# Patient Record
Sex: Female | Born: 1950 | Race: Black or African American | Hispanic: No | Smoking: Current every day smoker
Health system: Southern US, Community
[De-identification: ages and names within clinical notes are randomized; demographics above are authoritative.]

## PROBLEM LIST (undated history)

## (undated) DIAGNOSIS — Z720 Tobacco use: Secondary | ICD-10-CM

## (undated) DIAGNOSIS — F32A Depression, unspecified: Secondary | ICD-10-CM

## (undated) DIAGNOSIS — K219 Gastro-esophageal reflux disease without esophagitis: Secondary | ICD-10-CM

## (undated) DIAGNOSIS — F329 Major depressive disorder, single episode, unspecified: Secondary | ICD-10-CM

## (undated) DIAGNOSIS — E079 Disorder of thyroid, unspecified: Secondary | ICD-10-CM

## (undated) DIAGNOSIS — M1990 Unspecified osteoarthritis, unspecified site: Secondary | ICD-10-CM

## (undated) DIAGNOSIS — I10 Essential (primary) hypertension: Secondary | ICD-10-CM

## (undated) HISTORY — PX: EYE SURGERY: SHX253

## (undated) HISTORY — DX: Depression, unspecified: F32.A

## (undated) HISTORY — DX: Gastro-esophageal reflux disease without esophagitis: K21.9

## (undated) HISTORY — DX: Tobacco use: Z72.0

## (undated) HISTORY — DX: Major depressive disorder, single episode, unspecified: F32.9

## (undated) HISTORY — DX: Unspecified osteoarthritis, unspecified site: M19.90

---

## 1999-01-29 ENCOUNTER — Inpatient Hospital Stay (HOSPITAL_COMMUNITY): Admission: EM | Admit: 1999-01-29 | Discharge: 1999-02-02 | Payer: Self-pay | Admitting: Emergency Medicine

## 1999-02-22 ENCOUNTER — Emergency Department (HOSPITAL_COMMUNITY): Admission: EM | Admit: 1999-02-22 | Discharge: 1999-02-22 | Payer: Self-pay | Admitting: Emergency Medicine

## 1999-04-08 ENCOUNTER — Encounter: Admission: RE | Admit: 1999-04-08 | Discharge: 1999-04-28 | Payer: Self-pay | Admitting: *Deleted

## 1999-11-24 ENCOUNTER — Emergency Department (HOSPITAL_COMMUNITY): Admission: EM | Admit: 1999-11-24 | Discharge: 1999-11-24 | Payer: Self-pay | Admitting: Emergency Medicine

## 2003-07-22 ENCOUNTER — Inpatient Hospital Stay (HOSPITAL_COMMUNITY): Admission: AD | Admit: 2003-07-22 | Discharge: 2003-07-26 | Payer: Self-pay | Admitting: Psychiatry

## 2004-09-30 ENCOUNTER — Ambulatory Visit: Payer: Self-pay | Admitting: Internal Medicine

## 2004-10-01 ENCOUNTER — Ambulatory Visit: Payer: Self-pay | Admitting: Internal Medicine

## 2004-12-01 ENCOUNTER — Ambulatory Visit: Payer: Self-pay | Admitting: Family Medicine

## 2004-12-02 ENCOUNTER — Ambulatory Visit: Payer: Self-pay | Admitting: *Deleted

## 2005-01-07 ENCOUNTER — Ambulatory Visit: Payer: Self-pay | Admitting: Family Medicine

## 2005-01-19 ENCOUNTER — Ambulatory Visit (HOSPITAL_COMMUNITY): Admission: RE | Admit: 2005-01-19 | Discharge: 2005-01-19 | Payer: Self-pay | Admitting: Family Medicine

## 2005-02-11 ENCOUNTER — Ambulatory Visit: Payer: Self-pay | Admitting: Family Medicine

## 2005-02-15 ENCOUNTER — Ambulatory Visit: Payer: Self-pay | Admitting: Family Medicine

## 2005-06-20 ENCOUNTER — Ambulatory Visit: Payer: Self-pay | Admitting: Family Medicine

## 2006-01-02 ENCOUNTER — Ambulatory Visit: Payer: Self-pay | Admitting: Family Medicine

## 2006-01-02 ENCOUNTER — Ambulatory Visit (HOSPITAL_COMMUNITY): Admission: RE | Admit: 2006-01-02 | Discharge: 2006-01-02 | Payer: Self-pay | Admitting: Family Medicine

## 2006-01-03 ENCOUNTER — Ambulatory Visit: Payer: Self-pay | Admitting: Family Medicine

## 2006-01-11 ENCOUNTER — Ambulatory Visit: Payer: Self-pay | Admitting: Family Medicine

## 2006-01-13 ENCOUNTER — Ambulatory Visit: Payer: Self-pay | Admitting: Family Medicine

## 2006-01-23 ENCOUNTER — Ambulatory Visit: Payer: Self-pay | Admitting: Family Medicine

## 2006-04-11 ENCOUNTER — Ambulatory Visit: Payer: Self-pay | Admitting: Family Medicine

## 2006-04-25 ENCOUNTER — Ambulatory Visit: Payer: Self-pay | Admitting: Family Medicine

## 2006-07-25 ENCOUNTER — Ambulatory Visit: Payer: Self-pay | Admitting: Family Medicine

## 2006-07-25 ENCOUNTER — Encounter (INDEPENDENT_AMBULATORY_CARE_PROVIDER_SITE_OTHER): Payer: Self-pay | Admitting: Family Medicine

## 2006-08-15 ENCOUNTER — Ambulatory Visit (HOSPITAL_COMMUNITY): Admission: RE | Admit: 2006-08-15 | Discharge: 2006-08-15 | Payer: Self-pay | Admitting: Family Medicine

## 2007-04-21 ENCOUNTER — Encounter (INDEPENDENT_AMBULATORY_CARE_PROVIDER_SITE_OTHER): Payer: Self-pay | Admitting: Family Medicine

## 2007-04-21 DIAGNOSIS — E039 Hypothyroidism, unspecified: Secondary | ICD-10-CM

## 2007-04-21 DIAGNOSIS — F329 Major depressive disorder, single episode, unspecified: Secondary | ICD-10-CM

## 2007-04-21 DIAGNOSIS — J309 Allergic rhinitis, unspecified: Secondary | ICD-10-CM | POA: Insufficient documentation

## 2007-04-21 DIAGNOSIS — N951 Menopausal and female climacteric states: Secondary | ICD-10-CM

## 2007-05-28 ENCOUNTER — Ambulatory Visit: Payer: Self-pay | Admitting: Family Medicine

## 2007-07-23 ENCOUNTER — Ambulatory Visit: Payer: Self-pay | Admitting: Family Medicine

## 2007-08-14 ENCOUNTER — Ambulatory Visit: Payer: Self-pay | Admitting: Family Medicine

## 2007-08-29 ENCOUNTER — Encounter (INDEPENDENT_AMBULATORY_CARE_PROVIDER_SITE_OTHER): Payer: Self-pay | Admitting: *Deleted

## 2007-11-20 ENCOUNTER — Ambulatory Visit: Payer: Self-pay | Admitting: Family Medicine

## 2007-11-20 DIAGNOSIS — I739 Peripheral vascular disease, unspecified: Secondary | ICD-10-CM | POA: Insufficient documentation

## 2007-11-27 ENCOUNTER — Encounter (INDEPENDENT_AMBULATORY_CARE_PROVIDER_SITE_OTHER): Payer: Self-pay | Admitting: Family Medicine

## 2007-11-27 ENCOUNTER — Ambulatory Visit: Payer: Self-pay | Admitting: Surgery

## 2007-11-27 ENCOUNTER — Ambulatory Visit (HOSPITAL_COMMUNITY): Admission: RE | Admit: 2007-11-27 | Discharge: 2007-11-27 | Payer: Self-pay | Admitting: Family Medicine

## 2007-12-25 ENCOUNTER — Ambulatory Visit: Payer: Self-pay | Admitting: Family Medicine

## 2007-12-25 DIAGNOSIS — M79609 Pain in unspecified limb: Secondary | ICD-10-CM

## 2007-12-26 ENCOUNTER — Ambulatory Visit (HOSPITAL_COMMUNITY): Admission: RE | Admit: 2007-12-26 | Discharge: 2007-12-26 | Payer: Self-pay | Admitting: Family Medicine

## 2007-12-26 ENCOUNTER — Encounter (INDEPENDENT_AMBULATORY_CARE_PROVIDER_SITE_OTHER): Payer: Self-pay | Admitting: Family Medicine

## 2007-12-26 ENCOUNTER — Ambulatory Visit: Payer: Self-pay | Admitting: Vascular Surgery

## 2007-12-26 ENCOUNTER — Encounter (INDEPENDENT_AMBULATORY_CARE_PROVIDER_SITE_OTHER): Payer: Self-pay | Admitting: Ophthalmology

## 2008-01-02 ENCOUNTER — Ambulatory Visit: Payer: Self-pay | Admitting: Internal Medicine

## 2008-01-02 ENCOUNTER — Encounter (INDEPENDENT_AMBULATORY_CARE_PROVIDER_SITE_OTHER): Payer: Self-pay | Admitting: Family Medicine

## 2008-02-19 ENCOUNTER — Ambulatory Visit: Payer: Self-pay | Admitting: Family Medicine

## 2008-02-25 ENCOUNTER — Ambulatory Visit (HOSPITAL_COMMUNITY): Admission: RE | Admit: 2008-02-25 | Discharge: 2008-02-25 | Payer: Self-pay | Admitting: Family Medicine

## 2008-03-05 ENCOUNTER — Ambulatory Visit: Payer: Self-pay | Admitting: Nurse Practitioner

## 2008-04-01 ENCOUNTER — Emergency Department (HOSPITAL_COMMUNITY): Admission: EM | Admit: 2008-04-01 | Discharge: 2008-04-02 | Payer: Self-pay | Admitting: Emergency Medicine

## 2008-04-23 ENCOUNTER — Ambulatory Visit: Payer: Self-pay | Admitting: Nurse Practitioner

## 2008-04-23 DIAGNOSIS — I10 Essential (primary) hypertension: Secondary | ICD-10-CM

## 2008-04-23 DIAGNOSIS — R519 Headache, unspecified: Secondary | ICD-10-CM | POA: Insufficient documentation

## 2008-04-23 DIAGNOSIS — R51 Headache: Secondary | ICD-10-CM

## 2008-04-25 ENCOUNTER — Ambulatory Visit: Payer: Self-pay | Admitting: Family Medicine

## 2008-04-30 ENCOUNTER — Telehealth (INDEPENDENT_AMBULATORY_CARE_PROVIDER_SITE_OTHER): Payer: Self-pay | Admitting: Family Medicine

## 2008-06-19 ENCOUNTER — Ambulatory Visit: Payer: Self-pay | Admitting: Nurse Practitioner

## 2008-06-20 LAB — CONVERTED CEMR LAB
ALT: 38 units/L — ABNORMAL HIGH (ref 0–35)
Albumin: 4.3 g/dL (ref 3.5–5.2)
BUN: 15 mg/dL (ref 6–23)
Basophils Absolute: 0 10*3/uL (ref 0.0–0.1)
Calcium: 8.7 mg/dL (ref 8.4–10.5)
Creatinine, Ser: 0.84 mg/dL (ref 0.40–1.20)
Lymphocytes Relative: 33 % (ref 12–46)
Lymphs Abs: 2.5 10*3/uL (ref 0.7–4.0)
MCHC: 32.2 g/dL (ref 30.0–36.0)
Monocytes Absolute: 0.8 10*3/uL (ref 0.1–1.0)
Monocytes Relative: 11 % (ref 3–12)
Neutro Abs: 4.1 10*3/uL (ref 1.7–7.7)
Neutrophils Relative %: 54 % (ref 43–77)
Platelets: 161 10*3/uL (ref 150–400)
Potassium: 4.2 meq/L (ref 3.5–5.3)
RBC: 4.06 M/uL (ref 3.87–5.11)
TSH: 0.728 microintl units/mL (ref 0.350–4.50)
Total Bilirubin: 0.4 mg/dL (ref 0.3–1.2)
Total Protein: 7.3 g/dL (ref 6.0–8.3)

## 2008-09-01 ENCOUNTER — Ambulatory Visit: Payer: Self-pay | Admitting: Nurse Practitioner

## 2008-09-01 DIAGNOSIS — M545 Low back pain, unspecified: Secondary | ICD-10-CM | POA: Insufficient documentation

## 2008-09-05 ENCOUNTER — Encounter (INDEPENDENT_AMBULATORY_CARE_PROVIDER_SITE_OTHER): Payer: Self-pay | Admitting: Family Medicine

## 2008-10-06 ENCOUNTER — Ambulatory Visit (HOSPITAL_COMMUNITY): Admission: RE | Admit: 2008-10-06 | Discharge: 2008-10-06 | Payer: Self-pay | Admitting: Family Medicine

## 2008-10-08 ENCOUNTER — Ambulatory Visit: Payer: Self-pay | Admitting: Nurse Practitioner

## 2008-10-08 DIAGNOSIS — D239 Other benign neoplasm of skin, unspecified: Secondary | ICD-10-CM | POA: Insufficient documentation

## 2008-10-28 ENCOUNTER — Telehealth (INDEPENDENT_AMBULATORY_CARE_PROVIDER_SITE_OTHER): Payer: Self-pay | Admitting: Nurse Practitioner

## 2008-11-11 ENCOUNTER — Ambulatory Visit: Payer: Self-pay | Admitting: Family Medicine

## 2008-12-08 ENCOUNTER — Encounter (INDEPENDENT_AMBULATORY_CARE_PROVIDER_SITE_OTHER): Payer: Self-pay | Admitting: Nurse Practitioner

## 2008-12-08 ENCOUNTER — Telehealth (INDEPENDENT_AMBULATORY_CARE_PROVIDER_SITE_OTHER): Payer: Self-pay | Admitting: *Deleted

## 2008-12-08 ENCOUNTER — Ambulatory Visit: Payer: Self-pay | Admitting: Family Medicine

## 2008-12-08 DIAGNOSIS — J069 Acute upper respiratory infection, unspecified: Secondary | ICD-10-CM | POA: Insufficient documentation

## 2008-12-08 DIAGNOSIS — M940 Chondrocostal junction syndrome [Tietze]: Secondary | ICD-10-CM | POA: Insufficient documentation

## 2008-12-22 ENCOUNTER — Telehealth (INDEPENDENT_AMBULATORY_CARE_PROVIDER_SITE_OTHER): Payer: Self-pay | Admitting: Family Medicine

## 2008-12-23 ENCOUNTER — Ambulatory Visit: Payer: Self-pay | Admitting: Family Medicine

## 2008-12-23 ENCOUNTER — Telehealth (INDEPENDENT_AMBULATORY_CARE_PROVIDER_SITE_OTHER): Payer: Self-pay | Admitting: *Deleted

## 2008-12-23 DIAGNOSIS — J209 Acute bronchitis, unspecified: Secondary | ICD-10-CM

## 2008-12-23 DIAGNOSIS — B351 Tinea unguium: Secondary | ICD-10-CM

## 2008-12-25 ENCOUNTER — Ambulatory Visit: Payer: Self-pay | Admitting: Internal Medicine

## 2008-12-25 ENCOUNTER — Ambulatory Visit: Payer: Self-pay | Admitting: Cardiology

## 2008-12-25 ENCOUNTER — Inpatient Hospital Stay (HOSPITAL_COMMUNITY): Admission: EM | Admit: 2008-12-25 | Discharge: 2008-12-26 | Payer: Self-pay | Admitting: Gastroenterology

## 2008-12-26 ENCOUNTER — Encounter (INDEPENDENT_AMBULATORY_CARE_PROVIDER_SITE_OTHER): Payer: Self-pay | Admitting: Internal Medicine

## 2009-02-13 ENCOUNTER — Telehealth (INDEPENDENT_AMBULATORY_CARE_PROVIDER_SITE_OTHER): Payer: Self-pay | Admitting: Family Medicine

## 2009-03-02 ENCOUNTER — Ambulatory Visit: Payer: Self-pay | Admitting: Family Medicine

## 2009-06-24 ENCOUNTER — Encounter (INDEPENDENT_AMBULATORY_CARE_PROVIDER_SITE_OTHER): Payer: Self-pay | Admitting: *Deleted

## 2009-07-06 ENCOUNTER — Ambulatory Visit: Payer: Self-pay | Admitting: Physician Assistant

## 2009-07-06 DIAGNOSIS — M25519 Pain in unspecified shoulder: Secondary | ICD-10-CM | POA: Insufficient documentation

## 2009-07-07 ENCOUNTER — Ambulatory Visit (HOSPITAL_COMMUNITY): Admission: RE | Admit: 2009-07-07 | Discharge: 2009-07-07 | Payer: Self-pay | Admitting: Physician Assistant

## 2009-10-21 ENCOUNTER — Telehealth: Payer: Self-pay | Admitting: Physician Assistant

## 2009-11-04 ENCOUNTER — Ambulatory Visit: Payer: Self-pay | Admitting: Physician Assistant

## 2009-11-04 DIAGNOSIS — L738 Other specified follicular disorders: Secondary | ICD-10-CM | POA: Insufficient documentation

## 2009-11-04 LAB — CONVERTED CEMR LAB
CO2: 25 meq/L (ref 19–32)
Chloride: 108 meq/L (ref 96–112)
Potassium: 4.3 meq/L (ref 3.5–5.3)
Sodium: 144 meq/L (ref 135–145)

## 2009-11-10 ENCOUNTER — Encounter: Payer: Self-pay | Admitting: Physician Assistant

## 2009-11-10 ENCOUNTER — Telehealth: Payer: Self-pay | Admitting: Physician Assistant

## 2009-11-13 ENCOUNTER — Ambulatory Visit (HOSPITAL_COMMUNITY): Admission: RE | Admit: 2009-11-13 | Discharge: 2009-11-13 | Payer: Self-pay | Admitting: Internal Medicine

## 2009-11-23 ENCOUNTER — Encounter: Admission: RE | Admit: 2009-11-23 | Discharge: 2009-11-23 | Payer: Self-pay | Admitting: Internal Medicine

## 2009-11-23 ENCOUNTER — Encounter: Payer: Self-pay | Admitting: Physician Assistant

## 2009-12-14 ENCOUNTER — Encounter: Payer: Self-pay | Admitting: Physician Assistant

## 2009-12-14 ENCOUNTER — Encounter: Admission: RE | Admit: 2009-12-14 | Discharge: 2010-01-20 | Payer: Self-pay | Admitting: Physician Assistant

## 2010-01-06 ENCOUNTER — Encounter: Payer: Self-pay | Admitting: Physician Assistant

## 2010-01-20 ENCOUNTER — Encounter: Payer: Self-pay | Admitting: Physician Assistant

## 2010-01-22 ENCOUNTER — Encounter: Payer: Self-pay | Admitting: Physician Assistant

## 2010-02-26 ENCOUNTER — Telehealth: Payer: Self-pay | Admitting: Physician Assistant

## 2010-02-26 ENCOUNTER — Ambulatory Visit: Payer: Self-pay | Admitting: Physician Assistant

## 2010-02-26 DIAGNOSIS — R109 Unspecified abdominal pain: Secondary | ICD-10-CM | POA: Insufficient documentation

## 2010-02-26 LAB — CONVERTED CEMR LAB
Glucose, Urine, Semiquant: NEGATIVE
Ketones, urine, test strip: NEGATIVE
Specific Gravity, Urine: >=1.03
Urobilinogen, UA: 0.2
WBC Urine, dipstick: NEGATIVE
pH: 6

## 2010-02-27 LAB — CONVERTED CEMR LAB
ALT: 9 units/L (ref 0–35)
AST: 14 units/L (ref 0–37)
BUN: 18 mg/dL (ref 6–23)
Basophils Relative: 0 % (ref 0–1)
Calcium: 10.1 mg/dL (ref 8.4–10.5)
Creatinine, Ser: 1.05 mg/dL (ref 0.40–1.20)
Eosinophils Relative: 2 % (ref 0–5)
Glucose, Bld: 79 mg/dL (ref 70–99)
HCT: 44.5 % (ref 36.0–46.0)
Hemoglobin: 14.5 g/dL (ref 12.0–15.0)
Lymphocytes Relative: 46 % (ref 12–46)
Lymphs Abs: 3.8 10*3/uL (ref 0.7–4.0)
MCHC: 32.6 g/dL (ref 30.0–36.0)
Potassium: 4.9 meq/L (ref 3.5–5.3)
RBC: 4.21 M/uL (ref 3.87–5.11)
RDW: 13.6 % (ref 11.5–15.5)
Total Protein: 8 g/dL (ref 6.0–8.3)
WBC: 8.3 10*3/uL (ref 4.0–10.5)

## 2010-03-01 ENCOUNTER — Ambulatory Visit (HOSPITAL_COMMUNITY): Admission: RE | Admit: 2010-03-01 | Discharge: 2010-03-01 | Payer: Self-pay | Admitting: Internal Medicine

## 2010-03-01 ENCOUNTER — Telehealth: Payer: Self-pay | Admitting: Physician Assistant

## 2010-03-02 ENCOUNTER — Encounter: Payer: Self-pay | Admitting: Physician Assistant

## 2010-03-02 ENCOUNTER — Encounter (INDEPENDENT_AMBULATORY_CARE_PROVIDER_SITE_OTHER): Payer: Self-pay | Admitting: *Deleted

## 2010-03-02 LAB — CONVERTED CEMR LAB
Folate: 6.3 ng/mL
Vitamin B-12: 333 pg/mL (ref 211–911)

## 2010-03-04 ENCOUNTER — Encounter: Payer: Self-pay | Admitting: Physician Assistant

## 2010-04-15 ENCOUNTER — Telehealth: Payer: Self-pay | Admitting: Physician Assistant

## 2010-06-22 ENCOUNTER — Telehealth: Payer: Self-pay | Admitting: Physician Assistant

## 2010-06-28 ENCOUNTER — Encounter (INDEPENDENT_AMBULATORY_CARE_PROVIDER_SITE_OTHER): Payer: Self-pay | Admitting: *Deleted

## 2010-07-12 ENCOUNTER — Ambulatory Visit: Payer: Self-pay | Admitting: Physician Assistant

## 2010-07-12 ENCOUNTER — Telehealth: Payer: Self-pay | Admitting: Physician Assistant

## 2010-07-12 LAB — CONVERTED CEMR LAB: Calcium: 10.1 mg/dL (ref 8.4–10.5)

## 2010-07-13 ENCOUNTER — Encounter: Payer: Self-pay | Admitting: Physician Assistant

## 2010-07-13 ENCOUNTER — Telehealth: Payer: Self-pay | Admitting: Physician Assistant

## 2010-07-28 ENCOUNTER — Encounter (INDEPENDENT_AMBULATORY_CARE_PROVIDER_SITE_OTHER): Payer: Self-pay | Admitting: *Deleted

## 2010-07-28 ENCOUNTER — Ambulatory Visit: Payer: Self-pay | Admitting: Sports Medicine

## 2010-07-28 DIAGNOSIS — M719 Bursopathy, unspecified: Secondary | ICD-10-CM

## 2010-07-28 DIAGNOSIS — M67919 Unspecified disorder of synovium and tendon, unspecified shoulder: Secondary | ICD-10-CM | POA: Insufficient documentation

## 2010-09-01 ENCOUNTER — Encounter: Payer: Self-pay | Admitting: Physician Assistant

## 2011-01-02 ENCOUNTER — Encounter: Payer: Self-pay | Admitting: Occupational Therapy

## 2011-01-11 NOTE — Progress Notes (Signed)
Summary: [facility] Medicine [facility] referral   Phone Note Outgoing Call   Summary of Call: Needs referral to [facility] Medicine Clinic for chronic left shoulder pain. Initial call taken by: Brynda Rim,  July 12, 2010 5:52 PM  Follow-up for Phone Call        PT HAVE AN APPT [facility] SPORTS MEDICINE 07-19-10 @ 2:30PM DR LEE  PH # [phone] ADDRESS [address] . LVM TO PT 2 CALL ME BACK. Follow-up by: Cheryll Dessert,  July 16, 2010 10:06 AM

## 2011-01-11 NOTE — Letter (Signed)
Summary: *Consult Note  [facility]  [address]   [place], [place] [address]   Phone: [phone]  Fax: [phone]    Re:    [name] DOB:    02/28/1951   Dear: Tereso Newcomer   Thank you for requesting that we see the above patient for consultation.  A copy of the detailed office note will be sent under separate cover, for your review.  Evaluation today is consistent with: Rotator Cuff syndrome   Our recommendation is for: subacromial cortisone injection performed today, referral for PT.   New Orders include:    New Medications started today include:    After today's visit, the patients current medications include: 1)  LEVOTHYROXINE SODIUM 125 MCG TABS (LEVOTHYROXINE SODIUM) One tablet by mouth daily for thyroid 2)  BENZONATATE 100 MG  CAPS (BENZONATATE) 1 capsule by mouth two times a day as needed for cough 3)  ALLEGRA 180 MG TABS (FEXOFENADINE HCL) 1 tablet by mouth daily as needed for allergies 4)  VERAMYST 27.5 MCG/SPRAY SUSP (FLUTICASONE FUROATE) 1 spray in each nostril two times a day 5)  LISINOPRIL 10 MG  TABS (LISINOPRIL) Take 1 tab by mouth each day 6)  VISTARIL 25 MG CAPS (HYDROXYZINE PAMOATE) Take 1 capsule by mouth every 8 hours for runnynose and allergy 7)  IBUPROFEN 800 MG TABS (IBUPROFEN) Take 1 tablet by mouth three times a day with food as needed for pain 8)  FLEXERIL 10 MG TABS (CYCLOBENZAPRINE HCL) Take 1 tablet by mouth three times a day as needed for shoulder pain 9)  NASACORT AQ 55 MCG/ACT AERS (TRIAMCINOLONE ACETONIDE(NASAL)) 1 spray each nostril once daily 10)  CIPRO 500 MG TABS (CIPROFLOXACIN HCL) Take 1 tablet by mouth two times a day 11)  TRAMADOL HCL 50 MG TABS (TRAMADOL HCL) Take 1 by mouth two times a day as needed for severe pain   Thank you for this consultation.  If you have any further questions regarding the care of this patient, please do not hesitate to contact me @ [phone]  Thank you for this  opportunity to look after your patient.  Sincerely,   Corbin Ade MD Sports Medicine Fellow

## 2011-01-11 NOTE — Letter (Signed)
Summary: *HSN Results Follow up  [facility]  [address] [place], [place] [address]   Phone: [phone]  Fax: [phone]      07/13/2010   Jonelle Sports [address] [place], [place]  [address]   Dear  Ms. Keturah Barre,                            ____S.Drinkard,FNP   ____D. Gore,FNP       ____B. McPherson,MD   ____V. Rankins,MD    ____E. Mulberry,MD    ____N. Daphine Deutscher, FNP  ____D. Reche Dixon, MD    ____K. Philipp Deputy, MD    __x_S. Alben Spittle, PA-C     This letter is to inform you that your recent test(s):  _______Pap Smear    ___x____Lab Test     _______X-ray    ___x____ is within acceptable limits  _______ requires a medication change  _______ requires a follow-up lab visit  _______ requires a follow-up visit with your provider   Comments:  I want to apologize for your wait yesterday.  I appreciate your patience. Please call me if you do not get a call about your referral for your shoulder in the next 2 weeks.       _________________________________________________________ If you have any questions, please contact our office                     Sincerely,  Tereso Newcomer PA-C [facility]

## 2011-01-11 NOTE — Progress Notes (Signed)
Summary: NEEDS REFILL ON COUGH MEDS   Phone Note Call from Patient Call back at [phone]   Reason for Call: Refill Medication Summary of Call: [name] PT. [name] SAYS THAT SHE STILL HAS THE COUGH, AND THE MEDICIINE THAT YOU PRESCRIBED FOR THE COUGH DID HELP, BUT SHE NEEDS ANOTHER REFILL.  SHE USE [facility]. Initial call taken by: Leodis Rains,  Apr 15, 2010 12:27 PM  Follow-up for Phone Call        forward to provider Follow-up by: [place] Shannon,  Apr 15, 2010 4:16 PM  Additional Follow-up for Phone Call Additional follow up Details #1::        She needs an appt to evaluate her cough. I see that she has been getting cough meds for several months. No more refills until she is evaluated for her cough. Additional Follow-up by: Tereso Newcomer PA-C,  Apr 15, 2010 5:17 PM    Additional Follow-up for Phone Call Additional follow up Details #2::    pt is aware and has appt Follow-up by: [place] Shannon,  Apr 16, 2010 9:10 AM  Prescriptions: BENZONATATE 100 MG  CAPS (BENZONATATE) 1 capsule by mouth two times a day as needed for cough  #30 x 0   Entered and Authorized by:   Tereso Newcomer PA-C   Signed by:   Tereso Newcomer PA-C on 04/15/2010   Method used:   Faxed to ...       [facility] (retail)       [address] [place], [place]  [address]       Ph: [phone]       Fax: [phone]   RxID:   [id number]

## 2011-01-11 NOTE — Miscellaneous (Signed)
   Clinical Lists Changes  Observations: Added new observation of PAST MED HX: Allergic rhinitis Depression Hypothyroidism Postmenopausal symptoms(07/25/06) NEVUS (ICD-216.9)....[facility]. 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  PT initiated in 12/2009   b.  d/c 2.9.2011 - never followed up   (01/22/2010 16:30)       Past History:  Past Medical History: Allergic rhinitis Depression Hypothyroidism Postmenopausal symptoms(07/25/06) NEVUS (ICD-216.9)....[facility]. 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  PT initiated in 12/2009   b.  d/c 2.9.2011 - never followed up

## 2011-01-11 NOTE — Letter (Signed)
Summary: REFERRAL/SPORTS MEDICINE  REFERRAL/SPORTS MEDICINE   Imported By: Arta Bruce 08/04/2010 15:07:35  _____________________________________________________________________  External Attachment:    Type:   Image     Comment:   External Document
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
## 2011-01-11 NOTE — Miscellaneous (Signed)
Summary: Rehab Report//PROGRESS REPORT  Rehab Report//PROGRESS REPORT   Imported By: Arta Bruce 02/18/2010 14:22:12  _____________________________________________________________________  External Attachment:    Type:   Image     Comment:   External Document

## 2011-01-11 NOTE — Progress Notes (Signed)
   Phone Note Call from Patient Call back at Home Phone [phone]   Summary of Call: Since last 3 and 4 days, the pt has a terrible pain in her left side and she wants to know if she can see Scottt by Monday because she cannot come today, although i offered something with Daphine Deutscher in the afternoon but she is unable to come in the afternoon because she has another appointment somewhere else.  She is taking aleve for the pain but is not helping her.  Alben Spittle PA-c Initial call taken by: Manon Hilding,  February 26, 2010 8:20 AM  Follow-up for Phone Call        Left message on answering machine for pt to call back....Marland KitchenMarland KitchenArmenia Shannon  February 26, 2010 10:34 AM  pt coming in Follow-up by: Arm[place] Shannon,  February 26, 2010 10:57 AM

## 2011-01-11 NOTE — Assessment & Plan Note (Signed)
Summary: Patient left. . . never seen   Allergies: No Known Drug Allergies   Complete Medication List: 1)  Synthroid 125 Mcg Tabs (Levothyroxine sodium) .Marland Kitchen.. 1 by mouth once daily 2)  Benzonatate 100 Mg Caps (Benzonatate) .Marland Kitchen.. 1 capsule by mouth two times a day as needed for cough 3)  Allegra 180 Mg Tabs (Fexofenadine hcl) .Marland Kitchen.. 1 tablet by mouth daily for allergies 4)  Veramyst 27.5 Mcg/spray Susp (Fluticasone furoate) .Marland Kitchen.. 1 spray in each nostril two times a day 5)  Lisinopril 10 Mg Tabs (Lisinopril) .... Take 1 tab by mouth each am(strted by outside md when in hosp) 6)  Vistaril 25 Mg Caps (Hydroxyzine pamoate) .... Take 1 capsule by mouth every 8 hours for runnynose and allergy 7)  Diclofenac Sodium 75 Mg Tbec (Diclofenac sodium) .... Take 1 tablet by mouth two times a day as needed for pain 8)  Flexeril 10 Mg Tabs (Cyclobenzaprine hcl) .... Take 1 tablet by mouth three times a day as needed for shoulder pain 9)  Nasacort Aq 55 Mcg/act Aers (Triamcinolone acetonide(nasal)) .Marland Kitchen.. 1 spray each nostril once daily 10)  Cipro 500 Mg Tabs (Ciprofloxacin hcl) .... Take 1 tablet by mouth two times a day 11)  Flagyl 500 Mg Tabs (Metronidazole) .... Take 1 tablet by mouth four times a day

## 2011-01-11 NOTE — Letter (Signed)
Summary: [facility] PT Referral Form  [facility] PT Referral Form   Imported By: Marily Memos 07/28/2010 15:10:08  _____________________________________________________________________  External Attachment:    Type:   Image     Comment:   External Document

## 2011-01-11 NOTE — Letter (Signed)
Summary: *HSN Results Follow up  [facility]  [address] [place], [place] [address]   Phone: [phone]  Fax: [phone]      06/28/2010   Jonelle Sports [address] [place], [place]  [address]   Dear  Ms. Keturah Barre,                            ____S.Drinkard,FNP   ____D. Gore,FNP       ____B. McPherson,MD   ____V. Rankins,MD    ____E. Mulberry,MD    ____N. Daphine Deutscher, FNP  ____D. Reche Dixon, MD    ____K. Philipp Deputy, MD    ____Other     This letter is to inform you that your recent test(s):  _______Pap Smear    _______Lab Test     _______X-ray    _______ is within acceptable limits  ___X____ requires a medication change  ___X____ requires a follow-up lab visit  _______ requires a follow-up visit with your provider   Comments:We have been trying to reach you.  Please give the office a call at your earliest convenience       _________________________________________________________ If you have any questions, please contact our office                     Sincerely,  Armenia Shannon [facility]

## 2011-01-11 NOTE — Letter (Signed)
Summary: *HSN Results Follow up  [facility]  [address] [place], [place] [address]   Phone: [phone]  Fax: [phone]      03/02/2010   EZELL MELIKIAN [address] [place], [place]  [address]   Dear  Ms. Keturah Barre,                            ____S.Drinkard,FNP   ____D. Gore,FNP       ____B. McPherson,MD   ____V. Rankins,MD    ____E. Mulberry,MD    ____N. Daphine Deutscher, FNP  ____D. Reche Dixon, MD    ____K. Philipp Deputy, MD    ____Other     This letter is to inform you that your recent test(s):  _______Pap Smear    ___X____Lab Test     _______X-ray    __X_____ is within acceptable limits  _______ requires a medication change  _______ requires a follow-up lab visit  _______ requires a follow-up visit with your provider   Comments:       _________________________________________________________ If you have any questions, please contact our office                     Sincerely,  [place] Shannon [facility]

## 2011-01-11 NOTE — Progress Notes (Signed)
Summary: MISPLACED RX FROM YESTERDAY   Phone Note Call from Patient Call back at [facility] Phone [phone]   Summary of Call: weaer pt.  MS Bumgardner WANTS [place] TO CALL IN HER MEDS THAT SCOTT PRESCRIBED YESTERDAY. SHE MISPLACED THE RX.Marland Kitchen CALL THEM INTO [facility]. Initial call taken by: Leodis Rains,  July 13, 2010 12:38 PM  Follow-up for Phone Call        pt is aware Follow-up by: Arm[place] Shannon,  July 13, 2010 12:51 PM

## 2011-01-11 NOTE — Miscellaneous (Signed)
  Clinical Lists Changes  Observations: Added new observation of PAST MED HX: Allergic rhinitis Depression Hypothyroidism Postmenopausal symptoms(07/25/06) NEVUS (ICD-216.9)....[facility]. 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  PT initiated in 12/2009   b.  d/c 2.9.2011 - never followed up   c.  [facility]:  L Rotator Cuff Syndrome; s/p steroid injection; PT reinitiated  (09/01/2010 9:10)       Past History:  Past Medical History: Allergic rhinitis Depression Hypothyroidism Postmenopausal symptoms(07/25/06) NEVUS (ICD-216.9)....[facility]/2008. 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  PT initiated in 12/2009   b.  d/c 2.9.2011 - never followed up   c.  [facility] 07[facility]:  L Rotator Cuff Syndrome; s/p steroid injection; PT reinitiated

## 2011-01-11 NOTE — Letter (Signed)
Summary: REFERRAL/PHYSICAL THERAPY  REFERRAL/PHYSICAL THERAPY   Imported By: Arta Bruce 01/19/2010 15:05:22  _____________________________________________________________________  External Attachment:    Type:   Image     Comment:   External Document

## 2011-01-11 NOTE — Assessment & Plan Note (Signed)
Summary: HTN and hypothyroidism   Vital Signs:  Patient profile:   60 year old female Weight:      185 pounds BMI:     30.43 Temp:     98.2 degrees F oral Pulse rate:   74 / minute Pulse rhythm:   regular Resp:     18 per minute BP sitting:   118 / 77  (right arm) Cuff size:   large  Vitals Entered By: Armenia Shannon (July 12, 2010 9:42 AM) CC: follow-up visit blood pressure, Patient currently having left shoulder pain, pain travel from shoulder down the complete arm., Hypertension Management Is Patient Diabetic? No Pain Assessment Patient in pain? yes     Location: shoulder Intensity: 8 Type: aching Onset of pain  Constant  Does patient need assistance? Functional Status Self care Ambulation Normal   Primary Care Provider:  Tereso Newcomer PA-C  CC:  follow-up visit blood pressure, Patient currently having left shoulder pain, pain travel from shoulder down the complete arm., and Hypertension Management.  History of Present Illness: Here for follow up on BP and thyroid.   Meds for thyroid changed in July.  Needs f/u on labs today.  Left shoulder:  Went to PT several times but stopped going.  Still having pain.  Diclofenac was of no help.  Taking Aleve several times a day.    Hypertension History:      She denies headache, chest pain, dyspnea with exertion, and syncope.        Positive major cardiovascular risk factors include female age 74 years old or older, hypertension, and current tobacco user.  Negative major cardiovascular risk factors include negative family history for ischemic heart disease.        Positive history for target organ damage include peripheral vascular disease.     Current Medications (verified): 1)  Levothyroxine Sodium 125 Mcg Tabs (Levothyroxine Sodium) .... One Tablet By Mouth Daily For Thyroid 2)  Benzonatate 100 Mg  Caps (Benzonatate) .Marland Kitchen.. 1 Capsule By Mouth Two Times A Day As Needed For Cough 3)  Allegra 180 Mg Tabs (Fexofenadine Hcl)  .Marland Kitchen.. 1 Tablet By Mouth Daily As Needed For Allergies 4)  Veramyst 27.5 Mcg/spray Susp (Fluticasone Furoate) .Marland Kitchen.. 1 Spray in Each Nostril Two Times A Day 5)  Lisinopril 10 Mg  Tabs (Lisinopril) .... Take 1 Tab By Mouth Each Day 6)  Vistaril 25 Mg Caps (Hydroxyzine Pamoate) .... Take 1 Capsule By Mouth Every 8 Hours For Runnynose and Allergy 7)  Diclofenac Sodium 75 Mg Tbec (Diclofenac Sodium) .... Take 1 Tablet By Mouth Two Times A Day As Needed For Pain 8)  Flexeril 10 Mg Tabs (Cyclobenzaprine Hcl) .... Take 1 Tablet By Mouth Three Times A Day As Needed For Shoulder Pain 9)  Nasacort Aq 55 Mcg/act Aers (Triamcinolone Acetonide(Nasal)) .Marland Kitchen.. 1 Spray Each Nostril Once Daily 10)  Cipro 500 Mg Tabs (Ciprofloxacin Hcl) .... Take 1 Tablet By Mouth Two Times A Day  Allergies (verified): No Known Drug Allergies  Social History: Reviewed history from 07/06/2009 and no changes required. Current Smoker  Physical Exam  General:  alert, well-developed, and well-nourished.   Head:  normocephalic and atraumatic.   Neck:  no thyromegaly, no JVD, no carotid bruits, and no cervical lymphadenopathy.   Lungs:  normal breath sounds, no crackles, and no wheezes.   Heart:  normal rate and regular rhythm.   Abdomen:  soft, non-tender, and no hepatomegaly.   Msk:  left shoulder: full AROM no crepitus +  pain over biceps tendon + pain over AC joint empty can test neg Neurologic:  alert & oriented X3 and cranial nerves II-XII intact.   Psych:  normally interactive.     Impression & Recommendations:  Problem # 1:  HYPERTENSION, BENIGN ESSENTIAL (ICD-401.1) controlled  Her updated medication list for this problem includes:    Lisinopril 10 Mg Tabs (Lisinopril) .Marland Kitchen... Take 1 tab by mouth each day  Orders: T-Basic Metabolic Panel [phone])  Problem # 2:  HYPOTHYROIDISM (ICD-244.9)  Her updated medication list for this problem includes:    Levothyroxine Sodium 125 Mcg Tabs (Levothyroxine sodium)  ..... One tablet by mouth daily for thyroid  Orders: T-TSH ([id number])  Problem # 3:  SHOULDER PAIN, LEFT (ICD-719.41)  refer to sports med clinic change to ibuprofen tramadol as needed  Her updated medication list for this problem includes:    Ibuprofen 800 Mg Tabs (Ibuprofen) .Marland Kitchen... Take 1 tablet by mouth three times a day with food as needed for pain    Flexeril 10 Mg Tabs (Cyclobenzaprine hcl) .Marland Kitchen... Take 1 tablet by mouth three times a day as needed for shoulder pain    Tramadol Hcl 50 Mg Tabs (Tramadol hcl) .Marland Kitchen... Take 1 by mouth two times a day as needed for severe pain  Orders: Sports Medicine (Sports Med)  Complete Medication List: 1)  Levothyroxine Sodium 125 Mcg Tabs (Levothyroxine sodium) .... One tablet by mouth daily for thyroid 2)  Benzonatate 100 Mg Caps (Benzonatate) .Marland Kitchen.. 1 capsule by mouth two times a day as needed for cough 3)  Allegra 180 Mg Tabs (Fexofenadine hcl) .Marland Kitchen.. 1 tablet by mouth daily as needed for allergies 4)  Veramyst 27.5 Mcg/spray Susp (Fluticasone furoate) .Marland Kitchen.. 1 spray in each nostril two times a day 5)  Lisinopril 10 Mg Tabs (Lisinopril) .... Take 1 tab by mouth each day 6)  Vistaril 25 Mg Caps (Hydroxyzine pamoate) .... Take 1 capsule by mouth every 8 hours for runnynose and allergy 7)  Ibuprofen 800 Mg Tabs (Ibuprofen) .... Take 1 tablet by mouth three times a day with food as needed for pain 8)  Flexeril 10 Mg Tabs (Cyclobenzaprine hcl) .... Take 1 tablet by mouth three times a day as needed for shoulder pain 9)  Nasacort Aq 55 Mcg/act Aers (Triamcinolone acetonide(nasal)) .Marland Kitchen.. 1 spray each nostril once daily 10)  Cipro 500 Mg Tabs (Ciprofloxacin hcl) .... Take 1 tablet by mouth two times a day 11)  Tramadol Hcl 50 Mg Tabs (Tramadol hcl) .... Take 1 by mouth two times a day as needed for severe pain  Hypertension Assessment/Plan:      The patient's hypertensive risk group is category C: Target organ damage and/or diabetes.  Today's blood pressure  is 118/77.  Her blood pressure goal is < 140/90.  Patient Instructions: 1)  Please schedule a follow-up appointment in 4 months with Faelyn Sigler for CPP.  Come fasting.  Schedule first appointment of the day. 2)  Someon should call you about the sports medicine clinic for your shoulder.  Call me if you do not get a call in 2 weeks. 3)  Take the Ibuprofen every 8 hours as needed with food.  DO not take more and do not take with Aleve, Advil, Motrin.  These are the same medicines. 4)  You can take tylenol (Acetaminophen) 500 mg 1-2 tabs by mouth every 6 hours as needed. 5)  Only take the Tramadol as needed for severe pain. 6)    Prescriptions: TRAMADOL HCL 50 MG  TABS (TRAMADOL HCL) Take 1 by mouth two times a day as needed for severe pain  #30 x 0   Entered and Authorized by:   Tereso Newcomer PA-C   Signed by:   Tereso Newcomer PA-C on 07/12/2010   Method used:   Print then Give to Patient   RxID:   [id number] IBUPROFEN 800 MG TABS (IBUPROFEN) Take 1 tablet by mouth three times a day with food as needed for pain  #60 x 2   Entered and Authorized by:   Tereso Newcomer PA-C   Signed by:   Tereso Newcomer PA-C on 07/12/2010   Method used:   Print then Give to Patient   RxID:   [id number]

## 2011-01-11 NOTE — Miscellaneous (Signed)
Summary: Rehab Report//DISCHARGE SUMMARY  Rehab Report//DISCHARGE SUMMARY   Imported By: Arta Bruce 03/22/2010 15:37:22  _____________________________________________________________________  External Attachment:    Type:   Image     Comment:   External Document

## 2011-01-11 NOTE — Assessment & Plan Note (Signed)
Summary: PAIN IN SIDE///KT   Vital Signs:  Patient profile:   60 year old female Height:      65.5 inches Weight:      184 pounds BMI:     30.26 Temp:     97.4 degrees F oral Pulse rate:   96 / minute Pulse rhythm:   regular Resp:     18 per minute BP sitting:   124 / 79  (left arm) Cuff size:   regular  Vitals Entered By: Armenia Shannon (February 26, 2010 11:42 AM) CC: pt needs reill on synthyroid.... pt says the pain started [place]... pt says the pain is in left side and goes around to back....  Is Patient Diabetic? No Pain Assessment Patient in pain? no       Does patient need assistance? Functional Status Self care Ambulation Normal   Primary Care Provider:  Tereso Newcomer PA-C  CC:  pt needs reill on synthyroid.... pt says the pain started sat[place]... pt says the pain is in left side and goes around to back.... .  History of Present Illness: Here for left flank pain for about a week. Started while riding in car to the mountains last week. Pain getting worse. Worse with sitting.  Laying down makes it better.   No back injury. Does have some pain in left lateral leg. No radiation to groin. No fever. + chills + mild anorexia No diarrhea.  No hematochezia.  No vomiting.  No melena.   No dysuria.  No hematuria. No h/o colonoscopy.   Problems Prior to Update: 1)  Abdominal Pain  (ICD-789.00) 2)  Folliculitis  (ICD-704.8) 3)  Preventive Health Care  (ICD-V70.0) 4)  Shoulder Pain, Left  (ICD-719.41) 5)  Dermatophytosis of Nail  (ICD-110.1) 6)  Acute Bronchitis  (ICD-466.0) 7)  Costochondritis  (ICD-733.6) 8)  Upper Respiratory Infection  (ICD-465.9) 9)  Nevus  (ICD-216.9) 10)  Lumbago  (ICD-724.2) 11)  Hypertension, Benign Essential  (ICD-401.1) 12)  Headache  (ICD-784.0) 13)  Screening Mammogram For High-risk Patient  (ICD-V76.11) 14)  Leg Pain, Left  (ICD-729.5) 15)  Claudication, Intermittent   (ICD-443.9) 16)  Postmenopausal Status  (ICD-627.2) 17)  Hypothyroidism  (ICD-244.9) 18)  Depression  (ICD-311) 19)  Allergic Rhinitis  (ICD-477.9)  Allergies: No Known Drug Allergies  Past History:  Past Medical History: Last updated: 01/22/2010 Allergic rhinitis Depression Hypothyroidism Postmenopausal symptoms(07/25/06) NEVUS (ICD-216.9)....[facility]. 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  PT initiated in 12/2009   b.  d/c 2.9.2011 - never followed up  Review of Systems      See HPI CV:  Denies chest pain or discomfort, difficulty breathing at night, difficulty breathing while lying down, fainting, and swelling of feet. Resp:  Complains of cough. Derm:  Complains of lesion(s).  Physical Exam  General:  alert, well-developed, and well-nourished.   Head:  normocephalic and atraumatic.   Neck:  supple, no JVD, and no cervical lymphadenopathy.   Lungs:  crackles in bilat bases somewhat diminished with cough no wheezes Heart:  normal rate, regular rhythm, and no gallop.   Abdomen:  soft, no distention, no masses, no guarding, no rigidity, no rebound tenderness, bowel sounds hypoactive, LUQ tenderness, and L flank tenderness.   + referred pain to LUQ with palp R abdomen Msk:  no CVA tenderness bilat  Extremities:  no edema Neurologic:  alert & oriented X3 and cranial nerves II-XII intact.   Skin:  turgor normal.   Psych:  normally interactive and good eye contact.     Impression & Recommendations:  Problem # 1:  ABDOMINAL PAIN (ICD-789.00)  no symptoms c/w nephrolithiasis and u/a neg for blood I am concerned about diverticulitis d/w Dr. Delrae Alfred will get CT of abdomen and labs cannot get CT until Monday. . . start cipro and flagyl today close f/u next week  Orders: T-Comprehensive Metabolic Panel  ([id number]) T-CBC w/Diff ([id number]) T-Sed Rate (Automated) ([id number]) CT with Contrast (CT w/ contrast)  Complete Medication List: 1)  Synthroid 125 Mcg Tabs (Levothyroxine sodium) .Marland Kitchen.. 1 by mouth once daily 2)  Benzonatate 100 Mg Caps (Benzonatate) .Marland Kitchen.. 1 capsule by mouth two times a day as needed for cough 3)  Allegra 180 Mg Tabs (Fexofenadine hcl) .Marland Kitchen.. 1 tablet by mouth daily for allergies 4)  Veramyst 27.5 Mcg/spray Susp (Fluticasone furoate) .Marland Kitchen.. 1 spray in each nostril two times a day 5)  Lisinopril 10 Mg Tabs (Lisinopril) .... Take 1 tab by mouth each am(strted by outside md when in hosp) 6)  Vistaril 25 Mg Caps (Hydroxyzine pamoate) .... Take 1 capsule by mouth every 8 hours for runnynose and allergy 7)  Diclofenac Sodium 75 Mg Tbec (Diclofenac sodium) .... Take 1 tablet by mouth two times a day as needed for pain 8)  Flexeril 10 Mg Tabs (Cyclobenzaprine hcl) .... Take 1 tablet by mouth three times a day as needed for shoulder pain 9)  Nasacort Aq 55 Mcg/act Aers (Triamcinolone acetonide(nasal)) .Marland Kitchen.. 1 spray each nostril once daily 10)  Cipro 500 Mg Tabs (Ciprofloxacin hcl) .... Take 1 tablet by mouth two times a day 11)  Flagyl 500 Mg Tabs (Metronidazole) .... Take 1 tablet by mouth four times a day  Patient Instructions: 1)  Drink clear liquids for the next 24-48 hours.  If you can see the bottom of the glass, it is ok to drink. 2)  Take tylenol or ibuprofen for pain or fever. 3)  Take 650 - 1000 mg of tylenol every 4-6 hours as needed for relief of pain or comfort of fever. Avoid taking more than 4000 mg in a 24 hour period( can cause liver damage in higher doses).  4)  Take 400-600 mg of Ibuprofen (Advil, Motrin) with food every 4-6 hours as needed  for relief of pain or comfort of fever.  5)  If you develop a fever of 101 degrees or higher, feel worse, develop vomiting or diarrhea, have blood in your stools, go to the emergency room. 6)  Follow up with Kyriakos Babler next Monday  or Tuesday. 7)  Take the antibiotics as we directed you. Prescriptions: FLAGYL 500 MG TABS (METRONIDAZOLE) Take 1 tablet by mouth four times a day  #28 x 0   Entered and Authorized by:   Tereso Newcomer PA-C   Signed by:   Tereso Newcomer PA-C on 02/26/2010   Method used:   Print then Give to Patient   RxID:   [id number] CIPRO 500 MG TABS (CIPROFLOXACIN HCL) Take 1 tablet by mouth two times a day  #14 x 0   Entered and Authorized by:   Tereso Newcomer PA-C   Signed by:   Tereso Newcomer PA-C on 02/26/2010   Method used:   Print then Give to Patient   RxID:   [id number]   Laboratory Results   Urine Tests  Date/Time Received: February 26, 2010 12:12  PM   Routine Urinalysis   Glucose: negative   (Normal Range: Negative) Bilirubin: negative   (Normal Range: Negative) Ketone: negative   (Normal Range: Negative) Spec. Gravity: >=1.030   (Normal Range: 1.003-1.035) Blood: negative   (Normal Range: Negative) pH: 6.0   (Normal Range: 5.0-8.0) Protein: trace   (Normal Range: Negative) Urobilinogen: 0.2   (Normal Range: 0-1) Nitrite: negative   (Normal Range: Negative) Leukocyte Esterace: negative   (Normal Range: Negative)

## 2011-01-11 NOTE — Progress Notes (Signed)
Summary: REFILL ON LISIINOPRIL  Medications Added LISINOPRIL 10 MG  TABS (LISINOPRIL) Take 1 tab by mouth each day       Phone Note Call from Patient Call back at Home Phone [phone]   Reason for Call: Refill Medication Summary of Call: [name] Norgard SAYS THAT SHE CALLED IN HER LISINOPRIL LAST WEDNESDAY TO [facility], AND SHE IS NEEDING AREFILL. SHE TOOK HER LAST ONE SATURDAY. Initial call taken by: Leodis Rains,  June 22, 2010 10:27 AM  Follow-up for Phone Call        forward to provider Follow-up by: Arm[place] Shannon,  June 22, 2010 10:55 AM  Additional Follow-up for Phone Call Additional follow up Details #1::        needs follow up for blood pressure Additional Follow-up by: Tereso Newcomer PA-C,  June 22, 2010 9:16 PM    Additional Follow-up for Phone Call Additional follow up Details #2::    Left message on answering machine for pt to call back...Marland KitchenMarland KitchenArmenia Shannon  June 23, 2010 10:43 AM  Left message on answering machine to return call. Dutch Quint RN  June 24, 2010 12:28 PM  Left message on answering machine to return call. Dutch Quint RN  June 25, 2010 11:57 AM   Left message on answering machine for pt to call back....will mail letter.Marland KitchenMarland KitchenArmenia Shannon  June 28, 2010 11:36 AM   New/Updated Medications: LISINOPRIL 10 MG  TABS (LISINOPRIL) Take 1 tab by mouth each day Prescriptions: LISINOPRIL 10 MG  TABS (LISINOPRIL) Take 1 tab by mouth each day  #30 x 2   Entered and Authorized by:   Tereso Newcomer PA-C   Signed by:   Tereso Newcomer PA-C on 06/22/2010   Method used:   Faxed to ...       [facility] (retail)       [address] [place], [place]  [address]       Ph: [phone]       Fax: [phone]   RxID:   [id number]

## 2011-01-11 NOTE — Letter (Signed)
Summary: REFERRAL//PHYSICAL THERAPY//UPDATED  REFERRAL//PHYSICAL THERAPY//UPDATED   Imported By: Arta Bruce 02/17/2010 12:13:30  _____________________________________________________________________  External Attachment:    Type:   Image     Comment:   External Document

## 2011-01-11 NOTE — Miscellaneous (Signed)
Summary: INITIAL SUMMARY  INITIAL SUMMARY   Imported By: Arta Bruce 02/16/2010 15:48:09  _____________________________________________________________________  External Attachment:    Type:   Image     Comment:   External Document

## 2011-01-11 NOTE — Progress Notes (Signed)
Summary: Order to be fax (asap)   Phone Note From Other Clinic   Summary of Call: Sharma Covert, from [facility] just called because they need the CTscan order and the last lab it should include bun and creat fax  [phone] phone number [phone].  Alben Spittle PA-c Initial call taken by: Manon Hilding,  March 01, 2010 8:33 AM  Follow-up for Phone Call        Order faxed to [facility].  Also needs to be for abdominal and pelvis. Follow-up by: Vesta Mixer CMA,  March 01, 2010 9:38 AM

## 2011-03-28 LAB — BASIC METABOLIC PANEL
BUN: 10 mg/dL (ref 6–23)
Chloride: 109 mEq/L (ref 96–112)
GFR calc non Af Amer: >60 mL/min (ref >60)
Glucose, Bld: 95 mg/dL (ref 70–99)
Potassium: 3.6 mEq/L (ref 3.5–5.1)
Sodium: 140 mEq/L (ref 135–145)

## 2011-03-28 LAB — POCT CARDIAC MARKERS
CKMB, poc: <1 ng/mL — ABNORMAL LOW (ref 1.0–8.0)
Myoglobin, poc: 25.1 ng/mL (ref 12–200)
Troponin i, poc: <0.05 ng/mL (ref 0.00–0.09)

## 2011-03-28 LAB — CBC
HCT: 39.7 % (ref 36.0–46.0)
MCV: 101.2 fL — ABNORMAL HIGH (ref 78.0–100.0)
Platelets: 124 10*3/uL — ABNORMAL LOW (ref 150–400)
Platelets: 144 10*3/uL — ABNORMAL LOW (ref 150–400)
RBC: 3.92 MIL/uL (ref 3.87–5.11)
RDW: 13.2 % (ref 11.5–15.5)
RDW: 13.5 % (ref 11.5–15.5)
WBC: 6.3 10*3/uL (ref 4.0–10.5)
WBC: 7.4 10*3/uL (ref 4.0–10.5)

## 2011-03-28 LAB — DIFFERENTIAL
Basophils Absolute: 0 10*3/uL (ref 0.0–0.1)
Basophils Relative: 1 % (ref 0–1)
Monocytes Absolute: 0.6 10*3/uL (ref 0.1–1.0)
Neutro Abs: 3.5 10*3/uL (ref 1.7–7.7)
Neutrophils Relative %: 47 % (ref 43–77)

## 2011-03-28 LAB — URINE DRUGS OF ABUSE SCREEN W ALC, ROUTINE (REF LAB)
Amphetamine Screen, Ur: NEGATIVE
Cocaine Metabolites: NEGATIVE
Creatinine,U: 43 mg/dL
Methadone: NEGATIVE
Opiate Screen, Urine: POSITIVE — AB
Propoxyphene: NEGATIVE

## 2011-03-28 LAB — OPIATE, QUANTITATIVE, URINE
Hydrocodone: NEGATIVE ng/mL
Morphine, Confirm: 460 ng/mL
Oxymorphone: NEGATIVE ng/mL

## 2011-03-28 LAB — CULTURE, BLOOD (ROUTINE X 2): Culture: NO GROWTH

## 2011-03-28 LAB — CARDIAC PANEL(CRET KIN+CKTOT+MB+TROPI)
CK, MB: 0.7 ng/mL (ref 0.3–4.0)
CK, MB: 0.7 ng/mL (ref 0.3–4.0)
CK, MB: 0.8 ng/mL (ref 0.3–4.0)
Relative Index: INVALID (ref 0.0–2.5)
Total CK: 47 U/L (ref 7–177)
Total CK: 54 U/L (ref 7–177)
Troponin I: <0.01 ng/mL (ref 0.00–0.06)

## 2011-03-28 LAB — LIPID PANEL
HDL: 33 mg/dL — ABNORMAL LOW (ref >39)
LDL Cholesterol: 83 mg/dL (ref 0–99)
Triglycerides: 146 mg/dL (ref <150)

## 2011-03-28 LAB — POCT I-STAT, CHEM 8
BUN: 15 mg/dL (ref 6–23)
Calcium, Ion: 1.27 mmol/L (ref 1.12–1.32)
Chloride: 111 mEq/L (ref 96–112)
HCT: 42 % (ref 36.0–46.0)
Potassium: 4 mEq/L (ref 3.5–5.1)
Sodium: 144 mEq/L (ref 135–145)

## 2011-03-28 LAB — PROTIME-INR
INR: 1 (ref 0.00–1.49)
Prothrombin Time: 13.1 seconds (ref 11.6–15.2)

## 2011-03-28 LAB — APTT: aPTT: 29 seconds (ref 24–37)

## 2011-03-28 LAB — THC (MARIJUANA), URINE, CONFIRMATION: Marijuana, Ur-Confirmation: 70 ng/mL

## 2011-04-01 ENCOUNTER — Observation Stay (HOSPITAL_COMMUNITY): Payer: Self-pay

## 2011-04-01 ENCOUNTER — Observation Stay (HOSPITAL_COMMUNITY)
Admission: EM | Admit: 2011-04-01 | Discharge: 2011-04-02 | Disposition: A | Discharge status: Home / Self Care | Payer: Self-pay | Attending: Internal Medicine | Admitting: Internal Medicine

## 2011-04-01 ENCOUNTER — Emergency Department (HOSPITAL_COMMUNITY): Payer: Self-pay

## 2011-04-01 ENCOUNTER — Encounter (HOSPITAL_COMMUNITY): Payer: Self-pay | Admitting: Radiology

## 2011-04-01 DIAGNOSIS — M25519 Pain in unspecified shoulder: Secondary | ICD-10-CM | POA: Insufficient documentation

## 2011-04-01 DIAGNOSIS — I10 Essential (primary) hypertension: Secondary | ICD-10-CM | POA: Insufficient documentation

## 2011-04-01 DIAGNOSIS — M719 Bursopathy, unspecified: Secondary | ICD-10-CM | POA: Insufficient documentation

## 2011-04-01 DIAGNOSIS — E039 Hypothyroidism, unspecified: Secondary | ICD-10-CM | POA: Insufficient documentation

## 2011-04-01 DIAGNOSIS — R079 Chest pain, unspecified: Principal | ICD-10-CM | POA: Insufficient documentation

## 2011-04-01 DIAGNOSIS — R072 Precordial pain: Secondary | ICD-10-CM

## 2011-04-01 DIAGNOSIS — F17200 Nicotine dependence, unspecified, uncomplicated: Secondary | ICD-10-CM | POA: Insufficient documentation

## 2011-04-01 DIAGNOSIS — M67919 Unspecified disorder of synovium and tendon, unspecified shoulder: Secondary | ICD-10-CM | POA: Insufficient documentation

## 2011-04-01 HISTORY — DX: Essential (primary) hypertension: I10

## 2011-04-01 LAB — CBC
HCT: 39.5 % (ref 36.0–46.0)
Hemoglobin: 13.3 g/dL (ref 12.0–15.0)
MCH: 34.7 pg — ABNORMAL HIGH (ref 26.0–34.0)
MCV: 103.1 fL — ABNORMAL HIGH (ref 78.0–100.0)
RBC: 3.83 MIL/uL — ABNORMAL LOW (ref 3.87–5.11)

## 2011-04-01 LAB — DIFFERENTIAL
Basophils Relative: 0 % (ref 0–1)
Lymphocytes Relative: 41 % (ref 12–46)
Lymphs Abs: 3.9 10*3/uL (ref 0.7–4.0)
Monocytes Relative: 9 % (ref 3–12)
Neutro Abs: 4.6 10*3/uL (ref 1.7–7.7)
Neutrophils Relative %: 48 % (ref 43–77)

## 2011-04-01 LAB — CARDIAC PANEL(CRET KIN+CKTOT+MB+TROPI)
CK, MB: 0.9 ng/mL (ref 0.3–4.0)
Relative Index: INVALID (ref 0.0–2.5)
Total CK: 96 U/L (ref 7–177)

## 2011-04-01 LAB — POCT I-STAT, CHEM 8
BUN: 17 mg/dL (ref 6–23)
Chloride: 111 mEq/L (ref 96–112)
Creatinine, Ser: 1 mg/dL (ref 0.4–1.2)
Potassium: 3.7 mEq/L (ref 3.5–5.1)
Sodium: 146 mEq/L — ABNORMAL HIGH (ref 135–145)

## 2011-04-01 LAB — BASIC METABOLIC PANEL
BUN: 9 mg/dL (ref 6–23)
Calcium: 9.5 mg/dL (ref 8.4–10.5)
GFR calc non Af Amer: >60 mL/min (ref >60)
Glucose, Bld: 97 mg/dL (ref 70–99)

## 2011-04-01 LAB — URINALYSIS, ROUTINE W REFLEX MICROSCOPIC
Ketones, ur: NEGATIVE mg/dL
Nitrite: NEGATIVE
pH: 5.5 (ref 5.0–8.0)

## 2011-04-01 LAB — POCT CARDIAC MARKERS: Troponin i, poc: <0.05 ng/mL (ref 0.00–0.09)

## 2011-04-01 LAB — CK TOTAL AND CKMB (NOT AT ARMC): CK, MB: 0.8 ng/mL (ref 0.3–4.0)

## 2011-04-01 MED ORDER — IOHEXOL 300 MG/ML  SOLN: 70.0000 mL | Freq: Once | INTRAMUSCULAR | Status: AC | PRN

## 2011-04-20 NOTE — Discharge Summary (Signed)
[name], PIXLER            ACCOUNT NO.:  [id number]  MEDICAL RECORD NO.:  [id number]           PATIENT TYPE:  O  LOCATION:  2002                         FACILITY:  [facility]  PHYSICIAN:  Calvert Cantor, M.D.     DATE OF BIRTH:  April 02, 1951  DATE OF ADMISSION:  04/01/2011 DATE OF DISCHARGE:                              DISCHARGE SUMMARY   PRIMARY CARE PHYSICIAN:  At [facility].  PRESENTING COMPLAINT:  Chest and left shoulder pain.  DISCHARGE DIAGNOSES: 1. Chest pain likely radiating from left shoulder. 2. Left shoulder pain secondary to tendonitis and mild bursitis. 3. 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  PAST MEDICAL HISTORY: 1. Hypertension. 2. Hypothyroidism.  MEDICATIONS: 1. New medication Vicodin 5/325 1-2 tablets q.4 h. as needed for     moderate to severe pain. 2. She is also recommend to take ibuprofen 600 mg every 8 hours.  Continue the following home medications; 1. Allegra 180 mg daily. 2. Levothyroxine 125 mg daily. 3. Lisinopril 10 mg daily.  Also she does not choose to take ibuprofen.  She can take Aleve as directed one bottle.  PERTINENT STUDIES: 1. Chest x-ray portable, mild cardiac enlargement and evidence of     pulmonary disease. 2. CT angiogram of the chest.  No evidence of PE, 5 mm indeterminate     nodule in the right upper lobe, 65-month follow-up recommended. 3. X-ray of the left shoulder did not reveal any acute or significant     finding. 4. MR C-spine, mild left sternoclavicular articulation is asymmetric     and appears to be normal, query history of remote trauma, which may     have resulted in a posterior dislocation of left [place] joint. 5. Multifactorial mild spinal stenosis with minimal mass effect on the     spinal cord and no spinal cord signal abnormality at C4-C5, C5-C6     and C6-C7. 6. Multifactorial moderate to severe neural foraminal stenosis at     bilateral C5, bilateral C7,  bilateral T3, right T4 and right T5     current levels. 7. MRI of the shoulder revealed supraspinatus and infraspinatus     tendinosis with associated mild subacromial and subdeltoid     bursitis, mild-to-moderate acromioclavicular degenerative changes.  HOSPITAL COURSE:  This is a 60 year old female who came to the ER with complaint of 4 days of left-sided chest pain and 10 days of left-sided shoulder pain.  Chest pain was described as pressure-like and worse when she takes a deep breath.  Therefore, a CT scan of the chest was done and PE was ruled out.  Three sets of cardiac enzymes were done and acute MI was ruled out.  A 2-D echo was also performed, which revealed an EF of 60%-65%, wall thickness is increased in the pattern of mild LVH and there is mild mitral regurgitation.  MRI of the C-spine and shoulder was also obtained with results as mentioned above.  The patient admits to continue trouble with her left shoulder.  She is unable to lift her arm up above her head and points to an area  of tenderness behind her shoulder near the medial upper border of her scapula.  I have spoken with on-call orthopedic surgeon, Dr. Jodi Geralds who is currently recommending pain medications, NSAIDS and follow up in his office in 2 days.  In addition, he recommends that her arm be placed in a sling.  The patient has been explained this plan and she is in agreement with it.  PHYSICAL EXAMINATION:  EXTREMITIES:  Left shoulder exam revealed point tenderness as mentioned above in the upper medial corner of the scapula. She is unable to lift her arm above her head.  There is no other focal tenderness. HEART:  Regular rate and rhythm.  No murmurs. LUNGS:  Clear bilaterally.  Normal respiratory effort.  No use of accessory muscles.  She does have a congested cough, which she states is chronic.  CONDITION ON DISCHARGE:  Stable.  FOLLOWUP: 1. With [employer] in 2 days. 2. With  [facility] in 1-2 weeks.  Time on discharge 45 minutes.     Calvert Cantor, M.D.     SR/MEDQ  D:  04/02/2011  T:  04/02/2011  Job:  [id number]  cc:   [facility]  Electronically Signed by Calvert Cantor M.D. on 04/20/2011 11:02:57 PM

## 2011-04-26 NOTE — Consult Note (Signed)
[name], [name] NO.:  [id number]   MEDICAL RECORD NO.:  [id number]          PATIENT TYPE:  INP   LOCATION:  3704                         FACILITY:  [facility]   PHYSICIAN:  [name] Riffle, MD, FACCDATE OF BIRTH:  [birth year]   DATE OF CONSULTATION:  12/25/2008  DATE OF DISCHARGE:                                 CONSULTATION   REASON FOR CONSULTATION:  Bradycardia and chest pain.   PRIMARY CARDIOLOGIST:  [name] Riffle, MD, [facility]   PRIMARY CARE PHYSICIAN:  [name], M.D. with [facility].   HISTORY OF PRESENT ILLNESS:  A 60 year old African American female with  complaints of chest pain and back pain x3 weeks, constant, worsens with  walking, moving, and taking deep breaths.  Pain is worsening, described  as a throbbing pain, starts in the chest and goes around to the back,  having trouble breathing for some time.  She saw primary care physician  2 weeks ago and given muscle relaxers and Neurontin secondary to  musculoskeletal pain, went back 1 week later and was given Tylenol No. 3  and antibiotics.  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  The patient was  found to be mildly bradycardic in the 50s and we were asked to evaluate  secondary to this by [facility].   REVIEW OF SYSTEMS:  Fevers, chills, headaches, cough, shortness of  breath, dyspnea on exertion, and chest pain.   PAST MEDICAL HISTORY:  1. Depression.  2[facility] for [facility] ideation.  3. Hypertension.  4. Hypothyroidism.   PAST SURGICAL HISTORY:  Eye surgery.   SOCIAL HISTORY:  She lives in [place] with her boyfriend.  She is a  Holiday representative.  She is unmarried.  She has a 35-pack-  year history.  She drinks alcohol on the weekends and occasionally  smokes marijuana.   FAMILY HISTORY:  Mother deceased with cancer.  She  has a brother with  lung cancer who is deceased and another brother with lung cancer.   CURRENT MEDICATIONS:  The patient does not know doses, but she is on  atenolol, Synthroid, Allegra, and Neurontin.   ALLERGIES:  No known drug allergies.   CURRENT LABORATORY STUDIES:  Sodium 144, potassium 4.0, chloride 101,  CO2 26, BUN 15, creatinine 0.9, and glucose 84.  Hemoglobin 13.5,  hematocrit 39.7, white blood cells 7.4, and platelets 144.  Troponin  less than 0.05.  PTT 29, PT 13.1, and INR 1.0.   Chest x-ray revealing borderline cardiomegaly without acute disease.  EKG revealing sinus bradycardia with first-degree AV block, ventricular  rate of 59 beats per minute.   PHYSICAL EXAMINATION:  VITAL SIGNS:  Blood pressure 142/74, pulse 53,  respirations 15, temperature 97.1, and O2 sat 100% 2 L.  HEENT:  Head is normocephalic and atraumatic.  Eyes PERRLA.  Mucous  membranes mouth pink and moist.  Tongue is midline.  NECK:  Supple without JVD or carotid bruits  appreciated.  CARDIOVASCULAR:  Regular rate and rhythm, bradycardic without murmurs,  rubs, or gallops.  LUNGS:  Clear to auscultation.  ABDOMEN:  Obese, nontender, 2+ bowel sounds.  EXTREMITIES:  Without clubbing, cyanosis, or edema.  NEUROLOGIC:  Cranial nerves II-XII are grossly intact.   IMPRESSION:  1. Atypical chest pain.  2. Bradycardia.  3. Hypothyroidism.  4. History of hypertension.  5. History of depression.   PLAN:  This is a 60 year old African American female with known history  of hypertension and hypothyroidism, who presented to the emergency room  with complaints of chest pain ongoing for 3 weeks radiating around to  her back with cold-like symptoms and cough, who has been treated as an  outpatient by primary care.  The pain continued and worsened and  presented to the emergency room.  We were asked to see the patient  secondary to bradycardia.   The patient has been seen and examined by myself and Dr.  Dietrich Cooley in  the emergency room.  The chest pain appears to be musculoskeletal versus  pleuritic.  We will discontinue atenolol secondary to bradycardia.  Check TSH which we have already done.  We will add lisinopril for blood  pressure control instead.  We will check echocardiogram for LV function  secondary to history of hypertension and follow making further  recommendations throughout hospital course.      [name] Mare. Lyman Bishop, NP      [name] Riffle, MD, [facility]  Electronically Signed    KML/MEDQ  D:  12/25/2008  T:  12/26/2008  Job:  [id number]   cc:   [name], M.D.

## 2011-04-26 NOTE — H&P (Signed)
NAMEGLENDELL, [name] NO.:  [id number]   MEDICAL RECORD NO.:  [id number]          PATIENT TYPE:  EMS   LOCATION:  MAJO                         FACILITY:  [facility]   PHYSICIAN:  Eduard Clos, MDDATE OF BIRTH:  28-Sep-1951   DATE OF ADMISSION:  12/25/2008  DATE OF DISCHARGE:                              HISTORY & PHYSICAL   PRIMARY CARE PHYSICIAN:  Dr. Reche Dixon, at [facility].   CHIEF COMPLAINT:  Chest pain.   HISTORY OF PRESENT ILLNESS:  A 60 year old female with known history of  hypertension, hypothyroidism, polysubstance abuse presented to the ER  complaining of chest pain.  The patient having chest pain over the last  3 weeks with productive cough.  Denies any fever or chills.  Denies any  palpitation, dizziness or loss of consciousness.  In the ER, the patient  was found to have bradycardia.  Patient was taking atenolol, dose not  known at this time.  Cardiology has already seen the patient and has  advised to discontinue atenolol and 2-d echo has been ordered.  Lisinopril has been started for her blood pressure control.  The patient  presently is chest pain-free.  Patient states the chest pain is usually  retrosternal, moves to her back and left hand, increases with exertion,  is not able to characterize the pain.  Denies any loss of consciousness,  weakness or complaints of nausea, vomiting, abdominal pain, fever,  chills and dysuria __________or diarrhea.   PAST MEDICAL HISTORY:  1. Hypertension.  2. Hypothyroidism.  3. Polysubstance abuse.  4. History of depression.   PAST SURGICAL HISTORY:  None.   MEDICATIONS PRIOR TO ADMISSION:  Esmolol, dose unknown; Synthroid, dose  unknown; Neurontin, dose unknown, and Allegra, dose unknown.  Will  contact [facility] pharmacy in the morning, as it is closed now, to get  the doses.   ALLERGIES:  No known drug allergies.   FAMILY HISTORY:  Noncontributory.   SOCIAL HISTORY:  Patient smokes cigarettes,  has been advised to quit  smoking.  Drinks alcohol occasionally and uses marijuana.  Patient has  been advised to quit any illegal drugs.   REVIEW OF SYSTEMS:  As per history of present illness, nothing of  significance.   PHYSICAL EXAMINATION:  Patient examined at bedside, not in acute  distress.  VITAL SIGNS:  Blood pressure is 167/85, pulse now is 60 per minute,  temperature 97.8, respirations 18 per minute, O2 sat 97%.  HEENT:  Anicteric.  No pallor.  CHEST:  Bilateral air entry present.  No rhonchi, no crepitation.  HEART:  S1, S2 heard.  ABDOMEN:  Soft, nontender.  Bowel sounds are seen.  NEUROLOGIC:  Alert, awake, oriented to time, place and person.  Moves  upper and lower extremities 5/5.  EXTREMITIES:  Pedal pulses are felt.  No edema.   LABS:  Electrocardiogram shows sinus bradycardia at rate of 44 per  minute.  CBC:  WBC 7.4, hemoglobin 14.3, hematocrit 42, MCV 101.2,  platelets 144.  PT/INR 13.1 and 1.  Basic metabolic panel:  Sodium 144,  potassium 4, chloride 111, glucose 84, BUN 15, creatinine 0.9, troponin  I less than 0.05.   ASSESSMENT:  1. Atypical chest pain.  2. Bradycardia probably from atenolol.  3. Acute bronchitis.  4. Hypertension.  5. History of polysubstance abuse.  6. Allergic rhinitis.   PLAN:  Will admit patient to telemetry.  I appreciate cardiology  consult.  Will order atenolol, start the patient on lisinopril, will get  a drug screen and will try to contact [facility] pharmacy in the  morning regarding her usual doses of Synthroid and Neurontin and restart  it here.  Will also get an x-ray of right shoulder and cervical spine,  as she is complaining of some pain, and place on tramadol for pain  relief.      Eduard Clos, MD  Electronically Signed     ANK/MEDQ  D:  12/25/2008  T:  12/25/2008  Job:  [id number]

## 2011-04-29 NOTE — Discharge Summary (Signed)
[name], [name] NO.:  [id number]   MEDICAL RECORD NO.:  [id number]          PATIENT TYPE:  INP   LOCATION:  3704                         FACILITY:  [facility]   PHYSICIAN:  Altha Harm, MDDATE OF BIRTH:  1951/12/09   DATE OF ADMISSION:  12/25/2008  DATE OF DISCHARGE:  12/26/2008                               DISCHARGE SUMMARY   DISCHARGE DISPOSITION:  Home.   DISCHARGE DIAGNOSES:  1. Chest pain noncardiac.  2. Hypertension.  3. Hypothyroidism.  4. History of polysubstance abuse.  5. History of depression.  6. Acute bronchitis.  7. Bradycardia secondary to atenolol.   DISCHARGE MEDICATIONS:  1. Lisinopril 10 mg p.o. daily.  2. Azithromycin 250 mg p.o. daily x4 days.  3. Allegra 180 mg p.o. daily.  4. Neurontin 600 mg p.o. q.h.s.  5. Synthroid 125 mcg p.o. daily.  6. Celebrex 200 mg p.o. daily.  7. Fioricet one tab p.o. q.6 hours p.r.n.  8. Flexeril 10 mg p.o. q.8 hours p.r.n.   DISCONTINUED MEDICATIONS:  Atenolol.   CONSULTANT:  Pricilla Riffle, MD, [facility] of cardiology.   PROCEDURES:  None.   DIAGNOSTIC STUDIES:  1. Chest x-ray two-view done on admission which shows borderline      cardiomegaly without acute disease.  2. Shoulder x-ray done which shows no acute bony abnormality.  3. X-ray of the cervical spine two-view which shows mild spondylosis.      No acute findings.  4. A 2-D echocardiogram which shows an ejection fraction of 60% with      normal LV function.  There is mild increase in left ventricular      wall thickness and mild dilation of the left atrium.   CODE STATUS:  Full code.   ALLERGIES:  No known drug allergies.   PRIMARY CARE PHYSICIAN:  Dr. Reche Dixon, [facility].   CHIEF COMPLAINT:  Chest pain.   HISTORY OF PRESENT ILLNESS:  Please refer to the H and P dictated by Dr.  Midge Minium for details of the HPI.   HOSPITAL COURSE:  1. Chest pain.  The patient was admitted with complaints of atypical      chest pain.   It was felt to be musculoskeletal.  Cardiology was      consulted to see the patient on admission.  Dr. Tenny Craw felt that this      was completely noncardiac and that no further evaluation was      warranted particularly in light of the normal echocardiogram.  2. Bradycardia.  The patient had been on atenolol on a low dose and it      was felt that this was the etiology of the bradycardia.  The      atenolol was discontinued and the heart rate recovered.  The      patient's atenolol was discontinued at time of discharge.  3. Hypertension.  The patient's blood pressures were normal throughout      her hospital stay and she was continued on her usual dose of      Synthroid.   The patient was otherwise stable during her hospitalization and she is  being discharged on medications for her chronic illnesses.  The patient  to follow up with [facility] within 1 week particularly for evaluation  of her heart rate with the discontinuation of the atenolol.   DIETARY RESTRICTIONS:  She should be on a heart-healthy diet.   PHYSICAL RESTRICTIONS:  None.   Total time of discharge was 28 minutes.      Altha Harm, MD  Electronically Signed     Altha Harm, MD  Electronically Signed    MAM/MEDQ  D:  01/29/2009  T:  01/29/2009  Job:  [id number]

## 2011-04-29 NOTE — H&P (Signed)
NAME:  [name], [name] NO.:  [id number]   MEDICAL RECORD NO.:  [id number]                   PATIENT TYPE:  IPS   LOCATION:  0303                                 [facility]   PHYSICIAN:  Jeanice Lim, M.D.              DATE OF BIRTH:  July 20, 1951   DATE OF ADMISSION:  07/22/2003  DATE OF DISCHARGE:                         PSYCHIATRIC ADMISSION ASSESSMENT   IDENTIFYING INFORMATION:  A 60 year old separated African-American female,  voluntarily admitted on July 22, 2003.   HISTORY OF PRESENT ILLNESS:  The patient presents with a history of  depression and suicidal thoughts for the past 2-3 days.  She reports her  depression has been affecting her sleep and appetite.  She reports that she  has no specific plans on harming herself, having only passive suicidal  thoughts.  The patient reports being just overwhelmed with grief, having 3  family deaths in the past year.  She denies any psychotic symptoms.  She  denies any other specific stressors.   PAST PSYCHIATRIC HISTORY:  First hospitalization to [facility], no other psychiatric admissions, no history of a suicide attempt, no  outpatient treatment.   SOCIAL HISTORY:  She is a 60 year old separated African-American female,  married for 7 years, separated for 3 years.  I was her first marriage.  She  has a 81 year old daughter.  She lives alone.  She has no legal problems.  She has completed the 12th grade.  The patient works at [employer].   FAMILY HISTORY:  Unclear.   ALCOHOL DRUG HISTORY:  The patient smokes, no alcohol or drug use.   PAST MEDICAL HISTORY:  Primary care Lenis Nettleton is none.  Medical problems are  hypothyroidism.   MEDICATIONS:  Synthroid 0.125 mg, amitriptyline q.h.s.   DRUG ALLERGIES:  No known allergies.   PHYSICAL EXAMINATION:  Done at [facility].  The patient is an overweight  middle-aged female in no acute distress.  Her most recent vital signs:  97.4  temperature, 53 heart rate, 16 respirations, her blood pressure was 139/94.  She is 5 feet 8 inches tall, she is 183 pounds.   LABORATORY DATA:  CBC:  WBC count is elevated mildly at 11.3.  Urine drug  screen is positive for THC.  Alcohol level was less than 5.  CMET is within  normal limits.   MENTAL STATUS EXAM:  Alert, middle-aged female, cooperative, little eye  contact, psychomotor retardation.  Speech is very soft spoken.  The patient  is flat, with some mild irritability noted.  Thought processes are coherent.  Does not appear to be responding to any internal stimuli.  Cognitive  function intact.  Memory is fair, judgment and insight are fair.  She is a  poor historian.   ADMISSION DIAGNOSES:   AXIS I:  Major depression, recurrent.   AXIS II:  Deferred.   AXIS III:  Hypothyroidism.   AXIS IV:  Problems with primary  support group related to significant grief  issues and other psychosocial problems.   AXIS V:  Current 30, past year 89.   PLAN:  Voluntary admission for depression and suicidal ideation.  Contract  for safety, check every 15 minutes.  Will stabilize mood and thinking so the  patient can be safe and functional.  We will initiate Wellbutrin to decrease  depressive symptoms.  Will consider a session with her sister.  The patient  is to attend groups to increase coping skills.  The patient to be medication  compliant, which was discussed with the patient.  The patient is to follow  up with mental health and to consider individual therapy.   TENTATIVE LENGTH OF CARE:  3-5 days.       Landry Corporal, N.P.                       Jeanice Lim, M.D.    JO/MEDQ  D:  07/25/2003  T:  07/25/2003  Job:  [id number]

## 2011-04-29 NOTE — Discharge Summary (Signed)
NAME:  [name], [name] NO.:  [id number]   MEDICAL RECORD NO.:  [id number]                   PATIENT TYPE:  IPS   LOCATION:  0303                                 [facility]ILITY[facility]   PHYSICIAN:  Jeanice Lim, M.D.              DATE OF BIRTH:  [birth year]   DATE OF ADMISSION:  07/22/2003  DATE OF DISCHARGE:  07/26/2003                                 DISCHARGE SUMMARY   IDENTIFYING DATA:  This is a 60 year old separated, voluntarily admitted  female reporting suicidal ideation.  Three family deaths in the last year,  status post multiple losses.   MEDICATIONS:  1. Synthroid.  2. An antihypertensive medicine.   DRUG ALLERGIES:  No known drug allergies.   PHYSICAL EXAMINATION:  GENERAL:  Essentially within normal limits.  NEUROLOGIC:  Nonfocal.   LABORATORY DATA:  White count slightly elevated at 11.3.  Urine drug screen  was positive for THC.  Alcohol level was less than 5.  CMET: Within normal  limits.   MENTAL STATUS EXAM:  Alert, middle-aged female, cooperative, little eye  contact, psychomotor retardation.  Soft spoken.  Mood: Depressed.  Affect:  Flat.  Thought process: Goal directed.  Thought content: Positive for  passive suicidal ideation; otherwise intact.  Judgment and insight: Fair.   ADMISSION DIAGNOSES:   AXIS I:  Major depressive disorder, recurrent, moderate.   AXIS II:  Deferred.   AXIS III:  Hypothyroidism.   AXIS IV:  Moderate problems with primary support group and other  psychosocial issues.   AXIS V:  30/60   HOSPITAL COURSE:  The patient was admitted, ordered routine p.r.n.  medications, underwent further monitoring, and was encouraged to participate  in individual, group, and milieu therapy.  The patient initially reported  suicidal ideation, decreased appetite, and other neurovegetative symptoms of  depression.  The patient was started on Zoloft due to lack of response to  Paxil and Seroquel to stabilize mood.   The patient reported resolution of  suicidal thoughts and affect improved.  She had no dangerous ideation,  reporting a positive response to medications and clinical intervention at  the time of discharge.   DISCHARGE MEDICATIONS:  1. Synthroid 125 mcg daily.  2. Wellbutrin XL 150 mg daily.  3. Restoril 15 mg q.h.s.  4. Zoloft 50 mg one half daily.  5. Seroquel 100 mg q.h.s.   FOLLOW UP:  The patient was to follow up at [facility] on  August 18 at 1:00.   DISCHARGE DIAGNOSES:   AXIS I:  Major depressive disorder, recurrent, moderate.   AXIS II:  Deferred.   AXIS III:  Hypothyroidism.   AXIS IV:  Moderate problems with primary support group and other  psychosocial issues.   AXIS V:  Global assessment of functioning on discharge was 55.  Jeanice Lim, M.D.    JEM/MEDQ  D:  09/01/2003  T:  09/02/2003  Job:  [id number]

## 2011-05-04 NOTE — H&P (Signed)
NAMEZYON, GROUT            ACCOUNT NO.:  [id number]  MEDICAL RECORD NO.:  [id number]           PATIENT TYPE:  E  LOCATION:  [facility]                         FACILITY:  [facility]  PHYSICIAN:  Roselle Norton I Yousra Ivens, MD      DATE OF BIRTH:  31-Aug-1951  DATE OF ADMISSION:  04/01/2011 DATE OF DISCHARGE:                             HISTORY & PHYSICAL   CHIEF COMPLAINT:  Chest pain for 4 days and shoulder pain for 10 days.  HISTORY OF PRESENT ILLNESS:  This is a 60 year old African American female with a history of hypertension, hypothyroidism, who presented to the emergency room with a history of left shoulder pain which was started almost 10 days ago.  Patient denies any trauma to the shoulder, complains of pain on the left shoulder almost 8/10 aggravated by any movement, also with rest.  The patient did not seek medical attention, but today she noticed that she also has left side chest pain mainly when she is taking a deep breath.  Pain is pressure like, associated with described shoulder pain and she felt like numbness on her left shoulder. The patient denies shortness of breath.  Denies any nausea or vomiting or sweating associated with the pain.  Pain mainly on the left side of the chest.  Workup in the emergency room did show normal sinus rhythm EKG, one set of marker was negative.  PAST MEDICAL HISTORY: 1. Hypertension. 2. Hypothyroidism.  ALLERGIES:  No known drug allergies.  PAST SURGICAL HISTORY:  None.  FAMILY HISTORY:  Mother and father both died with history of cancer. The patient cannot recall what kind of cancer.  SOCIAL HISTORY:  The patient smokes cigarettes, smokes almost one pack to half pack since she was 64.  She drinks alcohol occasionally.  She also uses marijuana.  The patient cannot recall what the time of last drinking or using marijuana.  REVIEW OF SYSTEMS:  As per history of present illness.  PHYSICAL EXAMINATION:  VITAL SIGNS:  Temperature 98.5, blood  pressure 182/95, pulse rate 79 but dropped to 50, respiratory rate 20, saturation 100% on 2 L. HEENT:  Anicteric.  No pallor.  Pupils are equal, reactive to light and accommodation.  No papilledema.  Mucosa moist.  No masses. NECK:  No thyromegaly. HEART:  S1 and S2 with no added sounds. LUNGS:  There are some rales on the right lung.  No wheezing. ABDOMEN:  Soft, nontender.  Bowel sounds positive. EXTREMITIES:  No lower limb edema.  Peripheral pulses intact. CNS:  The patient is awake, alert, oriented x3 with no focal neurological sign.  CBC:  White blood cells 7.6, hemoglobin 13.3, hematocrit 39.5, and platelets 180.  Sodium 146, potassium 3.7, chloride 111, glucose 95, BUN 17, creatinine 1.  Chest x-ray, mild cardiac enlargement, no evidence of active pulmonary disease.  ASSESSMENT AND PLAN: 1. Chest pain, rule ACS.  The patient was really     hypertensive with blood pressure around 182/92.  We need to rule     out  dissection.  We will go ahead with CT angio of the chest.  The     patient seems stable.  We will hold beta-blocker secondary to     bradycardia where heart dropped to less than 52.  We will provide     the patient with lisinopril 20 mg p.o. daily and verapamil 40 mg     p.o. t.i.d.  Also hydralazine 10 mg IV q.4 h. p.r.n. for systolic     above 160.  We will get 2-D echo.  The patient will be admitted to     telemetry. 2. Uncontrolled hypertension, as above. 3. Left shoulder pain.  There is no effusion on examination, but there     is limited movement of the shoulder joint.  We will get ESR,  total     CK, and x-ray of the shoulder to view.  The patient is moving all     her extremities.  There is no evidence of numbness or weakness.     Further recommendation as hospital course progresses.     Adalynn Corne Bosie Helper, MD     HIE/MEDQ  D:  04/01/2011  T:  04/01/2011  Job:  [id number]  Electronically Signed by Ebony Cargo MD on 05/04/2011 03:50:29 PM

## 2011-09-06 LAB — DIFFERENTIAL
Basophils Absolute: 0
Eosinophils Relative: 1
Lymphocytes Relative: 41
Monocytes Absolute: 1
Monocytes Relative: 10

## 2011-09-06 LAB — POCT CARDIAC MARKERS
CKMB, poc: <1 — ABNORMAL LOW
Myoglobin, poc: 26.3
Operator id: 257131
Operator id: 257131
Troponin i, poc: <0.05
Troponin i, poc: <0.05

## 2011-09-06 LAB — CBC
HCT: 36.9
Hemoglobin: 12.6
MCHC: 34.2
RBC: 3.63 — ABNORMAL LOW
RDW: 14

## 2011-09-06 LAB — POCT I-STAT, CHEM 8
BUN: 17
Calcium, Ion: 1.24
Glucose, Bld: 107 — ABNORMAL HIGH
TCO2: 24

## 2012-03-21 ENCOUNTER — Other Ambulatory Visit (HOSPITAL_COMMUNITY): Payer: Self-pay | Admitting: Internal Medicine

## 2012-03-21 DIAGNOSIS — Z1231 Encounter for screening mammogram for malignant neoplasm of breast: Secondary | ICD-10-CM

## 2012-03-27 ENCOUNTER — Ambulatory Visit (HOSPITAL_COMMUNITY): Payer: Self-pay

## 2012-04-20 ENCOUNTER — Ambulatory Visit (HOSPITAL_COMMUNITY)
Admission: RE | Admit: 2012-04-20 | Discharge: 2012-04-20 | Disposition: A | Discharge status: Home / Self Care | Payer: Self-pay | Source: Ambulatory Visit | Attending: Internal Medicine | Admitting: Internal Medicine

## 2012-04-20 DIAGNOSIS — Z1231 Encounter for screening mammogram for malignant neoplasm of breast: Secondary | ICD-10-CM | POA: Insufficient documentation

## 2012-05-02 ENCOUNTER — Other Ambulatory Visit (HOSPITAL_COMMUNITY): Payer: Self-pay | Admitting: Internal Medicine

## 2012-05-02 ENCOUNTER — Ambulatory Visit (HOSPITAL_COMMUNITY)
Admission: RE | Admit: 2012-05-02 | Discharge: 2012-05-02 | Disposition: A | Discharge status: Home / Self Care | Payer: Self-pay | Source: Ambulatory Visit | Attending: Internal Medicine | Admitting: Internal Medicine

## 2012-05-02 DIAGNOSIS — R52 Pain, unspecified: Secondary | ICD-10-CM

## 2012-05-02 DIAGNOSIS — M542 Cervicalgia: Secondary | ICD-10-CM | POA: Insufficient documentation

## 2012-07-20 ENCOUNTER — Inpatient Hospital Stay (HOSPITAL_COMMUNITY)
Admission: AD | Admit: 2012-07-20 | Discharge: 2012-07-20 | Disposition: A | Discharge status: Home / Self Care | Payer: Self-pay | Source: Ambulatory Visit | Attending: Obstetrics & Gynecology | Admitting: Obstetrics & Gynecology

## 2012-07-20 ENCOUNTER — Encounter (HOSPITAL_COMMUNITY): Payer: Self-pay | Admitting: *Deleted

## 2012-07-20 DIAGNOSIS — M549 Dorsalgia, unspecified: Secondary | ICD-10-CM | POA: Insufficient documentation

## 2012-07-20 DIAGNOSIS — M25519 Pain in unspecified shoulder: Secondary | ICD-10-CM | POA: Insufficient documentation

## 2012-07-20 DIAGNOSIS — M5432 Sciatica, left side: Secondary | ICD-10-CM

## 2012-07-20 DIAGNOSIS — M5430 Sciatica, unspecified side: Secondary | ICD-10-CM | POA: Insufficient documentation

## 2012-07-20 DIAGNOSIS — I10 Essential (primary) hypertension: Secondary | ICD-10-CM | POA: Insufficient documentation

## 2012-07-20 LAB — URINALYSIS, ROUTINE W REFLEX MICROSCOPIC
Bilirubin Urine: NEGATIVE
Ketones, ur: NEGATIVE mg/dL
Leukocytes, UA: NEGATIVE
Nitrite: NEGATIVE
Protein, ur: NEGATIVE mg/dL
Urobilinogen, UA: 0.2 mg/dL (ref 0.0–1.0)

## 2012-07-20 MED ORDER — CYCLOBENZAPRINE HCL 10 MG PO TABS: 10.0000 mg | ORAL_TABLET | Freq: Three times a day (TID) | ORAL | Status: DC | PRN

## 2012-07-20 MED ORDER — TRAMADOL HCL 50 MG PO TABS: 50.0000 mg | ORAL_TABLET | Freq: Once | ORAL | Status: DC

## 2012-07-20 MED ORDER — TRAMADOL HCL 50 MG PO TABS
50.0000 mg | ORAL_TABLET | Freq: Once | ORAL | Status: AC
  Administered 2012-07-20: 50 mg via ORAL
  Filled 2012-07-20: qty 1

## 2012-07-20 NOTE — MAU Provider Note (Signed)
History     CSN: [id number]  Arrival date and time: 07/20/12 7253   First Provider Initiated Contact with Patient 07/20/12 2039      Chief Complaint  Patient presents with  . Back Pain  . Leg Pain   HPI This is a 61 y.o. female who presents with c/o left sciatica.  She went to [facility] ED and did not want to wait there. [facility] been seeing [facility] for chronic shoulder and back pain. Has been using both Motrin and Naproxen for pain with no relief. Takes 800mg  q 4 hrs of Motrin. Has not tried any other treatments other than heat.    OB History    Grav Para Term Preterm Abortions TAB SAB Ect Mult Living   2 1 1  1  1   1       Past Medical History  Diagnosis Date  . Hypertension     Past Surgical History  Procedure Date  . Eye surgery   . Cesarean section     No family history on file.  History  Substance Use Topics  . Smoking status: Current Everyday Smoker -- 0.2 packs/day  . Smokeless tobacco: Not on file  . Alcohol Use: Yes     occational    Allergies: No Known Allergies  Prescriptions prior to admission  Medication Sig Dispense Refill  . ibuprofen (ADVIL,MOTRIN) 800 MG tablet Take 800 mg by mouth every 8 (eight) hours as needed. pain      . Levothyroxine Sodium (LEVOTHROID PO) Take 1 tablet by mouth daily. Pt not sure of strength, pharmacy no longer active      . LISINOPRIL PO Take 1 tablet by mouth daily. Pt unsure of strength, pharmacy no longer active to verify      . naproxen sodium (ANAPROX) 220 MG tablet Take 440 mg by mouth daily as needed. pain        ROS As above in HPI  Physical Exam   Blood pressure 121/75, pulse 79, temperature 98 F (36.7 C), temperature source Oral, height 5\' 7"  (1.702 m), weight 193 lb 8 oz (87.771 kg).  Physical Exam  Constitutional: She is oriented to person, place, and time. She appears well-developed and well-nourished. No distress.  Cardiovascular: Normal rate.   Respiratory: Effort normal.  GI: Soft. She  exhibits no distension. There is no tenderness. There is no rebound and no guarding.  Musculoskeletal: Normal range of motion. She exhibits tenderness (over sciatic joints).       Normal gait, no obvious deformity   Neurological: She is alert and oriented to person, place, and time.  Skin: Skin is warm and dry.  Psychiatric: She has a normal mood and affect.   Results for orders placed during the hospital encounter of 07/20/12 (from the past 24 hour(s))  URINALYSIS, ROUTINE W REFLEX MICROSCOPIC     Status: Abnormal   Collection Time   07/20/12  7:52 PM      Component Value Range   Color, Urine YELLOW  YELLOW   APPearance CLEAR  CLEAR   Specific Gravity, Urine >1.030 (*) 1.005 - 1.030   pH 5.5  5.0 - 8.0   Glucose, UA NEGATIVE  NEGATIVE mg/dL   Hgb urine dipstick NEGATIVE  NEGATIVE   Bilirubin Urine NEGATIVE  NEGATIVE   Ketones, ur NEGATIVE  NEGATIVE mg/dL   Protein, ur NEGATIVE  NEGATIVE mg/dL   Urobilinogen, UA 0.2  0.0 - 1.0 mg/dL   Nitrite NEGATIVE  NEGATIVE  Leukocytes, UA NEGATIVE  NEGATIVE     MAU Course  Procedures  Assessment and Plan  A:  Left Sciatica      Chronic Back pain      NSAID abuse  P:  Discharge\       Rx Tramadol #30 with 1 refill       Rx Flexeril #30 with 1 refill for use at night       Referred to Sports Medicine      Advised she may want to apply for Medicaid/Medicare (has never tried to apply)   [facility] 07/20/2012, 8:40 PM

## 2012-07-20 NOTE — MAU Note (Signed)
Pt states, " I've had pain in my low back that shoots down my left leg. I haven't been abel to sleep for the past two nights.'

## 2012-07-23 ENCOUNTER — Other Ambulatory Visit: Payer: Self-pay | Admitting: Obstetrics and Gynecology

## 2012-07-23 MED ORDER — CYCLOBENZAPRINE HCL 10 MG PO TABS: 10.0000 mg | ORAL_TABLET | Freq: Three times a day (TID) | ORAL | Status: AC | PRN

## 2012-07-25 ENCOUNTER — Other Ambulatory Visit: Payer: Self-pay | Admitting: Obstetrics and Gynecology

## 2012-07-25 MED ORDER — TRAMADOL HCL 50 MG PO TABS: 50.0000 mg | ORAL_TABLET | Freq: Three times a day (TID) | ORAL | Status: AC | PRN

## 2013-09-06 ENCOUNTER — Emergency Department (HOSPITAL_COMMUNITY): Payer: Self-pay

## 2013-09-06 ENCOUNTER — Encounter (HOSPITAL_COMMUNITY): Payer: Self-pay | Admitting: Emergency Medicine

## 2013-09-06 ENCOUNTER — Emergency Department (HOSPITAL_COMMUNITY)
Admission: EM | Admit: 2013-09-06 | Discharge: 2013-09-06 | Disposition: A | Discharge status: Home / Self Care | Payer: Self-pay | Attending: Emergency Medicine | Admitting: Emergency Medicine

## 2013-09-06 ENCOUNTER — Emergency Department (HOSPITAL_COMMUNITY): Payer: No Typology Code available for payment source

## 2013-09-06 DIAGNOSIS — F17200 Nicotine dependence, unspecified, uncomplicated: Secondary | ICD-10-CM | POA: Insufficient documentation

## 2013-09-06 DIAGNOSIS — S335XXA Sprain of ligaments of lumbar spine, initial encounter: Secondary | ICD-10-CM | POA: Insufficient documentation

## 2013-09-06 DIAGNOSIS — S8000XA Contusion of unspecified knee, initial encounter: Secondary | ICD-10-CM | POA: Insufficient documentation

## 2013-09-06 DIAGNOSIS — S161XXA Strain of muscle, fascia and tendon at neck level, initial encounter: Secondary | ICD-10-CM

## 2013-09-06 DIAGNOSIS — R0789 Other chest pain: Secondary | ICD-10-CM

## 2013-09-06 DIAGNOSIS — Z79899 Other long term (current) drug therapy: Secondary | ICD-10-CM | POA: Insufficient documentation

## 2013-09-06 DIAGNOSIS — Y92410 Unspecified street and highway as the place of occurrence of the external cause: Secondary | ICD-10-CM | POA: Insufficient documentation

## 2013-09-06 DIAGNOSIS — M25512 Pain in left shoulder: Secondary | ICD-10-CM

## 2013-09-06 DIAGNOSIS — S139XXA Sprain of joints and ligaments of unspecified parts of neck, initial encounter: Secondary | ICD-10-CM | POA: Insufficient documentation

## 2013-09-06 DIAGNOSIS — Y9389 Activity, other specified: Secondary | ICD-10-CM | POA: Insufficient documentation

## 2013-09-06 DIAGNOSIS — IMO0002 Reserved for concepts with insufficient information to code with codable children: Secondary | ICD-10-CM | POA: Insufficient documentation

## 2013-09-06 DIAGNOSIS — S298XXA Other specified injuries of thorax, initial encounter: Secondary | ICD-10-CM | POA: Insufficient documentation

## 2013-09-06 DIAGNOSIS — S4980XA Other specified injuries of shoulder and upper arm, unspecified arm, initial encounter: Secondary | ICD-10-CM | POA: Insufficient documentation

## 2013-09-06 DIAGNOSIS — S8990XA Unspecified injury of unspecified lower leg, initial encounter: Secondary | ICD-10-CM | POA: Insufficient documentation

## 2013-09-06 DIAGNOSIS — S39012A Strain of muscle, fascia and tendon of lower back, initial encounter: Secondary | ICD-10-CM

## 2013-09-06 DIAGNOSIS — I10 Essential (primary) hypertension: Secondary | ICD-10-CM | POA: Insufficient documentation

## 2013-09-06 DIAGNOSIS — S46909A Unspecified injury of unspecified muscle, fascia and tendon at shoulder and upper arm level, unspecified arm, initial encounter: Secondary | ICD-10-CM | POA: Insufficient documentation

## 2013-09-06 DIAGNOSIS — S8002XA Contusion of left knee, initial encounter: Secondary | ICD-10-CM

## 2013-09-06 MED ORDER — CYCLOBENZAPRINE HCL 10 MG PO TABS: 10.0000 mg | ORAL_TABLET | Freq: Two times a day (BID) | ORAL | Status: DC | PRN

## 2013-09-06 MED ORDER — HYDROCODONE-ACETAMINOPHEN 5-325 MG PO TABS
1.0000 | ORAL_TABLET | Freq: Once | ORAL | Status: AC
  Administered 2013-09-06: 1 via ORAL
  Filled 2013-09-06: qty 1

## 2013-09-06 MED ORDER — HYDROCODONE-ACETAMINOPHEN 5-325 MG PO TABS: 1.0000 | ORAL_TABLET | ORAL | Status: DC | PRN
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
## 2013-09-06 NOTE — ED Notes (Signed)
Patient requesting something to eat & drink. Ok per [place], [place]. Patient provided grape juice and graham crackers

## 2013-09-06 NOTE — ED Notes (Signed)
[name] Calico, PA at bedside; removed C-Collar and discussing plan of care with patient & family
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
## 2013-09-08 NOTE — ED Provider Notes (Signed)
History/physical exam/procedure(s) were performed by non-physician practitioner and as supervising physician I was immediately available for consultation/collaboration. I have reviewed all notes and am in agreement with care and plan.   Hilario Quarry, MD 09/08/13 [phone]

## 2014-10-13 ENCOUNTER — Encounter (HOSPITAL_COMMUNITY): Payer: Self-pay | Admitting: Emergency Medicine

## 2015-01-25 ENCOUNTER — Emergency Department (HOSPITAL_COMMUNITY)
Admission: EM | Admit: 2015-01-25 | Discharge: 2015-01-26 | Disposition: A | Discharge status: Home / Self Care | Payer: Medicare Other | Attending: Emergency Medicine | Admitting: Emergency Medicine

## 2015-01-25 ENCOUNTER — Encounter (HOSPITAL_COMMUNITY): Payer: Self-pay | Admitting: *Deleted

## 2015-01-25 ENCOUNTER — Emergency Department (HOSPITAL_COMMUNITY): Payer: Medicare Other

## 2015-01-25 DIAGNOSIS — Z79899 Other long term (current) drug therapy: Secondary | ICD-10-CM | POA: Diagnosis not present

## 2015-01-25 DIAGNOSIS — R911 Solitary pulmonary nodule: Secondary | ICD-10-CM | POA: Insufficient documentation

## 2015-01-25 DIAGNOSIS — I10 Essential (primary) hypertension: Secondary | ICD-10-CM | POA: Diagnosis not present

## 2015-01-25 DIAGNOSIS — R0602 Shortness of breath: Secondary | ICD-10-CM | POA: Insufficient documentation

## 2015-01-25 DIAGNOSIS — R079 Chest pain, unspecified: Secondary | ICD-10-CM | POA: Diagnosis present

## 2015-01-25 DIAGNOSIS — R0789 Other chest pain: Secondary | ICD-10-CM | POA: Diagnosis not present

## 2015-01-25 DIAGNOSIS — Z720 Tobacco use: Secondary | ICD-10-CM | POA: Insufficient documentation

## 2015-01-25 DIAGNOSIS — Z9889 Other specified postprocedural states: Secondary | ICD-10-CM | POA: Insufficient documentation

## 2015-01-25 LAB — BASIC METABOLIC PANEL
Anion gap: 7 (ref 5–15)
BUN: 18 mg/dL (ref 6–23)
CALCIUM: 9.8 mg/dL (ref 8.4–10.5)
CHLORIDE: 105 mmol/L (ref 96–112)
CO2: 25 mmol/L (ref 19–32)
CREATININE: 1.06 mg/dL (ref 0.50–1.10)
GFR calc Af Amer: 64 mL/min — ABNORMAL LOW (ref >90)
GFR calc non Af Amer: 55 mL/min — ABNORMAL LOW (ref >90)
GLUCOSE: 114 mg/dL — AB (ref 70–99)
Potassium: 3.4 mmol/L — ABNORMAL LOW (ref 3.5–5.1)
Sodium: 137 mmol/L (ref 135–145)

## 2015-01-25 LAB — CBC
HCT: 38.3 % (ref 36.0–46.0)
HEMOGLOBIN: 12.8 g/dL (ref 12.0–15.0)
MCH: 34 pg (ref 26.0–34.0)
MCHC: 33.4 g/dL (ref 30.0–36.0)
MCV: 101.9 fL — ABNORMAL HIGH (ref 78.0–100.0)
PLATELETS: 178 10*3/uL (ref 150–400)
RBC: 3.76 MIL/uL — ABNORMAL LOW (ref 3.87–5.11)
RDW: 12.5 % (ref 11.5–15.5)
WBC: 11.3 10*3/uL — ABNORMAL HIGH (ref 4.0–10.5)

## 2015-01-25 LAB — LIPASE, BLOOD: Lipase: 40 U/L (ref 11–59)

## 2015-01-25 LAB — D-DIMER, QUANTITATIVE (NOT AT ARMC): D-Dimer, Quant: 0.97 ug/mL-FEU — ABNORMAL HIGH (ref 0.00–0.48)

## 2015-01-25 LAB — TROPONIN I: Troponin I: <0.03 ng/mL (ref <0.031)

## 2015-01-25 LAB — BRAIN NATRIURETIC PEPTIDE: B Natriuretic Peptide: 9.1 pg/mL (ref 0.0–100.0)

## 2015-01-25 MED ORDER — HYDROCODONE-ACETAMINOPHEN 5-325 MG PO TABS
1.0000 | ORAL_TABLET | Freq: Once | ORAL | Status: AC
  Administered 2015-01-25: 1 via ORAL
  Filled 2015-01-25: qty 1

## 2015-01-25 MED ORDER — IBUPROFEN 800 MG PO TABS
800.0000 mg | ORAL_TABLET | Freq: Once | ORAL | Status: AC
  Administered 2015-01-25: 800 mg via ORAL
  Filled 2015-01-25: qty 1

## 2015-01-25 NOTE — ED Notes (Signed)
The opt is c/o  Epigastric pain with rt sided upper  Rt chest pain with posterior radiation.  sob
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
## 2015-01-25 NOTE — ED Provider Notes (Signed)
CSN: [id number]     Arrival date & time 01/25/15  2012 History   First MD Initiated Contact with Patient 01/25/15 2037     Chief Complaint  Patient presents with  . Chest Pain     (Consider location/radiation/quality/duration/timing/severity/associated sxs/prior Treatment) HPI Patient presents to the emergency department with right-sided chest pain radiates to her right back, along the ribs since 1:30 this afternoon.  The patient states that it has been constant.  Patient states she did not take any medications prior to arrival for her pain.  The nurse mentions epigastric pain, but the patient is having right sided rib pain.  Patient states it hurts to take a deep breath, moves flat and cough.  Patient denies nausea, vomiting, diarrhea, abdominal pain, weakness, dizziness, headache, blurred vision, fever, cough, runny nose, sore throat, hematemesis, bloody stool or syncope.  The patient states that seems make her condition better Past Medical History  Diagnosis Date  . Hypertension    Past Surgical History  Procedure Laterality Date  . Eye surgery    . Cesarean section     No family history on file. History  Substance Use Topics  . Smoking status: Current Every Day Smoker -- 0.25 packs/day  . Smokeless tobacco: Not on file  . Alcohol Use: Yes     Comment: occational   OB History    Gravida Para Term Preterm AB TAB SAB Ectopic Multiple Living   2 1 1  1  1   1      Review of Systems  All other systems negative except as documented in the HPI. All pertinent positives and negatives as reviewed in the HPI.   Allergies  Review of patient's allergies indicates no known allergies.  Home Medications   Prior to Admission medications   Medication Sig Start Date End Date Taking? Authorizing Provider  GABAPENTIN PO Take 1 capsule by mouth daily as needed (pain).   Yes Historical Provider, MD  ibuprofen (ADVIL,MOTRIN) 800 MG tablet Take 800 mg by mouth 2 (two) times daily as needed  (pain).   Yes Historical Provider, MD  levothyroxine (SYNTHROID, LEVOTHROID) 125 MCG tablet Take 125 mcg by mouth daily before breakfast.   Yes Historical Provider, MD  lisinopril (PRINIVIL,ZESTRIL) 5 MG tablet Take 5 mg by mouth daily.   Yes Historical Provider, MD  naproxen sodium (ANAPROX) 220 MG tablet Take 440 mg by mouth 3 (three) times daily as needed (pain).   Yes Historical Provider, MD  PRESCRIPTION MEDICATION Take 1 tablet by mouth daily. Sinus medication   Yes Historical Provider, MD  cyclobenzaprine (FLEXERIL) 10 MG tablet Take 1 tablet (10 mg total) by mouth 2 (two) times daily as needed for muscle spasms. Patient not taking: Reported on 01/25/2015 09/06/13   Idalia Needle. Sanford, PA-C  HYDROcodone-acetaminophen (NORCO/VICODIN) 5-325 MG per tablet Take 1 tablet by mouth every 4 (four) hours as needed for pain. Patient not taking: Reported on 01/25/2015 09/06/13   Idalia Needle. Sanford, PA-C   BP 106/64 mmHg  Pulse 69  Resp 15  Ht 5\' 7"  (1.702 m)  Wt 193 lb (87.544 kg)  BMI 30.22 kg/m2  SpO2 96% Physical Exam  Constitutional: She appears well-developed and well-nourished. No distress.  HENT:  Head: Normocephalic and atraumatic.  Mouth/Throat: Oropharynx is clear and moist.  Eyes: Pupils are equal, round, and reactive to light.  Neck: Normal range of motion. Neck supple.  Cardiovascular: Normal rate and normal heart sounds.  Exam reveals no gallop and no friction rub.  No murmur heard. Pulmonary/Chest: Effort normal and breath sounds normal. No respiratory distress. She has no wheezes. She exhibits tenderness.  Abdominal: Soft. Bowel sounds are normal. She exhibits no distension. There is no tenderness. There is no rebound and no guarding.  Skin: Skin is warm and dry. No rash noted. No erythema.  Nursing note and vitals reviewed.   ED Course  Procedures (including critical care time) Labs Review Labs Reviewed  CBC - Abnormal; Notable for the following:    WBC 11.3 (*)    RBC  3.76 (*)    MCV 101.9 (*)    All other components within normal limits  BASIC METABOLIC PANEL - Abnormal; Notable for the following:    Potassium 3.4 (*)    Glucose, Bld 114 (*)    GFR calc non Af Amer 55 (*)    GFR calc Af Amer 64 (*)    All other components within normal limits  BRAIN NATRIURETIC PEPTIDE  LIPASE, BLOOD  TROPONIN I    Imaging Review Dg Chest 2 View  01/25/2015   CLINICAL DATA:  One day history of chest pain  EXAM: CHEST  2 VIEW  COMPARISON:  September 06, 2013  FINDINGS: There is minimal scarring in the left base. Lungs elsewhere clear. Heart size and pulmonary vascularity are normal. No adenopathy. There is mild degenerative change in the thoracic spine.  IMPRESSION: No edema or consolidation.   Electronically Signed   By: Rudi Heap III M.D.   On: 01/25/2015 20:42     EKG Interpretation   Date/Time:  Sunday January 25 2015 20:15:39 EST Ventricular Rate:  93 PR Interval:  190 QRS Duration: 86 QT Interval:  346 QTC Calculation: 430 R Axis:   51 Text Interpretation:  Normal sinus rhythm Possible Left atrial enlargement  Borderline ECG No significant change since last tracing Confirmed by YAO   MD, DAVID ([id number]) on 01/25/2015 8:36:11 PM      MDM   Final diagnoses:  None     The patient has no PE. The patient will be given follow up with a pulm specialist on the nodule. She is alerted to the risk that this could be cancer. The patient will be treated for chest wall pain.        Brent General, PA-C 01/26/15 0112  Wandra Arthurs, MD 01/28/15 [phone]

## 2015-01-25 NOTE — ED Notes (Signed)
The pt ate a hamburger and fries around 1600 before the pain started.  She still has her gb

## 2015-01-25 NOTE — ED Notes (Signed)
CT called and made aware of patient's IV.

## 2015-01-25 NOTE — ED Notes (Signed)
To x-ray

## 2015-01-25 NOTE — ED Notes (Signed)
Patient returned from X-ray 

## 2015-01-26 ENCOUNTER — Emergency Department (HOSPITAL_COMMUNITY): Payer: Medicare Other

## 2015-01-26 ENCOUNTER — Encounter (HOSPITAL_COMMUNITY): Payer: Self-pay | Admitting: Radiology

## 2015-01-26 LAB — URINALYSIS, ROUTINE W REFLEX MICROSCOPIC
Bilirubin Urine: NEGATIVE
Glucose, UA: NEGATIVE mg/dL
Hgb urine dipstick: NEGATIVE
Ketones, ur: NEGATIVE mg/dL
Leukocytes, UA: NEGATIVE
Nitrite: NEGATIVE
Protein, ur: NEGATIVE mg/dL
Specific Gravity, Urine: 1.02 (ref 1.005–1.030)
Urobilinogen, UA: 0.2 mg/dL (ref 0.0–1.0)
pH: 5 (ref 5.0–8.0)

## 2015-01-26 MED ORDER — HYDROMORPHONE HCL 1 MG/ML IJ SOLN
1.0000 mg | Freq: Once | INTRAMUSCULAR | Status: AC
  Administered 2015-01-26: 1 mg via INTRAVENOUS
  Filled 2015-01-26: qty 1

## 2015-01-26 MED ORDER — HYDROCODONE-ACETAMINOPHEN 5-325 MG PO TABS: 1.0000 | ORAL_TABLET | Freq: Four times a day (QID) | ORAL | Status: DC | PRN

## 2015-01-26 MED ORDER — IBUPROFEN 800 MG PO TABS: 800.0000 mg | ORAL_TABLET | Freq: Three times a day (TID) | ORAL | Status: AC | PRN

## 2015-01-26 MED ORDER — IOHEXOL 350 MG/ML SOLN
80.0000 mL | Freq: Once | INTRAVENOUS | Status: AC | PRN
  Administered 2015-01-26: 55 mL via INTRAVENOUS

## 2015-01-26 NOTE — ED Notes (Signed)
Patient transported to CT 

## 2015-01-26 NOTE — Discharge Instructions (Signed)
Follow-up with the, Dr. provided on the pulmonary nodule that was seen on your CT scan.  The CT scan cannot tell what type of nodule.  This could represent cancer and that is why follow-up is important.  There is no pulmonary embolus or blood clot noted on your CT scan.  Your other testing was normal

## 2015-07-14 ENCOUNTER — Encounter (HOSPITAL_COMMUNITY): Payer: Self-pay

## 2015-07-14 ENCOUNTER — Emergency Department (HOSPITAL_COMMUNITY): Payer: Medicare Other

## 2015-07-14 ENCOUNTER — Emergency Department (HOSPITAL_COMMUNITY)
Admission: EM | Admit: 2015-07-14 | Discharge: 2015-07-14 | Disposition: A | Discharge status: Home / Self Care | Payer: Medicare Other | Attending: Physician Assistant | Admitting: Physician Assistant

## 2015-07-14 DIAGNOSIS — S3992XA Unspecified injury of lower back, initial encounter: Secondary | ICD-10-CM | POA: Insufficient documentation

## 2015-07-14 DIAGNOSIS — S60312A Abrasion of left thumb, initial encounter: Secondary | ICD-10-CM | POA: Diagnosis not present

## 2015-07-14 DIAGNOSIS — S199XXA Unspecified injury of neck, initial encounter: Secondary | ICD-10-CM | POA: Diagnosis present

## 2015-07-14 DIAGNOSIS — Y939 Activity, unspecified: Secondary | ICD-10-CM | POA: Diagnosis not present

## 2015-07-14 DIAGNOSIS — Z79899 Other long term (current) drug therapy: Secondary | ICD-10-CM | POA: Insufficient documentation

## 2015-07-14 DIAGNOSIS — I10 Essential (primary) hypertension: Secondary | ICD-10-CM | POA: Insufficient documentation

## 2015-07-14 DIAGNOSIS — Z23 Encounter for immunization: Secondary | ICD-10-CM | POA: Insufficient documentation

## 2015-07-14 DIAGNOSIS — Z720 Tobacco use: Secondary | ICD-10-CM | POA: Insufficient documentation

## 2015-07-14 DIAGNOSIS — Y92410 Unspecified street and highway as the place of occurrence of the external cause: Secondary | ICD-10-CM | POA: Diagnosis not present

## 2015-07-14 DIAGNOSIS — E079 Disorder of thyroid, unspecified: Secondary | ICD-10-CM | POA: Diagnosis not present

## 2015-07-14 DIAGNOSIS — Y999 Unspecified external cause status: Secondary | ICD-10-CM | POA: Insufficient documentation

## 2015-07-14 HISTORY — DX: Disorder of thyroid, unspecified: E07.9

## 2015-07-14 MED ORDER — TETANUS-DIPHTH-ACELL PERTUSSIS 5-2.5-18.5 LF-MCG/0.5 IM SUSP
0.5000 mL | Freq: Once | INTRAMUSCULAR | Status: AC
  Administered 2015-07-14: 0.5 mL via INTRAMUSCULAR
  Filled 2015-07-14: qty 0.5
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
## 2015-07-14 NOTE — ED Notes (Signed)
Pt declined medications on several occasions when asked by EDP and by this Probation officer.

## 2015-07-14 NOTE — ED Notes (Signed)
MD at bedside. 
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
## 2015-07-20 ENCOUNTER — Encounter: Payer: Self-pay | Admitting: Gastroenterology

## 2015-08-04 ENCOUNTER — Other Ambulatory Visit: Payer: Self-pay | Admitting: Internal Medicine

## 2015-08-04 DIAGNOSIS — Z1231 Encounter for screening mammogram for malignant neoplasm of breast: Secondary | ICD-10-CM

## 2015-08-06 ENCOUNTER — Institutional Professional Consult (permissible substitution): Payer: Medicare Other | Admitting: Pulmonary Disease

## 2015-08-28 ENCOUNTER — Ambulatory Visit (INDEPENDENT_AMBULATORY_CARE_PROVIDER_SITE_OTHER): Payer: Medicare Other | Admitting: Internal Medicine

## 2015-08-28 ENCOUNTER — Other Ambulatory Visit: Payer: Medicare Other

## 2015-08-28 ENCOUNTER — Ambulatory Visit (INDEPENDENT_AMBULATORY_CARE_PROVIDER_SITE_OTHER)
Admission: RE | Admit: 2015-08-28 | Discharge: 2015-08-28 | Disposition: A | Discharge status: Home / Self Care | Payer: Medicare Other | Source: Ambulatory Visit | Attending: Internal Medicine | Admitting: Internal Medicine

## 2015-08-28 ENCOUNTER — Encounter: Payer: Self-pay | Admitting: Internal Medicine

## 2015-08-28 ENCOUNTER — Institutional Professional Consult (permissible substitution): Payer: Medicare Other | Admitting: Pulmonary Disease

## 2015-08-28 VITALS — BP 144/90 | HR 77 | Ht 67.0 in | Wt 198.0 lb

## 2015-08-28 DIAGNOSIS — Z720 Tobacco use: Secondary | ICD-10-CM

## 2015-08-28 DIAGNOSIS — R0600 Dyspnea, unspecified: Secondary | ICD-10-CM | POA: Diagnosis not present

## 2015-08-28 DIAGNOSIS — F17210 Nicotine dependence, cigarettes, uncomplicated: Secondary | ICD-10-CM | POA: Diagnosis not present

## 2015-08-28 DIAGNOSIS — E669 Obesity, unspecified: Secondary | ICD-10-CM

## 2015-08-28 DIAGNOSIS — I10 Essential (primary) hypertension: Secondary | ICD-10-CM | POA: Diagnosis not present

## 2015-08-28 MED ORDER — IRBESARTAN 75 MG PO TABS: 75.0000 mg | ORAL_TABLET | Freq: Every day | ORAL | Status: DC

## 2015-08-28 MED ORDER — IRBESARTAN 75 MG PO TABS: ORAL_TABLET | ORAL | Status: DC

## 2015-08-28 MED ORDER — FAMOTIDINE 20 MG PO TABS: ORAL_TABLET | ORAL | Status: AC

## 2015-08-28 NOTE — Progress Notes (Signed)
Subjective:    Patient ID: [name], female    DOB: 28-Oct-1951,     MRN: [id number]  HPI  18 yobf active smoker with onset of sinus problems around the age of 66 both nasal congestion and drainage then sob spring 2016 referred to pulmonary clinic by DR Kevan Ny 08/28/2015   08/28/2015 1st [facility] office visit/ Halona Amstutz   Chief Complaint  Patient presents with  . Follow-up    Pt referred by Dr. Marlou Sa for SOB. Pt c/o SOB and wheezing with exertion starting around 05/13/2015. Denies any cough, chest tightness/congestion.    indolent onset since spring 2016 progressive doe much worse since June 2016 where can't walk at [facility], ok at rest but some sense of choking/strangling at [facility] with nasal and throat congestion on ACEi since ? when  No obvious day to day or daytime variability or assoc chronic cough or cp or chest tightness,  or hb symptoms. No unusual exp hx or h/o childhood pna/ asthma or knowledge of premature birth.  .Also denies any obvious fluctuation of symptoms with weather or environmental changes or other aggravating or alleviating factors except as outlined above   Current Medications, Allergies, Complete Past Medical History, Past Surgical History, Family History, and Social History were reviewed in [employer] record.  ROS  The following are not active complaints unless bolded sore throat, dysphagia, dental problems, itching, sneezing,  nasal congestion or excess/ purulent secretions, ear ache,   fever, chills, sweats, unintended wt loss, classically pleuritic or exertional cp, hemoptysis,  orthopnea pnd or leg swelling, presyncope, palpitations, abdominal pain, anorexia, nausea, vomiting, diarrhea  or change in bowel or bladder habits, change in stools or urine, dysuria,hematuria,  rash, arthralgias, visual complaints, headache, numbness, weakness or ataxia or problems with walking or coordination,  change in mood/affect or memory.         Review of Systems  Constitutional: Negative for fever and unexpected weight change.  HENT: Positive for dental problem and sinus pressure. Negative for congestion, ear pain, postnasal drip, rhinorrhea, sneezing, sore throat and trouble swallowing.   Eyes: Negative for redness and itching.  Respiratory: Positive for shortness of breath and wheezing. Negative for cough and chest tightness.   Cardiovascular: Negative for palpitations and leg swelling.  Gastrointestinal: Negative for nausea and vomiting.  Genitourinary: Negative for dysuria.  Musculoskeletal: Negative for joint swelling.  Skin: Negative for rash.  Neurological: Negative for headaches.  Hematological: Does not bruise/bleed easily.  Psychiatric/Behavioral: Negative for dysphoric mood. The patient is not nervous/anxious.        Objective:   Physical Exam  amb bf with gruff voice nad  Wt Readings from Last 3 Encounters:  08/28/15 198 lb (89.812 kg)  07/14/15 190 lb (86.183 kg)  01/25/15 193 lb (87.544 kg)    Vital signs reviewed  HEENT: nl dentition, turbinates, and orophanx. Nl external ear canals without cough reflex   NECK :  without JVD/Nodes/TM/ nl carotid upstrokes bilaterally   LUNGS: no acc muscle use, clear to A and P bilaterally without cough on insp or exp maneuvers   CV:  RRR  no s3 or murmur or increase in P2, no edema   ABD:  soft and nontender with nl excursion in the supine position. No bruits or organomegaly, bowel sounds nl  MS:  warm without deformities, calf tenderness, cyanosis or clubbing  SKIN: warm and dry without lesions    NEURO:  alert, approp, no deficits   CXR  PA and Lateral:   08/28/2015 :     I personally reviewed images and agree with radiology impression as follows:    No active cardiopulmonary disease.           Assessment & Plan:

## 2015-08-28 NOTE — Patient Instructions (Addendum)
Stop lisinopril  Start pepcid 20 mg one at bedtime  GERD (REFLUX)  is an extremely common cause of respiratory symptoms just like yours , many times with no obvious heartburn at all.    It can be treated with medication, but also with lifestyle changes including elevation of the head of your bed (ideally with 6 inch  bed blocks),  Smoking cessation, avoidance of late meals, excessive alcohol, and avoid fatty foods, chocolate, peppermint, colas, red wine, and acidic juices such as orange juice.  NO MINT OR MENTHOL PRODUCTS SO NO COUGH DROPS  USE SUGARLESS CANDY INSTEAD (Jolley ranchers or Stover's or Life Savers) or even ice chips will also do - the key is to swallow to prevent all throat clearing. NO OIL BASED VITAMINS - use powdered substitutes.  Please remember to go to the   x-ray department downstairs for your tests - we will call you with the results when they are available.     Please schedule a follow up office visit in 6 weeks, call sooner if needed with pfts

## 2015-08-29 DIAGNOSIS — E669 Obesity, unspecified: Secondary | ICD-10-CM | POA: Insufficient documentation

## 2015-08-29 DIAGNOSIS — F17210 Nicotine dependence, cigarettes, uncomplicated: Secondary | ICD-10-CM | POA: Insufficient documentation

## 2015-08-29 NOTE — Assessment & Plan Note (Addendum)
ACE inhibitors are problematic in  pts with airway complaints because  even experienced pulmonologists can't always distinguish ace effects from copd/asthma/pnds/ allergies etc.  By themselves they don't actually cause a problem, much like oxygen can't by itself start a fire, but they certainly serve as a powerful catalyst or enhancer for any "fire"  or inflammatory process in the upper airway, be it caused by an ET  tube or more commonly reflux (especially in the obese or pts with known GERD or who are on biphoshonates) or URI's, due to interference with bradykinin clearance.  The effects of acei on bradykinin levels occurs in 100% of pt's on acei (unless they surreptitiously stop the med!) but the classic cough is only reported in 5%.  This leaves 95% of pts on acei's  with a variety of syndromes including no identifiable symptom in most  vs non-specific symptoms that wax and wane depending on what other insult is occuring at the level of the upper airway, either gerd or related to pnds   For now try off acei and on avapo 75 mg daily x 6 week min

## 2015-08-29 NOTE — Assessment & Plan Note (Signed)

## 2015-08-29 NOTE — Assessment & Plan Note (Signed)
Body mass index is 31 kg/(m^2).  Lab Results  Component Value Date   TSH 0.468 07/12/2010     Contributing to gerd tendency/ doe/reviewed need  achieve and maintain neg calorie balance > defer f/u primary care including intermittently monitoring thyroid status

## 2015-08-29 NOTE — Assessment & Plan Note (Addendum)
-   08/28/2015  Walked RA x 3 laps @ 185 ft each stopped due to  End of study, slow pace, no sob or desat    Symptoms are markedly disproportionate to objective findings and not clear this is a lung problem but pt does appear to have difficult airway management issues. DDX of  difficult airways management all start with A and  include Adherence, Ace Inhibitors, Acid Reflux, Active Sinus Disease, Alpha 1 Antitripsin deficiency, Anxiety masquerading as Airways dz,  ABPA,  allergy(esp in young), Aspiration (esp in elderly), Adverse effects of meds,  Active smokers, A bunch of PE's (a small clot burden can't cause this syndrome unless there is already severe underlying pulm or vascular dz with poor reserve) plus two Bs  = Bronchiectasis and Beta blocker use..and one C= CHF  Adherence is always the initial "prime suspect" and is a multilayered concern that requires a "trust but verify" approach in every patient - starting with knowing how to use medications, especially inhalers, correctly, keeping up with refills and understanding the fundamental difference between maintenance and prns vs those medications only taken for a very short course and then stopped and not refilled.   ACEi at top of the usual list of suspects and only way to exclude is stop (see hbp)  ? Acid (or non-acid) GERD > always difficult to exclude as up to 75% of pts in some series report no assoc GI/ Heartburn symptoms> rec max noct  acid suppression and diet restrictions/ reviewed and instructions given in writing.  ? Active sinus dz/ pnds > will add 1st gen h1 next ov if not better   Active smoking discussed sep a/p  I had an extended discussion with the patient reviewing all relevant studies completed to date and  lasting 80 m  Each maintenance medication was reviewed in detail including most importantly the difference between maintenance and prns and under what circumstances the prns are to be triggered using an action plan format that  is not reflected in the computer generated alphabetically organized AVS.    Please see instructions for details which were reviewed in writing and the patient given a copy highlighting the part that I personally wrote and discussed at today's ov.

## 2015-08-31 ENCOUNTER — Encounter: Payer: Self-pay | Admitting: Neurology

## 2015-08-31 ENCOUNTER — Ambulatory Visit (INDEPENDENT_AMBULATORY_CARE_PROVIDER_SITE_OTHER): Payer: Medicare Other | Admitting: Neurology

## 2015-08-31 VITALS — BP 140/90 | HR 66 | Ht 67.0 in | Wt 194.4 lb

## 2015-08-31 DIAGNOSIS — M9981 Other biomechanical lesions of cervical region: Secondary | ICD-10-CM

## 2015-08-31 DIAGNOSIS — M6289 Other specified disorders of muscle: Secondary | ICD-10-CM | POA: Diagnosis not present

## 2015-08-31 DIAGNOSIS — R292 Abnormal reflex: Secondary | ICD-10-CM | POA: Diagnosis not present

## 2015-08-31 DIAGNOSIS — R29898 Other symptoms and signs involving the musculoskeletal system: Secondary | ICD-10-CM

## 2015-08-31 DIAGNOSIS — M4802 Spinal stenosis, cervical region: Secondary | ICD-10-CM | POA: Diagnosis not present

## 2015-08-31 DIAGNOSIS — M542 Cervicalgia: Secondary | ICD-10-CM

## 2015-08-31 MED ORDER — AMITRIPTYLINE HCL 10 MG PO TABS: 10.0000 mg | ORAL_TABLET | Freq: Every day | ORAL | Status: DC
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
## 2015-08-31 NOTE — Progress Notes (Signed)
Quick Note:  Spoke with pt and notified of results per Dr. Wert. Pt verbalized understanding and denied any questions.  ______ 

## 2015-08-31 NOTE — Patient Instructions (Signed)
1.  MRI cervical spine wo contrast 2.  Start amitriptyline 10mg  at bedtime 3.  Pain Management referral

## 2015-08-31 NOTE — Progress Notes (Signed)
Note routed

## 2015-09-01 ENCOUNTER — Ambulatory Visit: Payer: Medicare Other

## 2015-09-09 ENCOUNTER — Ambulatory Visit: Payer: Medicare Other

## 2015-09-11 ENCOUNTER — Inpatient Hospital Stay: Admission: RE | Admit: 2015-09-11 | Payer: Medicare Other | Source: Ambulatory Visit

## 2015-09-14 ENCOUNTER — Encounter: Payer: Medicare Other | Admitting: Gastroenterology

## 2015-09-16 ENCOUNTER — Ambulatory Visit
Admission: RE | Admit: 2015-09-16 | Discharge: 2015-09-16 | Disposition: A | Discharge status: Home / Self Care | Payer: Medicare Other | Source: Ambulatory Visit | Attending: Internal Medicine | Admitting: Internal Medicine

## 2015-09-16 DIAGNOSIS — Z1231 Encounter for screening mammogram for malignant neoplasm of breast: Secondary | ICD-10-CM

## 2015-10-01 ENCOUNTER — Ambulatory Visit (AMBULATORY_SURGERY_CENTER): Payer: Self-pay | Admitting: *Deleted

## 2015-10-01 VITALS — Ht 67.0 in | Wt 195.0 lb

## 2015-10-01 DIAGNOSIS — Z1211 Encounter for screening for malignant neoplasm of colon: Secondary | ICD-10-CM

## 2015-10-01 MED ORDER — NA SULFATE-K SULFATE-MG SULF 17.5-3.13-1.6 GM/177ML PO SOLN: 1.0000 | Freq: Once | ORAL | Status: DC

## 2015-10-01 NOTE — Progress Notes (Signed)
No egg or soy allergy. No anesthesia problems.  Home O2.  No diet meds,

## 2015-10-01 NOTE — Addendum Note (Signed)
Addended by: [place] Bailiff D on: 10/01/2015 01:24 PM   Modules accepted: Orders

## 2015-10-12 ENCOUNTER — Ambulatory Visit: Payer: Medicare Other | Admitting: Internal Medicine

## 2015-10-15 ENCOUNTER — Encounter: Payer: Self-pay | Admitting: Gastroenterology

## 2015-10-15 ENCOUNTER — Ambulatory Visit (AMBULATORY_SURGERY_CENTER): Payer: Medicare Other | Admitting: Gastroenterology

## 2015-10-15 VITALS — BP 166/75 | HR 62 | Temp 97.3°F | Resp 15 | Ht 67.0 in | Wt 195.0 lb

## 2015-10-15 DIAGNOSIS — D122 Benign neoplasm of ascending colon: Secondary | ICD-10-CM

## 2015-10-15 DIAGNOSIS — D125 Benign neoplasm of sigmoid colon: Secondary | ICD-10-CM

## 2015-10-15 DIAGNOSIS — D128 Benign neoplasm of rectum: Secondary | ICD-10-CM | POA: Diagnosis not present

## 2015-10-15 DIAGNOSIS — Z1211 Encounter for screening for malignant neoplasm of colon: Secondary | ICD-10-CM

## 2015-10-15 MED ORDER — SODIUM CHLORIDE 0.9 % IV SOLN: 500.0000 mL | INTRAVENOUS | Status: DC

## 2015-10-15 NOTE — Progress Notes (Signed)
Called to room to assist during endoscopic procedure.  Patient ID and intended procedure confirmed with present staff. Received instructions for my participation in the procedure from the performing physician.  

## 2015-10-15 NOTE — Progress Notes (Signed)
Transferred to recovery room. A/O x3, pleased with MAC.  VSS.  Report to Penny, RN. 

## 2015-10-15 NOTE — Patient Instructions (Signed)
YOU HAD AN ENDOSCOPIC PROCEDURE TODAY AT [facility]:   Refer to the procedure report that was given to you for any specific questions about what was found during the examination.  If the procedure report does not answer your questions, please call your gastroenterologist to clarify.  If you requested that your care partner not be given the details of your procedure findings, then the procedure report has been included in a sealed envelope for you to review at your convenience later.  YOU SHOULD EXPECT: Some feelings of bloating in the abdomen. Passage of more gas than usual.  Walking can help get rid of the air that was put into your GI tract during the procedure and reduce the bloating. If you had a lower endoscopy (such as a colonoscopy or flexible sigmoidoscopy) you may notice spotting of blood in your stool or on the toilet paper. If you underwent a bowel prep for your procedure, you may not have a normal bowel movement for a few days.  Please Note:  You might notice some irritation and congestion in your nose or some drainage.  This is from the oxygen used during your procedure.  There is no need for concern and it should clear up in a day or so.  SYMPTOMS TO REPORT IMMEDIATELY:   Following lower endoscopy (colonoscopy or flexible sigmoidoscopy):  Excessive amounts of blood in the stool  Significant tenderness or worsening of abdominal pains  Swelling of the abdomen that is new, acute  Fever of 100F or higher   For urgent or emergent issues, a gastroenterologist can be reached at any hour by calling [phone].   DIET: Your first meal following the procedure should be a small meal and then it is ok to progress to your normal diet. Heavy or fried foods are harder to digest and may make you feel nauseous or bloated.  Likewise, meals heavy in dairy and vegetables can increase bloating.  Drink plenty of fluids but you should avoid alcoholic beverages for 24  hours.  ACTIVITY:  You should plan to take it easy for the rest of today and you should NOT DRIVE or use heavy machinery until tomorrow (because of the sedation medicines used during the test).    FOLLOW UP: Our staff will call the number listed on your records the next business day following your procedure to check on you and address any questions or concerns that you may have regarding the information given to you following your procedure. If we do not reach you, we will leave a message.  However, if you are feeling well and you are not experiencing any problems, there is no need to return our call.  We will assume that you have returned to your regular daily activities without incident.  If any biopsies were taken you will be contacted by phone or by letter within the next 1-3 weeks.  Please call us at [phone] if you have not heard about the biopsies in 3 weeks.    SIGNATURES/CONFIDENTIALITY: You and/or your care partner have signed paperwork which will be entered into your electronic medical record.  These signatures attest to the fact that that the information above on your After Visit Summary has been reviewed and is understood.  Full responsibility of the confidentiality of this discharge information lies with you and/or your care-partner.  Polyps, hemorrhoids-handouts given  Repeat colonoscopy will be determined by pathology.  Hold aspirin, aspirin products and NSAIDs (ibuprofen, motrin, advil, naproxen, aleve, etc)  for 2 week

## 2015-10-15 NOTE — Op Note (Signed)
[place]  [address] [place], [id number]   COLONOSCOPY PROCEDURE REPORT  PATIENT: [name], [name]  MR#: [id number] BIRTHDATE: [birth year] , 82  yrs. old GENDER: female ENDOSCOPIST: Yetta Flock, MD REFERRED BY: Kevan Ny MD PROCEDURE DATE:  10/15/2015 PROCEDURE:   Colonoscopy, screening, Colonoscopy with snare polypectomy, and Colonoscopy with biopsy First Screening Colonoscopy - Avg.  risk and is 50 yrs.  old or older Yes.  Prior Negative Screening - Now for repeat screening. N/A  History of Adenoma - Now for follow-up colonoscopy & has been > or = to 3 yrs.  N/A  Polyps removed today? Yes ASA CLASS:   Class II INDICATIONS:Screening for colonic neoplasia and Colorectal Neoplasm Risk Assessment for this procedure is average risk. MEDICATIONS: Propofol 350 mg IV  DESCRIPTION OF PROCEDURE:   After the risks benefits and alternatives of the procedure were thoroughly explained, informed consent was obtained.  The digital rectal exam revealed no abnormalities of the rectum.   The LB PCF Q180 [id number]  endoscope was introduced through the anus and advanced to the cecum, which was identified by both the appendix and ileocecal valve. No adverse events experienced.   The quality of the prep was adequate  The instrument was then slowly withdrawn as the colon was fully examined. Estimated blood loss is zero unless otherwise noted in this procedure report.   COLON FINDINGS: A 33mm sessile polyp was noted in the ascending colon and removed with cold snare.  A small lipoma was noted in the ascending colon.  Two small 58mm sessile polyps were noted in the sigmoid colon and removed with cold forceps.  A 63mm sessile polyp was noted in the rectum and removed with cold forceps.  The remainder of the colon appeared normal.  Retroflexed views revealed internal hemorrhoids. The time to cecum = 2.9 Withdrawal time = 27.1   The scope was withdrawn and the procedure  completed. COMPLICATIONS: There were no immediate complications.  ENDOSCOPIC IMPRESSION: Multiple polyps noted as above, removed. Small internal hemorrhoids   RECOMMENDATIONS: 1.  Hold Aspirin and all other NSAIDS for 2 weeks. 2.  Await pathology results 3.  Resume diet 4.  Resume medications  eSigned:  Yetta Flock, MD 10/15/2015 12:34 PM   cc: Kevan Ny MD, the patient   PATIENT NAME:  [name], Cooley MR#: [id number]

## 2015-10-16 ENCOUNTER — Telehealth: Payer: Self-pay | Admitting: *Deleted

## 2015-10-16 NOTE — Telephone Encounter (Signed)
  Follow up Call-  Call back number 10/15/2015  Post procedure Call Back phone  # [phone]  Permission to leave phone message Yes     Patient questions:  Do you have a fever, pain , or abdominal swelling? No. Pain Score  0 *  Have you tolerated food without any problems? Yes.    Have you been able to return to your normal activities? Yes.    Do you have any questions about your discharge instructions: Diet   No. Medications  No. Follow up visit  No.  Do you have questions or concerns about your Care? No.  Actions: * If pain score is 4 or above: No action needed, pain <4.

## 2015-10-18 ENCOUNTER — Encounter: Payer: Self-pay | Admitting: Gastroenterology

## 2015-10-29 ENCOUNTER — Telehealth: Payer: Self-pay | Admitting: Gastroenterology

## 2015-10-29 DIAGNOSIS — K921 Melena: Secondary | ICD-10-CM

## 2015-10-29 NOTE — Telephone Encounter (Signed)
Lab in EPIC. Left a message for patient to call back. 

## 2015-10-29 NOTE — Telephone Encounter (Signed)
Spoke with patient and she is not taking iron or Pepto bismol. She states she does take Ibuprofen 800 mg TID for back pain. She has taken this for a long time. She did not stop it after the procedure. She will try to hold it until she has labs done for [place]. She will come in AM for CBC. She cannot come before this time. She did verbalize again that the stools are formed, black not loose or tarry.

## 2015-10-29 NOTE — Telephone Encounter (Signed)
Is she taking anything to make the stools dark? If stools are formed and not tarry / sticky or loose would seem less likely to be blood. She had a few small polyps removed, all cold without cautery, risk of bleeding would be low from this procedure. We can check a CBC to ensure normal. Please review medications with her and any other ingestions otherwise and that she is not taking any NSAIDs. Thanks

## 2015-10-29 NOTE — Telephone Encounter (Signed)
Patient had colonoscopy on 10/15/15. She states she has had black stools for the last week. Stools are solid. Denies abdominal pain, cramping or fever. Please, advise.

## 2015-10-30 NOTE — Telephone Encounter (Signed)
I called the patient back. She did not come to the lab today. She reported passing brown stool today and stool is no longer dark. I tried to clarify how her stool looked earlier this week, she reported dark formed stool, did not sound consistent with melena but hard to say over the phone without examination. She has been taking ibuprofen TID and has since stopped it. She was counseled to stop this after her colonoscopy 2 weeks ago. If her stools remain brown she will continue to monitor and I don't think needs further evaluation as she otherwise states she feels well without complaints. If her stool becomes dark she will come in for the CBC and call us. If this occurs over the weekend she would have to go to the ER. Recommend she avoid NSAIDs and take the Pepcid that was prescribed for her, as she has not been taking this otherwise. She agreed and will contact us otherwise with recurrence of symptoms.

## 2015-11-24 ENCOUNTER — Other Ambulatory Visit: Payer: Self-pay | Admitting: Internal Medicine

## 2015-11-30 ENCOUNTER — Ambulatory Visit: Payer: Medicare Other | Admitting: Internal Medicine

## 2015-12-30 ENCOUNTER — Other Ambulatory Visit: Payer: Self-pay | Admitting: Internal Medicine

## 2016-01-26 ENCOUNTER — Emergency Department (HOSPITAL_COMMUNITY): Payer: Medicare Other

## 2016-01-26 ENCOUNTER — Emergency Department (HOSPITAL_COMMUNITY)
Admission: EM | Admit: 2016-01-26 | Discharge: 2016-01-26 | Disposition: A | Discharge status: Home / Self Care | Payer: Medicare Other | Attending: Emergency Medicine | Admitting: Emergency Medicine

## 2016-01-26 ENCOUNTER — Encounter (HOSPITAL_COMMUNITY): Payer: Self-pay

## 2016-01-26 DIAGNOSIS — R531 Weakness: Secondary | ICD-10-CM | POA: Diagnosis not present

## 2016-01-26 DIAGNOSIS — Z7951 Long term (current) use of inhaled steroids: Secondary | ICD-10-CM | POA: Insufficient documentation

## 2016-01-26 DIAGNOSIS — M1990 Unspecified osteoarthritis, unspecified site: Secondary | ICD-10-CM | POA: Insufficient documentation

## 2016-01-26 DIAGNOSIS — R140 Abdominal distension (gaseous): Secondary | ICD-10-CM | POA: Diagnosis not present

## 2016-01-26 DIAGNOSIS — R42 Dizziness and giddiness: Secondary | ICD-10-CM | POA: Insufficient documentation

## 2016-01-26 DIAGNOSIS — R6883 Chills (without fever): Secondary | ICD-10-CM | POA: Diagnosis not present

## 2016-01-26 DIAGNOSIS — F329 Major depressive disorder, single episode, unspecified: Secondary | ICD-10-CM | POA: Diagnosis not present

## 2016-01-26 DIAGNOSIS — F17210 Nicotine dependence, cigarettes, uncomplicated: Secondary | ICD-10-CM | POA: Insufficient documentation

## 2016-01-26 DIAGNOSIS — K219 Gastro-esophageal reflux disease without esophagitis: Secondary | ICD-10-CM | POA: Insufficient documentation

## 2016-01-26 DIAGNOSIS — Z79899 Other long term (current) drug therapy: Secondary | ICD-10-CM | POA: Insufficient documentation

## 2016-01-26 DIAGNOSIS — R0602 Shortness of breath: Secondary | ICD-10-CM | POA: Insufficient documentation

## 2016-01-26 DIAGNOSIS — E079 Disorder of thyroid, unspecified: Secondary | ICD-10-CM | POA: Diagnosis not present

## 2016-01-26 DIAGNOSIS — I10 Essential (primary) hypertension: Secondary | ICD-10-CM | POA: Diagnosis not present

## 2016-01-26 DIAGNOSIS — R1032 Left lower quadrant pain: Secondary | ICD-10-CM | POA: Diagnosis not present

## 2016-01-26 DIAGNOSIS — R109 Unspecified abdominal pain: Secondary | ICD-10-CM | POA: Diagnosis present

## 2016-01-26 LAB — CBC
HEMATOCRIT: 40.3 % (ref 36.0–46.0)
Hemoglobin: 13.2 g/dL (ref 12.0–15.0)
MCH: 34.3 pg — AB (ref 26.0–34.0)
MCHC: 32.8 g/dL (ref 30.0–36.0)
MCV: 104.7 fL — AB (ref 78.0–100.0)
PLATELETS: 292 10*3/uL (ref 150–400)
RBC: 3.85 MIL/uL — AB (ref 3.87–5.11)
RDW: 14 % (ref 11.5–15.5)
WBC: 13.4 10*3/uL — AB (ref 4.0–10.5)

## 2016-01-26 LAB — COMPREHENSIVE METABOLIC PANEL
ALT: 15 U/L (ref 14–54)
ANION GAP: 14 (ref 5–15)
AST: 18 U/L (ref 15–41)
Albumin: 3.6 g/dL (ref 3.5–5.0)
Alkaline Phosphatase: 103 U/L (ref 38–126)
BUN: 16 mg/dL (ref 6–20)
CHLORIDE: 110 mmol/L (ref 101–111)
CO2: 22 mmol/L (ref 22–32)
Calcium: 10.5 mg/dL — ABNORMAL HIGH (ref 8.9–10.3)
Creatinine, Ser: 1.06 mg/dL — ABNORMAL HIGH (ref 0.44–1.00)
GFR calc non Af Amer: 55 mL/min — ABNORMAL LOW (ref >60)
Glucose, Bld: 111 mg/dL — ABNORMAL HIGH (ref 65–99)
Potassium: 4.1 mmol/L (ref 3.5–5.1)
Sodium: 146 mmol/L — ABNORMAL HIGH (ref 135–145)
Total Bilirubin: 0.7 mg/dL (ref 0.3–1.2)
Total Protein: 7.9 g/dL (ref 6.5–8.1)

## 2016-01-26 LAB — URINALYSIS, ROUTINE W REFLEX MICROSCOPIC
Bilirubin Urine: NEGATIVE
GLUCOSE, UA: NEGATIVE mg/dL
Ketones, ur: NEGATIVE mg/dL
Nitrite: NEGATIVE
PH: 5 (ref 5.0–8.0)
PROTEIN: NEGATIVE mg/dL
SPECIFIC GRAVITY, URINE: 1.008 (ref 1.005–1.030)

## 2016-01-26 LAB — POC OCCULT BLOOD, ED: Fecal Occult Bld: NEGATIVE

## 2016-01-26 LAB — LIPASE, BLOOD: LIPASE: 57 U/L — AB (ref 11–51)

## 2016-01-26 LAB — URINE MICROSCOPIC-ADD ON

## 2016-01-26 MED ORDER — IOHEXOL 300 MG/ML  SOLN
100.0000 mL | Freq: Once | INTRAMUSCULAR | Status: AC | PRN
  Administered 2016-01-26: 100 mL via INTRAVENOUS

## 2016-01-26 MED ORDER — MORPHINE SULFATE (PF) 4 MG/ML IV SOLN
4.0000 mg | Freq: Once | INTRAVENOUS | Status: AC
  Administered 2016-01-26: 4 mg via INTRAVENOUS
  Filled 2016-01-26: qty 1

## 2016-01-26 MED ORDER — NITROFURANTOIN MONOHYD MACRO 100 MG PO CAPS: 100.0000 mg | ORAL_CAPSULE | Freq: Two times a day (BID) | ORAL | Status: DC

## 2016-01-26 MED ORDER — SODIUM CHLORIDE 0.9 % IV BOLUS (SEPSIS)
1000.0000 mL | Freq: Once | INTRAVENOUS | Status: AC
  Administered 2016-01-26: 1000 mL via INTRAVENOUS

## 2016-01-26 MED ORDER — ONDANSETRON HCL 4 MG/2ML IJ SOLN
4.0000 mg | Freq: Once | INTRAMUSCULAR | Status: AC
  Administered 2016-01-26: 4 mg via INTRAVENOUS
  Filled 2016-01-26: qty 2

## 2016-01-26 MED ORDER — HYDROCODONE-ACETAMINOPHEN 5-325 MG PO TABS: 1.0000 | ORAL_TABLET | Freq: Four times a day (QID) | ORAL | Status: DC | PRN

## 2016-01-26 NOTE — Discharge Instructions (Signed)
Return here as needed.  Follow-up with her primary care Dr. increase her fluid intake and rest as much as possible °

## 2016-01-26 NOTE — ED Notes (Signed)
Pt here with c/o abdominal pain. She also reports that her stools have been black since yesterday. Pt denies vomiting but does endorse some vomiting.

## 2016-01-26 NOTE — ED Provider Notes (Signed)
CSN: [id number]     Arrival date & time 01/26/16  0806 History   First MD Initiated Contact with Patient 01/26/16 [phone]     Chief Complaint  Patient presents with  . Abdominal Pain     (Consider location/radiation/quality/duration/timing/severity/associated sxs/prior Treatment) HPI Patient presents to the Emergency Department complaining of abdominal pain. Patient has a PMH significant for hypertension, hypothyroidism, GERD and is a current smoker (0.5PPD). Patient had colonoscopy in 08/2015 which revealed a lipoma and four polyps were removed at this time. The abdominal pain began 3 days ago and is intermittent crampy abdominal pain. She states that is a 7/10 sometimes worse. She takes ibuprofen occasionally for pain. She states that it has progressively gotten worse over the past three days. She endorses having 4-5 black stools last night. She endorses chills, SOB (she attributes it to smoking), weakness, lightheadedness, and nausea. She denies chest pain, hematochezia, diarrhea, vomiting, fevers, syncope, cough, congestion, sore throat, hematuria, myalgias, fever, diaphoresis, appetite change.  Past Medical History  Diagnosis Date  . Hypertension   . Thyroid disease   . Tobacco abuse   . Depression   . Arthritis   . GERD (gastroesophageal reflux disease)    Past Surgical History  Procedure Laterality Date  . Eye surgery    . Cesarean section     Family History  Problem Relation Age of Onset  . Breast cancer Mother   . Throat cancer Brother   . Healthy Daughter   . Esophageal cancer Brother 69  . Esophageal cancer Father   . Colon cancer Neg Hx    Social History  Substance Use Topics  . Smoking status: Current Every Day Smoker -- 0.50 packs/day for 40 years    Types: Cigarettes  . Smokeless tobacco: Never Used  . Alcohol Use: 0.0 oz/week    0 Standard drinks or equivalent per week     Comment: occational   OB History    Gravida Para Term Preterm AB TAB SAB Ectopic  Multiple Living   2 1 1  1  1   1      Review of Systems    Allergies  Review of patient's allergies indicates no known allergies.  Home Medications   Prior to Admission medications   Medication Sig Start Date End Date Taking? Authorizing Provider  amitriptyline (ELAVIL) 10 MG tablet Take 1 tablet (10 mg total) by mouth at bedtime. 08/31/15   Alda Berthold, DO  famotidine (PEPCID) 20 MG tablet One at bedtime 08/28/15   Tanda Rockers, MD  Fluticasone-Salmeterol (ADVAIR) 250-50 MCG/DOSE AEPB Inhale 1 puff into the lungs 2 (two) times daily.    Historical Provider, MD  gabapentin (NEURONTIN) 300 MG capsule Take 300 mg by mouth 3 (three) times daily.    Historical Provider, MD  ibuprofen (ADVIL,MOTRIN) 800 MG tablet Take 1 tablet (800 mg total) by mouth every 8 (eight) hours as needed. Patient taking differently: Take 800 mg by mouth every 8 (eight) hours as needed for fever, headache, mild pain, moderate pain or cramping.  01/26/15   Dalia Heading, PA-C  irbesartan (AVAPRO) 75 MG tablet TAKE ONE TABLET BY MOUTH ONCE DAILY 12/30/15   Tanda Rockers, MD  levothyroxine (SYNTHROID, LEVOTHROID) 125 MCG tablet Take 125 mcg by mouth daily before breakfast.    Historical Provider, MD  PREVIDENT 5000 SENSITIVE 1.1-5 % PSTE  09/14/15   Historical Provider, MD   BP 140/76 mmHg  Pulse 69  Temp(Src) 97.9 F (36.6 C) (Oral)  Resp 16  SpO2 100% Physical Exam  Constitutional: She is oriented to person, place, and time. She appears well-developed and well-nourished. No distress.  HENT:  Head: Normocephalic and atraumatic.  Right Ear: External ear normal.  Left Ear: External ear normal.  Eyes: Conjunctivae and EOM are normal. Pupils are equal, round, and reactive to light. Right eye exhibits no discharge. Left eye exhibits no discharge.  Neck: Normal range of motion. Neck supple.  Cardiovascular: Normal rate, regular rhythm and intact distal pulses.  Exam reveals no gallop and no friction rub.    No murmur heard. Pulmonary/Chest: Breath sounds normal. No respiratory distress. She has no wheezes. She has no rales. She exhibits no tenderness.  Abdominal: Soft. Bowel sounds are normal. She exhibits distension. She exhibits no mass. There is tenderness (primarily left lower quadrant tenderness to palpation). There is guarding. There is no rebound.  Genitourinary: Rectal exam shows no external hemorrhoid, no internal hemorrhoid and no tenderness.  Musculoskeletal: Normal range of motion. She exhibits no edema.  Neurological: She is alert and oriented to person, place, and time.  Skin: Skin is warm and dry. No rash noted. She is not diaphoretic. No erythema.  Psychiatric: She has a normal mood and affect. Her behavior is normal. Thought content normal.    ED Course  Procedures (including critical care time) Labs Review Labs Reviewed  LIPASE, BLOOD - Abnormal; Notable for the following:    Lipase 57 (*)    All other components within normal limits  COMPREHENSIVE METABOLIC PANEL - Abnormal; Notable for the following:    Sodium 146 (*)    Glucose, Bld 111 (*)    Creatinine, Ser 1.06 (*)    Calcium 10.5 (*)    GFR calc non Af Amer 55 (*)    All other components within normal limits  CBC - Abnormal; Notable for the following:    WBC 13.4 (*)    RBC 3.85 (*)    MCV 104.7 (*)    MCH 34.3 (*)    All other components within normal limits  URINALYSIS, ROUTINE W REFLEX MICROSCOPIC (NOT AT [facility]) - Abnormal; Notable for the following:    Hgb urine dipstick SMALL (*)    Leukocytes, UA TRACE (*)    All other components within normal limits  URINE MICROSCOPIC-ADD ON - Abnormal; Notable for the following:    Squamous Epithelial / LPF 0-5 (*)    Bacteria, UA FEW (*)    All other components within normal limits  URINE CULTURE  POC OCCULT BLOOD, ED    Imaging Review Ct Abdomen Pelvis W Contrast  01/26/2016  CLINICAL DATA:  Lower abdominal pain with dark stool a nausea for 3 days. EXAM: CT  ABDOMEN AND PELVIS WITH CONTRAST TECHNIQUE: Multidetector CT imaging of the abdomen and pelvis was performed using the standard protocol following bolus administration of intravenous contrast. CONTRAST:  170mL OMNIPAQUE IOHEXOL 300 MG/ML  SOLN COMPARISON:  03/01/2010 FINDINGS: Lung bases are clear.  No effusions.  Heart is normal size. Liver, gallbladder, spleen, pancreas, adrenals and left kidney are unremarkable. There are subtle areas of decreased enhancement within the right kidney. This has a striated nephrogram appearance on the delayed renal images, best seen in the lower pole. Cannot exclude pyelonephritis. Recommend clinical correlation. Appendix is normal. Stomach, large and small bowel unremarkable. No free fluid, free air or adenopathy. Scattered aortic and iliac calcifications without aneurysm. Prior hysterectomy. No adnexal masses. Urinary bladder is unremarkable. Degenerative changes in the lower lumbar spine.  IMPRESSION: Subtle areas of decreased enhancement within the right kidney, best seen in the lower pole. Cannot exclude pyelonephritis. Recommend clinical correlation. Electronically Signed   By: Rolm Baptise M.D.   On: 01/26/2016 09:39   I have personally reviewed and evaluated these images and lab results as part of my medical decision-making.   Patient is advised of the results and we will have her follow-up with her primary care doctor, told to return here as needed.  There is no definitive cause for abdominal pain.  I have asked the patient.  This could be an evolving process and to return here for any worsening in her condition    Dalia Heading, PA-C 01/27/16 1941  Elnora Morrison, MD 01/31/16 [phone]

## 2016-01-26 NOTE — ED Notes (Signed)
Pt transported to CT ?

## 2016-01-29 LAB — URINE CULTURE: Culture: >=100000

## 2016-01-30 ENCOUNTER — Telehealth (HOSPITAL_COMMUNITY): Payer: Self-pay

## 2016-01-30 NOTE — Telephone Encounter (Signed)
Post ED Visit - Positive Culture Follow-up  Culture report reviewed by antimicrobial stewardship pharmacist:  []  Elenor Quinones, Pharm.D. []  Heide Guile, Pharm.D., BCPS []  Parks Neptune, Pharm.D. []  Alycia Rossetti, Pharm.D., BCPS []  Pierce, Pharm.D., BCPS, AAHIVP []  Legrand Como, Pharm.D., BCPS, AAHIVP [x]  Milus Glazier, Pharm.D. []  Stephens November, Pharm.D.  Positive urine culture Treated with macrobid, organism sensitive to the same and no further patient follow-up is required at this time.  Ileene Musa 01/30/2016, 11:00 AM

## 2016-02-08 ENCOUNTER — Other Ambulatory Visit: Payer: Self-pay | Admitting: Internal Medicine

## 2016-03-02 ENCOUNTER — Other Ambulatory Visit: Payer: Self-pay | Admitting: Internal Medicine

## 2016-03-02 NOTE — Telephone Encounter (Signed)
Received refill request for  Ibesartan 75mg  Take 1 tablet by mouth daily  Rx was denied. Pt needs follow up ov in order to receive refill.  Form was refaxed to [phone] Nothing further needed.

## 2016-08-03 ENCOUNTER — Ambulatory Visit (HOSPITAL_COMMUNITY)
Admission: RE | Admit: 2016-08-03 | Discharge: 2016-08-03 | Disposition: A | Discharge status: Home / Self Care | Payer: Medicare Other | Source: Ambulatory Visit | Attending: Internal Medicine | Admitting: Internal Medicine

## 2016-08-03 ENCOUNTER — Other Ambulatory Visit (HOSPITAL_COMMUNITY): Payer: Self-pay | Admitting: Internal Medicine

## 2016-08-03 DIAGNOSIS — M5134 Other intervertebral disc degeneration, thoracic region: Secondary | ICD-10-CM | POA: Diagnosis not present

## 2016-08-03 DIAGNOSIS — I708 Atherosclerosis of other arteries: Secondary | ICD-10-CM | POA: Diagnosis not present

## 2016-08-03 DIAGNOSIS — M5137 Other intervertebral disc degeneration, lumbosacral region: Secondary | ICD-10-CM | POA: Insufficient documentation

## 2016-08-03 DIAGNOSIS — I700 Atherosclerosis of aorta: Secondary | ICD-10-CM | POA: Insufficient documentation

## 2016-08-03 DIAGNOSIS — M549 Dorsalgia, unspecified: Secondary | ICD-10-CM | POA: Insufficient documentation

## 2016-08-03 DIAGNOSIS — M4807 Spinal stenosis, lumbosacral region: Secondary | ICD-10-CM | POA: Insufficient documentation

## 2016-08-03 DIAGNOSIS — R52 Pain, unspecified: Secondary | ICD-10-CM

## 2016-11-07 ENCOUNTER — Encounter (HOSPITAL_COMMUNITY): Payer: Self-pay

## 2016-11-07 ENCOUNTER — Emergency Department (HOSPITAL_COMMUNITY)
Admission: EM | Admit: 2016-11-07 | Discharge: 2016-11-07 | Disposition: A | Discharge status: Home / Self Care | Payer: Medicare Other | Attending: Emergency Medicine | Admitting: Emergency Medicine

## 2016-11-07 ENCOUNTER — Emergency Department (HOSPITAL_COMMUNITY): Payer: Medicare Other

## 2016-11-07 DIAGNOSIS — Z79899 Other long term (current) drug therapy: Secondary | ICD-10-CM | POA: Insufficient documentation

## 2016-11-07 DIAGNOSIS — I10 Essential (primary) hypertension: Secondary | ICD-10-CM | POA: Diagnosis not present

## 2016-11-07 DIAGNOSIS — E039 Hypothyroidism, unspecified: Secondary | ICD-10-CM | POA: Insufficient documentation

## 2016-11-07 DIAGNOSIS — R0781 Pleurodynia: Secondary | ICD-10-CM | POA: Diagnosis present

## 2016-11-07 DIAGNOSIS — R079 Chest pain, unspecified: Secondary | ICD-10-CM

## 2016-11-07 DIAGNOSIS — F17210 Nicotine dependence, cigarettes, uncomplicated: Secondary | ICD-10-CM | POA: Diagnosis not present

## 2016-11-07 LAB — I-STAT TROPONIN, ED
TROPONIN I, POC: 0.01 ng/mL (ref 0.00–0.08)
TROPONIN I, POC: 0 ng/mL (ref 0.00–0.08)

## 2016-11-07 LAB — BASIC METABOLIC PANEL
ANION GAP: 9 (ref 5–15)
BUN: 20 mg/dL (ref 6–20)
CHLORIDE: 110 mmol/L (ref 101–111)
CO2: 24 mmol/L (ref 22–32)
CREATININE: 0.97 mg/dL (ref 0.44–1.00)
Calcium: 9.5 mg/dL (ref 8.9–10.3)
GFR calc non Af Amer: >60 mL/min (ref >60)
Glucose, Bld: 114 mg/dL — ABNORMAL HIGH (ref 65–99)
Potassium: 4.6 mmol/L (ref 3.5–5.1)
SODIUM: 143 mmol/L (ref 135–145)

## 2016-11-07 LAB — CBC
HCT: 40.7 % (ref 36.0–46.0)
HEMOGLOBIN: 13.6 g/dL (ref 12.0–15.0)
MCH: 35.1 pg — AB (ref 26.0–34.0)
MCHC: 33.4 g/dL (ref 30.0–36.0)
MCV: 104.9 fL — AB (ref 78.0–100.0)
PLATELETS: 161 10*3/uL (ref 150–400)
RBC: 3.88 MIL/uL (ref 3.87–5.11)
RDW: 13.2 % (ref 11.5–15.5)
WBC: 9.3 10*3/uL (ref 4.0–10.5)

## 2016-11-07 LAB — D-DIMER, QUANTITATIVE (NOT AT ARMC): D DIMER QUANT: 0.45 ug{FEU}/mL (ref 0.00–0.50)

## 2016-11-07 LAB — TSH: TSH: 0.335 u[IU]/mL — AB (ref 0.350–4.500)

## 2016-11-07 MED ORDER — CYCLOBENZAPRINE HCL 10 MG PO TABS: 10.0000 mg | ORAL_TABLET | Freq: Two times a day (BID) | ORAL | 0 refills | Status: DC | PRN

## 2016-11-07 MED ORDER — HYDROCODONE-ACETAMINOPHEN 5-325 MG PO TABS
1.0000 | ORAL_TABLET | Freq: Once | ORAL | Status: AC
  Administered 2016-11-07: 1 via ORAL
  Filled 2016-11-07: qty 1

## 2016-11-07 MED ORDER — OXYCODONE-ACETAMINOPHEN 5-325 MG PO TABS: 1.0000 | ORAL_TABLET | Freq: Four times a day (QID) | ORAL | 0 refills | Status: DC | PRN

## 2016-11-07 MED ORDER — CYCLOBENZAPRINE HCL 10 MG PO TABS
5.0000 mg | ORAL_TABLET | Freq: Once | ORAL | Status: AC
  Administered 2016-11-07: 5 mg via ORAL
  Filled 2016-11-07: qty 1

## 2016-11-07 MED ORDER — ASPIRIN 81 MG PO CHEW
324.0000 mg | CHEWABLE_TABLET | Freq: Once | ORAL | Status: AC
  Administered 2016-11-07: 324 mg via ORAL
  Filled 2016-11-07: qty 4

## 2016-11-07 NOTE — ED Triage Notes (Signed)
Chest pain since 2 aM Raditating to back woke pt from sleep.

## 2016-11-07 NOTE — Discharge Instructions (Signed)
Please follow-up with a primary care physician for further management of your chest pain. If symptoms return or worsen, please return to the nearest emergency department. Please take your pain and muscle medications as needed to help with your symptoms.

## 2016-11-07 NOTE — ED Provider Notes (Signed)
[facility] DEPT Provider Note   CSN: [id number] Arrival date & time: 11/07/16  0702     History   Chief Complaint Chief Complaint  Patient presents with  . Chest Pain    HPI [name] is a 65 y.o. female with a past medical history significant for depression, GERD, hypertension, thyroid disease, and tobacco abuse who presents with chest pain. Patient reports that she was of normal health when last night however she woke up to him this morning with right-sided chest pain. She describes the pain as pleuritic, constant, 9 out of 10 severity, shooting and sharp, and located in the right chest around towards the back. Patient denies any recent fevers, chills, shortness of breath, cough, rhinorrhea, congestion, nausea, vomiting. Patient denies any other symptoms including no abdominal pain, constant, diarrhea, dysuria. She does report some mild fatigue.     The history is provided by the patient and medical records. No language interpreter was used.  Chest Pain   This is a new problem. The current episode started 6 to 12 hours ago. The problem occurs constantly. The problem has not changed since onset.The pain is associated with raising an arm and breathing. The pain is present in the lateral region. The pain is at a severity of 9/10. The pain is severe. The quality of the pain is described as pleuritic and sharp. The pain does not radiate. Duration of episode(s) is 6 hours. The symptoms are aggravated by deep breathing and certain positions. Associated symptoms include back pain. Pertinent negatives include no abdominal pain, no cough, no diaphoresis, no dizziness, no fever, no headaches, no nausea, no numbness, no palpitations, no shortness of breath, no vomiting and no weakness. She has tried nothing for the symptoms.  Her past medical history is significant for hypertension.    Past Medical History:  Diagnosis Date  . Arthritis   . Depression   . GERD (gastroesophageal  reflux disease)   . Hypertension   . Thyroid disease   . Tobacco abuse     Patient Active Problem List   Diagnosis Date Noted  . Cigarette smoker 08/29/2015  . Obesity 08/29/2015  . Dyspnea 08/28/2015  . ROTATOR CUFF SYNDROME, LEFT 07/28/2010  . ABDOMINAL PAIN 02/26/2010  . FOLLICULITIS [id number]  . SHOULDER PAIN, LEFT 07/06/2009  . DERMATOPHYTOSIS OF NAIL 12/23/2008  . ACUTE BRONCHITIS 12/23/2008  . UPPER RESPIRATORY INFECTION 12/08/2008  . COSTOCHONDRITIS 12/08/2008  . NEVUS 10/08/2008  . LUMBAGO 09/01/2008  . HYPERTENSION, BENIGN ESSENTIAL 04/23/2008  . HEADACHE 04/23/2008  . LEG PAIN, LEFT 12/25/2007  . CLAUDICATION, INTERMITTENT 11/20/2007  . HYPOTHYROIDISM 04/21/2007  . DEPRESSION 04/21/2007  . ALLERGIC RHINITIS 04/21/2007  . POSTMENOPAUSAL STATUS 04/21/2007    Past Surgical History:  Procedure Laterality Date  . CESAREAN SECTION    . EYE SURGERY      OB History    Gravida Para Term Preterm AB Living   2 1 1   1 1    SAB TAB Ectopic Multiple Live Births   1       1       Home Medications    Prior to Admission medications   Medication Sig Start Date End Date Taking? Authorizing Provider  amitriptyline (ELAVIL) 10 MG tablet Take 1 tablet (10 mg total) by mouth at bedtime. 08/31/15   Alda Berthold, DO  famotidine (PEPCID) 20 MG tablet One at bedtime 08/28/15   Tanda Rockers, MD  Fluticasone-Salmeterol (ADVAIR) 250-50 MCG/DOSE AEPB Inhale 1 puff into  the lungs 2 (two) times daily.    Historical Provider, MD  gabapentin (NEURONTIN) 300 MG capsule Take 300 mg by mouth 3 (three) times daily.    Historical Provider, MD  HYDROcodone-acetaminophen (NORCO/VICODIN) 5-325 MG tablet Take 1 tablet by mouth every 6 (six) hours as needed for moderate pain. 01/26/16   Dalia Heading, PA-C  ibuprofen (ADVIL,MOTRIN) 800 MG tablet Take 1 tablet (800 mg total) by mouth every 8 (eight) hours as needed. Patient taking differently: Take 800 mg by mouth every 8 (eight) hours as  needed for fever, headache, mild pain, moderate pain or cramping.  01/26/15   Dalia Heading, PA-C  irbesartan (AVAPRO) 75 MG tablet TAKE ONE TABLET BY MOUTH ONCE DAILY 12/30/15   Tanda Rockers, MD  levothyroxine (SYNTHROID, LEVOTHROID) 125 MCG tablet Take 125 mcg by mouth daily before breakfast.    Historical Provider, MD  nitrofurantoin, macrocrystal-monohydrate, (MACROBID) 100 MG capsule Take 1 capsule (100 mg total) by mouth 2 (two) times daily. 01/26/16   Dalia Heading, PA-C  PREVIDENT 5000 SENSITIVE 1.1-5 % PSTE  09/14/15   Historical Provider, MD    Family History Family History  Problem Relation Age of Onset  . Breast cancer Mother   . Esophageal cancer Brother 68  . Esophageal cancer Father   . Throat cancer Brother   . Healthy Daughter   . Colon cancer Neg Hx     Social History Social History  Substance Use Topics  . Smoking status: Current Every Day Smoker    Packs/day: 0.50    Years: 40.00    Types: Cigarettes  . Smokeless tobacco: Never Used  . Alcohol use 0.0 oz/week     Comment: occational     Allergies   Patient has no known allergies.   Review of Systems Review of Systems  Constitutional: Negative for activity change, chills, diaphoresis, fatigue and fever.  HENT: Negative for congestion and rhinorrhea.   Eyes: Negative for visual disturbance.  Respiratory: Negative for cough, chest tightness, shortness of breath and stridor.   Cardiovascular: Positive for chest pain. Negative for palpitations and leg swelling.  Gastrointestinal: Negative for abdominal distention, abdominal pain, constipation, diarrhea, nausea and vomiting.  Genitourinary: Negative for difficulty urinating, dysuria, flank pain, frequency, hematuria, menstrual problem, pelvic pain, vaginal bleeding and vaginal discharge.  Musculoskeletal: Positive for back pain. Negative for neck pain.  Skin: Negative for rash and wound.  Neurological: Negative for dizziness, weakness,  light-headedness, numbness and headaches.  Psychiatric/Behavioral: Negative for agitation and confusion.  All other systems reviewed and are negative.    Physical Exam Updated Vital Signs BP 155/88 (BP Location: Left Arm)   Pulse 61   Temp 97.8 F (36.6 C) (Oral)   Resp 17   Ht 5\' 7"  (1.702 m)   Wt 183 lb (83 kg)   SpO2 100%   BMI 28.66 kg/m   Physical Exam  Constitutional: She appears well-developed and well-nourished. No distress.  HENT:  Head: Normocephalic and atraumatic.  Mouth/Throat: Oropharynx is clear and moist. No oropharyngeal exudate.  Eyes: Conjunctivae and EOM are normal. Pupils are equal, round, and reactive to light.  Neck: Normal range of motion. Neck supple.  Cardiovascular: Normal rate and regular rhythm.   No murmur heard. Pulmonary/Chest: Effort normal and breath sounds normal. No stridor. No respiratory distress. She has no wheezes.     She exhibits tenderness.    Abdominal: Soft. There is no tenderness.  Musculoskeletal: She exhibits tenderness. She exhibits no edema.  Neurological: She is  alert.  Skin: Skin is warm and dry.  Psychiatric: She has a normal mood and affect.  Nursing note and vitals reviewed.    ED Treatments / Results  Labs (all labs ordered are listed, but only abnormal results are displayed) Labs Reviewed  BASIC METABOLIC PANEL - Abnormal; Notable for the following:       Result Value   Glucose, Bld 114 (*)    All other components within normal limits  CBC - Abnormal; Notable for the following:    MCV 104.9 (*)    MCH 35.1 (*)    All other components within normal limits  TSH - Abnormal; Notable for the following:    TSH 0.335 (*)    All other components within normal limits  D-DIMER, QUANTITATIVE (NOT AT [facility])  Randolm Idol, ED  I-STAT TROPOININ, ED    EKG  EKG Interpretation  Date/Time:  Monday November 07 2016 07:08:50 EST Ventricular Rate:  59 PR Interval:  190 QRS Duration: 94 QT  Interval:  424 QTC Calculation: 419 R Axis:   89 Text Interpretation:  Sinus bradycardia with sinus arrhythmia Otherwise normal ECG No significant change from prior tracing.  No STEMI Confirmed by Sherry Ruffing MD, [place] [phone]) on 11/07/2016 7:40:15 AM       Radiology Dg Chest 2 View  Result Date: 11/07/2016 CLINICAL DATA:  Chest pain which began at 2 a.m. today. History of hypertension, thyroid disease, current smoker. EXAM: CHEST  2 VIEW COMPARISON:  Chest x-ray dated August 28, 2015 FINDINGS: The lungs are adequately inflated. There is no focal infiltrate. There is no pleural effusion, pneumothorax, or pneumomediastinum. The heart and pulmonary vascularity are normal. The mediastinum is normal in width. There is mild multilevel degenerative disc disease of the thoracic spine. IMPRESSION: There is no active cardiopulmonary disease. Electronically Signed   By: David  Martinique M.D.   On: 11/07/2016 07:50    Procedures Procedures (including critical care time)  Medications Ordered in ED Medications  aspirin chewable tablet 324 mg (324 mg Oral Given 11/07/16 0850)  HYDROcodone-acetaminophen (NORCO/VICODIN) 5-325 MG per tablet 1 tablet (1 tablet Oral Given 11/07/16 1107)  cyclobenzaprine (FLEXERIL) tablet 5 mg (5 mg Oral Given 11/07/16 1107)     Initial Impression / Assessment and Plan / ED Course  I have reviewed the triage vital signs and the nursing notes.  Pertinent labs & imaging results that were available during my care of the patient were reviewed by me and considered in my medical decision making (see chart for details).  Clinical Course     [name] is a 65 y.o. female with a past medical history significant for depression, GERD, hypertension, thyroid disease, and tobacco abuse who presents with chest pain.   History and exam are seen above.  On exam, patient has right sided back tenderness and right chest tenderness. Patient has palpable muscle spasm. Lungs  are clear. Patient has no focal neurologic deficits on exam. Abdomen nontender. Pulses symmetric and bilateral upper extremities. No lower extremity tenderness or edema appreciated.  EKG was performed and appeared similar to prior with no signs of acute ischemia.  Suspect musculoskeletal pain given the palpable muscle spasm, pleurisy, and chest tenderness. Patient will have workup to look for cardiac etiology as well as pulmonary etiology. Patient will have x-ray and lab testing. Patient given aspirin.  Heart Score is a 3.   Diagnostic laboratory testing revealed negative troponin times two, unremarkable BNP, no leukocytosis and no anemia. D dimer negative. TSH  abnormal however, patient reports she has stopped taking her thyroid medicine for some time. Patient instructed to follow up with PCP for further thyroid management. X-ray showed no cardiopulmonary disease.   Given patient's heart score of three and negative troponin, feel patient is stable for discharge. Patient reported some improvement with pain medicine and muscle relaxant. Patient will be a prescription for pain medicine and muscle relaxant.  Patient understood return precautions and plans for follow up with PCP. Patient had no other questions or concerns and patient was discharged in good condition with improvement in presenting pain.   Final Clinical Impressions(s) / ED Diagnoses   Final diagnoses:  Chest pain, unspecified type    New Prescriptions Discharge Medication List as of 11/07/2016 11:39 AM    START taking these medications   Details  cyclobenzaprine (FLEXERIL) 10 MG tablet Take 1 tablet (10 mg total) by mouth 2 (two) times daily as needed for muscle spasms., Starting Mon 11/07/2016, Print    oxyCODONE-acetaminophen (PERCOCET/ROXICET) 5-325 MG tablet Take 1 tablet by mouth every 6 (six) hours as needed for severe pain., Starting Mon 11/07/2016, Print       Clinical Impression: 1. Chest pain, unspecified type      Disposition: Discharge  Condition: Good  I have discussed the results, Dx and Tx plan with the pt(& family if present). He/she/they expressed understanding and agree(s) with the plan. Discharge instructions discussed at great length. Strict return precautions discussed and pt &/or family have verbalized understanding of the instructions. No further questions at time of discharge.    Discharge Medication List as of 11/07/2016 11:39 AM    START taking these medications   Details  cyclobenzaprine (FLEXERIL) 10 MG tablet Take 1 tablet (10 mg total) by mouth 2 (two) times daily as needed for muscle spasms., Starting Mon 11/07/2016, Print    oxyCODONE-acetaminophen (PERCOCET/ROXICET) 5-325 MG tablet Take 1 tablet by mouth every 6 (six) hours as needed for severe pain., Starting Mon 11/07/2016, Print        Follow Up: Rogers Blocker, MD [address] [place] [address] [phone]  Schedule an appointment as soon as possible for a visit    [place] [address] [id number] [place]is [phone]  If symptoms worsen     Courtney Paris, MD 11/07/16 2116

## 2017-07-24 ENCOUNTER — Emergency Department (HOSPITAL_COMMUNITY)
Admission: EM | Admit: 2017-07-24 | Discharge: 2017-07-24 | Disposition: A | Discharge status: Home / Self Care | Payer: Medicare Other | Attending: Emergency Medicine | Admitting: Emergency Medicine

## 2017-07-24 ENCOUNTER — Inpatient Hospital Stay (HOSPITAL_COMMUNITY)
Admission: AD | Admit: 2017-07-24 | Discharge: 2017-07-24 | Discharge status: Against Medical Advice | Payer: Medicare Other | Source: Ambulatory Visit | Attending: Obstetrics & Gynecology | Admitting: Obstetrics & Gynecology

## 2017-07-24 DIAGNOSIS — R51 Headache: Secondary | ICD-10-CM | POA: Diagnosis present

## 2017-07-24 DIAGNOSIS — Z5321 Procedure and treatment not carried out due to patient leaving prior to being seen by health care provider: Secondary | ICD-10-CM | POA: Insufficient documentation

## 2017-07-24 DIAGNOSIS — R109 Unspecified abdominal pain: Secondary | ICD-10-CM | POA: Insufficient documentation

## 2017-07-24 LAB — CBC
HCT: 39.7 % (ref 36.0–46.0)
HEMOGLOBIN: 13.6 g/dL (ref 12.0–15.0)
MCH: 34.5 pg — AB (ref 26.0–34.0)
MCHC: 34.3 g/dL (ref 30.0–36.0)
MCV: 100.8 fL — AB (ref 78.0–100.0)
PLATELETS: 152 10*3/uL (ref 150–400)
RBC: 3.94 MIL/uL (ref 3.87–5.11)
RDW: 12.7 % (ref 11.5–15.5)
WBC: 10.4 10*3/uL (ref 4.0–10.5)

## 2017-07-24 LAB — COMPREHENSIVE METABOLIC PANEL
ALBUMIN: 4 g/dL (ref 3.5–5.0)
ALK PHOS: 72 U/L (ref 38–126)
ALT: 12 U/L — AB (ref 14–54)
AST: 22 U/L (ref 15–41)
Anion gap: 9 (ref 5–15)
BUN: 19 mg/dL (ref 6–20)
CALCIUM: 9.8 mg/dL (ref 8.9–10.3)
CHLORIDE: 108 mmol/L (ref 101–111)
CO2: 25 mmol/L (ref 22–32)
CREATININE: 1.06 mg/dL — AB (ref 0.44–1.00)
GFR calc non Af Amer: 54 mL/min — ABNORMAL LOW (ref >60)
GLUCOSE: 114 mg/dL — AB (ref 65–99)
Potassium: 3.4 mmol/L — ABNORMAL LOW (ref 3.5–5.1)
SODIUM: 142 mmol/L (ref 135–145)
Total Bilirubin: 0.7 mg/dL (ref 0.3–1.2)
Total Protein: 7.4 g/dL (ref 6.5–8.1)

## 2017-07-24 LAB — URINALYSIS, ROUTINE W REFLEX MICROSCOPIC
BILIRUBIN URINE: NEGATIVE
Bilirubin Urine: NEGATIVE
GLUCOSE, UA: NEGATIVE mg/dL
Glucose, UA: NEGATIVE mg/dL
HGB URINE DIPSTICK: NEGATIVE
Hgb urine dipstick: NEGATIVE
KETONES UR: NEGATIVE mg/dL
Ketones, ur: NEGATIVE mg/dL
LEUKOCYTES UA: NEGATIVE
Leukocytes, UA: NEGATIVE
NITRITE: NEGATIVE
Nitrite: NEGATIVE
PROTEIN: NEGATIVE mg/dL
Protein, ur: NEGATIVE mg/dL
SPECIFIC GRAVITY, URINE: 1.017 (ref 1.005–1.030)
SPECIFIC GRAVITY, URINE: 1.019 (ref 1.005–1.030)
pH: 5 (ref 5.0–8.0)
pH: 5 (ref 5.0–8.0)

## 2017-07-24 NOTE — MAU Note (Signed)
Pt reports she has had lower abd cramping for the last 3 weeks. Also reports constipation (last BM was 2 days ago). States she has had a headache for 3 days.

## 2017-07-24 NOTE — ED Triage Notes (Signed)
Pt presents with c/o headache and abdominal pain. Her headache began about 2 days ago. Her headache is a constant pain in the top and back of head. She tried naproxen and ibuprofen with no relief. Her abdominal pain began several weeks ago.  Her pain a generalized cramping pain. She reports acid reflux, constipation. She denies vomiting, diarrhea, urinary symptoms. She was started on omeprazole after an endoscopy with no improvement in her symptoms.

## 2017-07-24 NOTE — ED Notes (Signed)
Pt came to tech 1st roodly complaining of wait. Pt stated she is not waiting any longer and walked out on the ED.

## 2017-07-24 NOTE — MAU Note (Signed)
Pt states she does not want to continue to wait, unable to stay.  AMA form signed.

## 2017-10-04 ENCOUNTER — Ambulatory Visit (HOSPITAL_COMMUNITY)
Admission: EM | Admit: 2017-10-04 | Discharge: 2017-10-04 | Disposition: A | Discharge status: Home / Self Care | Payer: Medicare Other | Attending: Family Medicine | Admitting: Family Medicine

## 2017-10-04 ENCOUNTER — Encounter (HOSPITAL_COMMUNITY): Payer: Self-pay | Admitting: Family Medicine

## 2017-10-04 DIAGNOSIS — K5900 Constipation, unspecified: Secondary | ICD-10-CM

## 2017-10-04 MED ORDER — POLYETHYLENE GLYCOL 3350 17 G PO PACK: 17.0000 g | PACK | Freq: Every day | ORAL | 0 refills | Status: DC

## 2017-10-04 NOTE — ED Triage Notes (Signed)
Pt here for chronic constipation and having to use either a laxative or fleets to have a BM.

## 2017-10-05 NOTE — ED Provider Notes (Signed)
  [place]   [id number] 10/04/17 Arrival Time: 7253  ASSESSMENT & PLAN:  1. Constipation, unspecified constipation type     Meds ordered this encounter  Medications  . polyethylene glycol (MIRALAX / GLYCOLAX) packet    Sig: Take 17 g by mouth daily.    Dispense:  14 each    Refill:  0   Ensure adequate fluid intake. Will f/u with PCP if not improving within the next few days. Reviewed expectations re: course of current medical issues. Questions answered. Outlined signs and symptoms indicating need for more acute intervention. Patient verbalized understanding. After Visit Summary given.   SUBJECTIVE:  [name] is a 66 y.o. female who presents with complaint of constipation for the past few weeks. H/O similar in the past. PRN laxative and enema to have a BM. No bleeding with BM. No abdominal pain, "just a little uncomfortable." Normal PO intake without n/v. Ambulatory without problem.  ROS: As per HPI.   OBJECTIVE:  Vitals:   10/04/17 1500  BP: 120/83  Pulse: 83  Resp: 18  Temp: 98 F (36.7 C)  SpO2: 97%    General appearance: alert; no distress Eyes: PERRLA; EOMI; conjunctiva normal HENT: normocephalic; atraumatic; TMs normal; nasal mucosa normal; oral mucosa normal Neck: supple Lungs: clear to auscultation bilaterally Heart: regular rate and rhythm Abdomen: soft, non-tender; bowel sounds normal; no masses or organomegaly; no guarding or rebound tenderness Back: no CVA tenderness Extremities: no cyanosis or edema; symmetrical with no gross deformities Skin: warm and dry Neurologic: normal gait; normal symmetric reflexes Psychological: alert and cooperative; normal mood and affect  No Known Allergies  Past Medical History:  Diagnosis Date  . Arthritis   . Depression   . GERD (gastroesophageal reflux disease)   . Hypertension   . Thyroid disease   . Tobacco abuse    Social History   Social History  . Marital status: Widowed   Spouse name: N/A  . Number of children: N/A  . Years of education: N/A   Occupational History  . retired     Social History Main Topics  . Smoking status: Current Every Day Smoker    Packs/day: 0.50    Years: 40.00    Types: Cigarettes  . Smokeless tobacco: Never Used  . Alcohol use 0.0 oz/week     Comment: occational  . Drug use: Yes    Types: Marijuana     Comment: occational  . Sexual activity: Yes    Birth control/ protection: Post-menopausal   Other Topics Concern  . Not on file   Social History Narrative   Lives alone in a one story home.  Has one daughter.     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   Family History  Problem Relation Age of Onset  . Breast cancer Mother   . Esophageal cancer Brother 39  . Esophageal cancer Father   . Throat cancer Brother   . Healthy Daughter   . Colon cancer Neg Hx    Past Surgical History:  Procedure Laterality Date  . CESAREAN SECTION    . EYE SURGERY       Vanessa Kick, MD 10/05/17 1004

## 2018-01-09 ENCOUNTER — Ambulatory Visit (HOSPITAL_COMMUNITY)
Admission: EM | Admit: 2018-01-09 | Discharge: 2018-01-09 | Disposition: A | Discharge status: Home / Self Care | Payer: Medicare Other | Attending: Family Medicine | Admitting: Family Medicine

## 2018-01-09 ENCOUNTER — Ambulatory Visit (INDEPENDENT_AMBULATORY_CARE_PROVIDER_SITE_OTHER): Payer: Medicare Other

## 2018-01-09 ENCOUNTER — Encounter (HOSPITAL_COMMUNITY): Payer: Self-pay | Admitting: Emergency Medicine

## 2018-01-09 ENCOUNTER — Other Ambulatory Visit: Payer: Self-pay

## 2018-01-09 DIAGNOSIS — R05 Cough: Secondary | ICD-10-CM

## 2018-01-09 DIAGNOSIS — R509 Fever, unspecified: Secondary | ICD-10-CM

## 2018-01-09 DIAGNOSIS — R69 Illness, unspecified: Secondary | ICD-10-CM

## 2018-01-09 DIAGNOSIS — R059 Cough, unspecified: Secondary | ICD-10-CM

## 2018-01-09 DIAGNOSIS — J111 Influenza due to unidentified influenza virus with other respiratory manifestations: Secondary | ICD-10-CM

## 2018-01-09 MED ORDER — HYDROCODONE-HOMATROPINE 5-1.5 MG/5ML PO SYRP: 5.0000 mL | ORAL_SOLUTION | Freq: Four times a day (QID) | ORAL | 0 refills | Status: DC | PRN

## 2018-01-09 NOTE — ED Triage Notes (Addendum)
Pt reports body aches since Friday.  Pt has been taking Excedrin, and NyQuil Day and Night.

## 2018-01-09 NOTE — Discharge Instructions (Addendum)

## 2018-01-09 NOTE — ED Provider Notes (Signed)
[name]   [id number] 01/09/18 Arrival Time: 4270  ASSESSMENT & PLAN:  1. Influenza-like illness   2. Cough   3. Fever and chills     Meds ordered this encounter  Medications  . HYDROcodone-homatropine (HYCODAN) 5-1.5 MG/5ML syrup    Sig: Take 5 mLs by mouth every 6 (six) hours as needed for cough.    Dispense:  90 mL    Refill:  0   Cough medication sedation precautions. Discussed typical duration of symptoms. OTC symptom care as needed. Ensure adequate fluid intake and rest. May f/u with PCP or here as needed.  Reviewed expectations re: course of current medical issues. Questions answered. Outlined signs and symptoms indicating need for more acute intervention. Patient verbalized understanding. After Visit Summary given.   SUBJECTIVE: History from: patient.  [name] is a 67 y.o. female who presents with complaint of nasal congestion, post-nasal drainage, and a persistent dry cough. Onset abrupt, approximately 4 days ago. Overall fatigued with body aches. SOB: questions some with coughing spells. Wheezing: none. Fever: yes, subjective with chills. Overall normal PO intake without n/v. Sick contacts: no. OTC treatment: cough medication without relief.  Received flu shot this year: no.  Social History   Tobacco Use  Smoking Status Current Every Day Smoker  . Packs/day: 0.50  . Years: 40.00  . Pack years: 20.00  . Types: Cigarettes  Smokeless Tobacco Never Used    ROS: As per HPI.   OBJECTIVE:  Vitals:   01/09/18 1140  BP: 111/84  Pulse: (!) 107  Temp: 100.3 F (37.9 C)  SpO2: 99%     General appearance: alert; appears fatigued HEENT: nasal congestion; clear runny nose; throat irritation secondary to post-nasal drainage Neck: supple without LAD Lungs: unlabored respirations, symmetrical air entry; cough: moderate; no respiratory distress Skin: warm and dry Psychological: alert and cooperative; normal mood and  affect  Imaging: Dg Chest 2 View  Result Date: 01/09/2018 CLINICAL DATA:  Cough and fever. EXAM: CHEST  2 VIEW COMPARISON:  11/07/2016 FINDINGS: The heart size and mediastinal contours are within normal limits. Slight peribronchial thickening with slight accentuation of the interstitial markings at the bases. No consolidative infiltrates or effusions. The visualized skeletal structures are unremarkable. IMPRESSION: Mild bronchitic changes. Electronically Signed   By: Lorriane Shire M.D.   On: 01/09/2018 12:27    No Known Allergies  Past Medical History:  Diagnosis Date  . Arthritis   . Depression   . GERD (gastroesophageal reflux disease)   . Hypertension   . Thyroid disease   . Tobacco abuse    Family History  Problem Relation Age of Onset  . Breast cancer Mother   . Esophageal cancer Brother 60  . Esophageal cancer Father   . Throat cancer Brother   . Healthy Daughter   . Colon cancer Neg Hx    Social History   Socioeconomic History  . Marital status: Widowed    Spouse name: Not on file  . Number of children: Not on file  . Years of education: Not on file  . Highest education level: Not on file  Social Needs  . Financial resource strain: Not on file  . Food insecurity - worry: Not on file  . Food insecurity - inability: Not on file  . Transportation needs - medical: Not on file  . Transportation needs - non-medical: Not on file  Occupational History  . Occupation: retired   Tobacco Use  . Smoking status: Current Every Day  Smoker    Packs/day: 0.50    Years: 40.00    Pack years: 20.00    Types: Cigarettes  . Smokeless tobacco: Never Used  Substance and Sexual Activity  . Alcohol use: Yes    Alcohol/week: 0.0 oz    Comment: occational  . Drug use: Yes    Types: Marijuana    Comment: occational  . Sexual activity: Yes    Birth control/protection: Post-menopausal  Other Topics Concern  . Not on file  Social History Narrative   Lives alone in a one story  home.  Has one daughter.     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           [name] Kick, MD 01/09/18 1250

## 2018-04-13 ENCOUNTER — Other Ambulatory Visit: Payer: Self-pay | Admitting: Nurse Practitioner

## 2018-04-13 DIAGNOSIS — Z1231 Encounter for screening mammogram for malignant neoplasm of breast: Secondary | ICD-10-CM

## 2018-04-19 ENCOUNTER — Ambulatory Visit
Admission: RE | Admit: 2018-04-19 | Discharge: 2018-04-19 | Disposition: A | Discharge status: Home / Self Care | Payer: Medicare Other | Source: Ambulatory Visit | Attending: Nurse Practitioner | Admitting: Nurse Practitioner

## 2018-04-19 DIAGNOSIS — Z1231 Encounter for screening mammogram for malignant neoplasm of breast: Secondary | ICD-10-CM

## 2018-04-20 ENCOUNTER — Other Ambulatory Visit: Payer: Self-pay | Admitting: Nurse Practitioner

## 2018-04-20 DIAGNOSIS — R928 Other abnormal and inconclusive findings on diagnostic imaging of breast: Secondary | ICD-10-CM

## 2018-04-24 ENCOUNTER — Ambulatory Visit
Admission: RE | Admit: 2018-04-24 | Discharge: 2018-04-24 | Disposition: A | Discharge status: Home / Self Care | Payer: Medicare Other | Source: Ambulatory Visit | Attending: Nurse Practitioner | Admitting: Nurse Practitioner

## 2018-04-24 DIAGNOSIS — R928 Other abnormal and inconclusive findings on diagnostic imaging of breast: Secondary | ICD-10-CM

## 2018-06-04 ENCOUNTER — Other Ambulatory Visit (HOSPITAL_COMMUNITY): Payer: Self-pay | Admitting: Family Medicine

## 2018-06-04 DIAGNOSIS — R1011 Right upper quadrant pain: Secondary | ICD-10-CM | POA: Diagnosis not present

## 2018-06-04 DIAGNOSIS — K5900 Constipation, unspecified: Secondary | ICD-10-CM | POA: Diagnosis not present

## 2018-06-06 ENCOUNTER — Ambulatory Visit (HOSPITAL_COMMUNITY)
Admission: RE | Admit: 2018-06-06 | Discharge: 2018-06-06 | Disposition: A | Discharge status: Home / Self Care | Payer: Medicare Other | Source: Ambulatory Visit | Attending: Family Medicine | Admitting: Family Medicine

## 2018-06-06 DIAGNOSIS — R1011 Right upper quadrant pain: Secondary | ICD-10-CM | POA: Diagnosis not present

## 2018-07-16 DIAGNOSIS — R1013 Epigastric pain: Secondary | ICD-10-CM | POA: Diagnosis not present

## 2018-07-19 DIAGNOSIS — K2930 Chronic superficial gastritis without bleeding: Secondary | ICD-10-CM | POA: Diagnosis not present

## 2018-07-19 DIAGNOSIS — R1013 Epigastric pain: Secondary | ICD-10-CM | POA: Diagnosis not present

## 2018-07-20 DIAGNOSIS — H2513 Age-related nuclear cataract, bilateral: Secondary | ICD-10-CM | POA: Diagnosis not present

## 2018-07-20 DIAGNOSIS — H2512 Age-related nuclear cataract, left eye: Secondary | ICD-10-CM | POA: Diagnosis not present

## 2018-07-20 DIAGNOSIS — Z961 Presence of intraocular lens: Secondary | ICD-10-CM | POA: Diagnosis not present

## 2018-07-20 DIAGNOSIS — H2511 Age-related nuclear cataract, right eye: Secondary | ICD-10-CM | POA: Diagnosis not present

## 2018-07-20 DIAGNOSIS — H31012 Macula scars of posterior pole (postinflammatory) (post-traumatic), left eye: Secondary | ICD-10-CM | POA: Diagnosis not present

## 2018-07-20 DIAGNOSIS — Z01818 Encounter for other preprocedural examination: Secondary | ICD-10-CM | POA: Diagnosis not present

## 2018-07-20 DIAGNOSIS — H25811 Combined forms of age-related cataract, right eye: Secondary | ICD-10-CM | POA: Diagnosis not present

## 2018-07-20 DIAGNOSIS — H35371 Puckering of macula, right eye: Secondary | ICD-10-CM | POA: Diagnosis not present

## 2018-07-25 DIAGNOSIS — K2930 Chronic superficial gastritis without bleeding: Secondary | ICD-10-CM | POA: Diagnosis not present

## 2018-08-31 DIAGNOSIS — Z0000 Encounter for general adult medical examination without abnormal findings: Secondary | ICD-10-CM | POA: Diagnosis not present

## 2018-09-19 DIAGNOSIS — E079 Disorder of thyroid, unspecified: Secondary | ICD-10-CM | POA: Diagnosis not present

## 2018-09-19 DIAGNOSIS — I10 Essential (primary) hypertension: Secondary | ICD-10-CM | POA: Diagnosis not present

## 2018-09-19 DIAGNOSIS — E785 Hyperlipidemia, unspecified: Secondary | ICD-10-CM | POA: Diagnosis not present

## 2018-09-19 DIAGNOSIS — R197 Diarrhea, unspecified: Secondary | ICD-10-CM | POA: Diagnosis not present

## 2018-10-04 DIAGNOSIS — H2512 Age-related nuclear cataract, left eye: Secondary | ICD-10-CM | POA: Diagnosis not present

## 2018-10-04 DIAGNOSIS — H25812 Combined forms of age-related cataract, left eye: Secondary | ICD-10-CM | POA: Diagnosis not present

## 2018-11-11 DIAGNOSIS — H40033 Anatomical narrow angle, bilateral: Secondary | ICD-10-CM | POA: Diagnosis not present

## 2018-11-11 DIAGNOSIS — H2513 Age-related nuclear cataract, bilateral: Secondary | ICD-10-CM | POA: Diagnosis not present

## 2018-11-19 DIAGNOSIS — E079 Disorder of thyroid, unspecified: Secondary | ICD-10-CM | POA: Diagnosis not present

## 2018-11-26 DIAGNOSIS — H43393 Other vitreous opacities, bilateral: Secondary | ICD-10-CM | POA: Diagnosis not present

## 2018-11-29 DIAGNOSIS — H5213 Myopia, bilateral: Secondary | ICD-10-CM | POA: Diagnosis not present

## 2018-12-02 ENCOUNTER — Encounter: Payer: Self-pay | Admitting: Gastroenterology

## 2019-04-22 DIAGNOSIS — J309 Allergic rhinitis, unspecified: Secondary | ICD-10-CM | POA: Diagnosis not present

## 2019-04-22 DIAGNOSIS — I10 Essential (primary) hypertension: Secondary | ICD-10-CM | POA: Diagnosis not present

## 2019-04-22 DIAGNOSIS — E039 Hypothyroidism, unspecified: Secondary | ICD-10-CM | POA: Diagnosis not present

## 2019-04-22 DIAGNOSIS — K219 Gastro-esophageal reflux disease without esophagitis: Secondary | ICD-10-CM | POA: Diagnosis not present

## 2019-07-17 DIAGNOSIS — E785 Hyperlipidemia, unspecified: Secondary | ICD-10-CM | POA: Diagnosis not present

## 2019-07-17 DIAGNOSIS — E039 Hypothyroidism, unspecified: Secondary | ICD-10-CM | POA: Diagnosis not present

## 2019-07-17 DIAGNOSIS — I10 Essential (primary) hypertension: Secondary | ICD-10-CM | POA: Diagnosis not present

## 2019-07-17 DIAGNOSIS — R300 Dysuria: Secondary | ICD-10-CM | POA: Diagnosis not present

## 2019-11-21 ENCOUNTER — Other Ambulatory Visit: Payer: Self-pay

## 2019-11-21 NOTE — Patient Outreach (Signed)
[facility]) Care Management  11/21/2019  [name] 06/04/1951 [id number]   Medication Adherence call to [name] [facility] Identifiers Verify spoke with patient she is past due on Pravastatin 40 mg,patient explain she is taking 1 tablet every evening,patient explain she has plenty at this time about 3 more month,she explain at some point she end up with extras, [name] is showing past due under [facility] [place].  [place] Management Direct Dial [phone]  Fax [phone] [email or web address]

## 2019-12-26 DIAGNOSIS — M47819 Spondylosis without myelopathy or radiculopathy, site unspecified: Secondary | ICD-10-CM | POA: Diagnosis not present

## 2019-12-26 DIAGNOSIS — I10 Essential (primary) hypertension: Secondary | ICD-10-CM | POA: Diagnosis not present

## 2019-12-26 DIAGNOSIS — E039 Hypothyroidism, unspecified: Secondary | ICD-10-CM | POA: Diagnosis not present

## 2020-05-05 ENCOUNTER — Other Ambulatory Visit (HOSPITAL_COMMUNITY): Payer: Self-pay

## 2020-05-06 ENCOUNTER — Inpatient Hospital Stay (HOSPITAL_COMMUNITY)
Admission: RE | Admit: 2020-05-06 | Discharge: 2020-05-06 | Disposition: A | Discharge status: Home / Self Care | Payer: Medicare Other | Source: Ambulatory Visit | Attending: Family Medicine | Admitting: Family Medicine

## 2020-05-13 ENCOUNTER — Encounter (HOSPITAL_COMMUNITY): Payer: 59

## 2020-10-16 ENCOUNTER — Other Ambulatory Visit: Payer: Self-pay | Admitting: General Practice

## 2020-10-16 DIAGNOSIS — F17210 Nicotine dependence, cigarettes, uncomplicated: Secondary | ICD-10-CM

## 2020-11-24 ENCOUNTER — Ambulatory Visit
Admission: EM | Admit: 2020-11-24 | Discharge: 2020-11-24 | Disposition: A | Discharge status: Home / Self Care | Payer: Medicare Other | Attending: Emergency Medicine | Admitting: Emergency Medicine

## 2020-11-24 DIAGNOSIS — R059 Cough, unspecified: Secondary | ICD-10-CM

## 2020-11-24 DIAGNOSIS — R519 Headache, unspecified: Secondary | ICD-10-CM

## 2020-11-24 DIAGNOSIS — Z20822 Contact with and (suspected) exposure to covid-19: Secondary | ICD-10-CM | POA: Diagnosis not present

## 2020-11-24 DIAGNOSIS — Z1152 Encounter for screening for COVID-19: Secondary | ICD-10-CM

## 2020-11-24 DIAGNOSIS — R6889 Other general symptoms and signs: Secondary | ICD-10-CM

## 2020-11-24 MED ORDER — FLUTICASONE PROPIONATE 50 MCG/ACT NA SUSP: 1.0000 | Freq: Every day | NASAL | 0 refills | Status: AC

## 2020-11-24 MED ORDER — ONDANSETRON 4 MG PO TBDP
4.0000 mg | ORAL_TABLET | Freq: Once | ORAL | Status: AC
  Administered 2020-11-24: 4 mg via ORAL

## 2020-11-24 MED ORDER — DEXAMETHASONE SODIUM PHOSPHATE 10 MG/ML IJ SOLN
10.0000 mg | Freq: Once | INTRAMUSCULAR | Status: AC
  Administered 2020-11-24: 10 mg via INTRAMUSCULAR

## 2020-11-24 MED ORDER — KETOROLAC TROMETHAMINE 30 MG/ML IJ SOLN
30.0000 mg | Freq: Once | INTRAMUSCULAR | Status: AC
  Administered 2020-11-24: 30 mg via INTRAMUSCULAR

## 2020-11-24 MED ORDER — ACETAMINOPHEN 325 MG PO TABS
975.0000 mg | ORAL_TABLET | Freq: Once | ORAL | Status: AC
  Administered 2020-11-24: 975 mg via ORAL

## 2020-11-24 MED ORDER — CETIRIZINE HCL 10 MG PO TABS: 10.0000 mg | ORAL_TABLET | Freq: Every day | ORAL | 0 refills | Status: AC

## 2020-11-24 NOTE — ED Provider Notes (Signed)
[name]    CSN: [id number] Arrival date & time: 11/24/20  1551      History   Chief Complaint Chief Complaint  Patient presents with  . Fever    HPI [name] is a 69 y.o. female  With history as below presenting for dry cough, fever, aches and chills, headache.  States headache is most bothersome as she does not have history thereof.  Unknown T-max.  Has not taken anything for symptoms.  No known sick contacts.  Did not get Covid vaccine.  Denies difficulty breathing or chest pain.  Past Medical History:  Diagnosis Date  . Arthritis   . Depression   . GERD (gastroesophageal reflux disease)   . Hypertension   . Thyroid disease   . Tobacco abuse     Patient Active Problem List   Diagnosis Date Noted  . Cigarette smoker 08/29/2015  . Obesity 08/29/2015  . Dyspnea 08/28/2015  . ROTATOR CUFF SYNDROME, LEFT 07/28/2010  . ABDOMINAL PAIN 02/26/2010  . FOLLICULITIS 63/14/9702  . SHOULDER PAIN, LEFT 07/06/2009  . DERMATOPHYTOSIS OF NAIL 12/23/2008  . ACUTE BRONCHITIS 12/23/2008  . UPPER RESPIRATORY INFECTION 12/08/2008  . COSTOCHONDRITIS 12/08/2008  . NEVUS 10/08/2008  . LUMBAGO 09/01/2008  . HYPERTENSION, BENIGN ESSENTIAL 04/23/2008  . HEADACHE 04/23/2008  . LEG PAIN, LEFT 12/25/2007  . CLAUDICATION, INTERMITTENT 11/20/2007  . HYPOTHYROIDISM 04/21/2007  . DEPRESSION 04/21/2007  . ALLERGIC RHINITIS 04/21/2007  . POSTMENOPAUSAL STATUS 04/21/2007    Past Surgical History:  Procedure Laterality Date  . CESAREAN SECTION    . EYE SURGERY      OB History    Gravida  2   Para  1   Term  1   Preterm      AB  1   Living  1     SAB  1   IAB      Ectopic      Multiple      Live Births  1            Home Medications    Prior to Admission medications   Medication Sig Start Date End Date Taking? Authorizing Provider  cetirizine (ZYRTEC ALLERGY) 10 MG tablet Take 1 tablet (10 mg total) by mouth daily. 11/24/20    Hall-Potvin, [place], PA-C  Cyanocobalamin (VITAMIN B 12 PO) Take 1 tablet by mouth daily.    [provider]  famotidine (PEPCID) 20 MG tablet One at bedtime 08/28/15   Tanda Rockers, MD  fluticasone [facility]) 50 MCG/ACT nasal spray Place 1 spray into both nostrils daily. 11/24/20   Hall-Potvin, [place], PA-C  Fluticasone-Salmeterol (ADVAIR) 250-50 MCG/DOSE AEPB Inhale 1 puff into the lungs 2 (two) times daily.    [provider]  gabapentin (NEURONTIN) 300 MG capsule Take 300 mg by mouth 3 (three) times daily.    [provider]  hydrochlorothiazide (MICROZIDE) 12.5 MG capsule Take 12.5 mg by mouth daily.    [provider]  ibuprofen (ADVIL,MOTRIN) 800 MG tablet Take 1 tablet (800 mg total) by mouth every 8 (eight) hours as needed. Patient taking differently: Take 800 mg by mouth every 8 (eight) hours as needed for fever, headache, mild pain, moderate pain or cramping.  01/26/15   Lawyer, Harrell Gave, PA-C  irbesartan (AVAPRO) 75 MG tablet TAKE ONE TABLET BY MOUTH ONCE DAILY 12/30/15   Tanda Rockers, MD  levothyroxine (SYNTHROID, LEVOTHROID) 125 MCG tablet Take 125 mcg by mouth daily before breakfast.    [provider]    Family History Family History  Problem Relation Age of Onset  . Breast cancer Mother 78  . Esophageal cancer Brother 47  . Esophageal cancer Father   . Throat cancer Brother   . Healthy Daughter   . Colon cancer Neg Hx     Social History Social History   Tobacco Use  . Smoking status: Current Every Day Smoker    Packs/day: 0.50    Years: 40.00    Pack years: 20.00    Types: Cigarettes  . Smokeless tobacco: Never Used  Substance Use Topics  . Alcohol use: Yes    Alcohol/week: 0.0 standard drinks    Comment: occational  . Drug use: Yes    Types: Marijuana    Comment: occational     Allergies   Patient has no known allergies.   Review of Systems Review of Systems  Constitutional: Negative for fatigue  and fever.  HENT: Positive for congestion. Negative for dental problem, ear pain, facial swelling, hearing loss, sinus pain, sore throat, trouble swallowing and voice change.   Eyes: Negative for photophobia, pain and visual disturbance.  Respiratory: Positive for cough. Negative for shortness of breath.   Cardiovascular: Negative for chest pain and palpitations.  Gastrointestinal: Negative for diarrhea and vomiting.  Musculoskeletal: Positive for myalgias. Negative for arthralgias.  Neurological: Positive for headaches. Negative for dizziness, tremors, facial asymmetry, speech difficulty, weakness, light-headedness and numbness.     Physical Exam Triage Vital Signs ED Triage Vitals [11/24/20 1755]  Enc Vitals Group     BP 119/84     Pulse Rate 95     Resp 18     Temp (!) 101.7 F (38.7 C)     Temp Source Oral     SpO2 92 %     Weight      Height      Head Circumference      Peak Flow      Pain Score 8     Pain Loc      Pain Edu?      Excl. in [place]?    No data found.  Updated Vital Signs BP 119/84 (BP Location: Left Arm)   Pulse 95   Temp (!) 101.7 F (38.7 C) (Oral)   Resp 18   SpO2 92%   Visual Acuity Right Eye Distance:   Left Eye Distance:   Bilateral Distance:    Right Eye Near:   Left Eye Near:    Bilateral Near:     Physical Exam Constitutional:      General: She is not in acute distress.    Appearance: She is not ill-appearing or diaphoretic.  HENT:     Head: Normocephalic and atraumatic.     Right Ear: Tympanic membrane, ear canal and external ear normal.     Left Ear: Tympanic membrane, ear canal and external ear normal.     Mouth/Throat:     Mouth: Mucous membranes are moist.     Pharynx: Oropharynx is clear. No oropharyngeal exudate or posterior oropharyngeal erythema.  Eyes:     General: No scleral icterus.    Extraocular Movements: Extraocular movements intact.     Conjunctiva/sclera: Conjunctivae normal.     Pupils: Pupils are equal,  round, and reactive to light.  Neck:     Comments: Trachea midline, negative JVD Cardiovascular:     Rate and Rhythm: Normal rate and regular rhythm.     Heart sounds: No murmur heard. No gallop.  Pulmonary:     Effort: Pulmonary effort is normal. No respiratory distress.     Breath sounds: No wheezing, rhonchi or rales.  Musculoskeletal:        General: No deformity. Normal range of motion.     Cervical back: Normal range of motion and neck supple. No rigidity or tenderness.  Lymphadenopathy:     Cervical: No cervical adenopathy.  Skin:    Capillary Refill: Capillary refill takes less than 2 seconds.     Coloration: Skin is not jaundiced or pale.     Findings: No bruising or rash.  Neurological:     General: No focal deficit present.     Mental Status: She is alert and oriented to person, place, and time.     Cranial Nerves: Cranial nerves are intact.     Sensory: Sensation is intact.     Motor: Motor function is intact.     Coordination: Coordination is intact.     Gait: Gait is intact.  Psychiatric:        Mood and Affect: Mood normal.        Behavior: Behavior normal.      UC Treatments / Results  Labs (all labs ordered are listed, but only abnormal results are displayed) Labs Reviewed  NOVEL CORONAVIRUS, NAA    EKG   Radiology No results found.  Procedures Procedures (including critical care time)  Medications Ordered in UC Medications  ketorolac (TORADOL) 30 MG/ML injection 30 mg (has no administration in time range)  dexamethasone (DECADRON) injection 10 mg (has no administration in time range)  ondansetron (ZOFRAN-ODT) disintegrating tablet 4 mg (has no administration in time range)  acetaminophen (TYLENOL) tablet 975 mg (975 mg Oral Given 11/24/20 1759)    Initial Impression / Assessment and Plan / UC Course  I have reviewed the triage vital signs and the nursing notes.  Pertinent labs & imaging results that were available during my care of the  patient were reviewed by me and considered in my medical decision making (see chart for details).     Patient febrile, nontoxic, with SpO2 92%.  No neurocognitive deficit in office today-headache cocktail given which she tolerated well.  Covid PCR pending.  Patient to quarantine until results are back.  We will treat supportively as outlined below.  Return precautions discussed, patient verbalized understanding and is agreeable to plan. Final Clinical Impressions(s) / UC Diagnoses   Final diagnoses:  Encounter for screening laboratory testing for COVID-19 virus  Flu-like symptoms  Cough  Bad headache     Discharge Instructions     You were given medications today for your headache. Important to keep a log of your headaches: When they start, what alleviates them, pain on a scale of 1-10. Bring your headache log to your primary care for further evaluation as you may need to be on medications to help prevent headaches. Go to ER for worse headache of life, loss/change of vision, vomiting, fever, ear ringing, dizziness, weakness, facial droop/slurred speech, severe abdominal pain.    ED Prescriptions    Medication Sig Dispense Auth. Provider   cetirizine (ZYRTEC ALLERGY) 10 MG tablet Take 1 tablet (10 mg total) by mouth daily. 30 tablet Hall-Potvin, [place], PA-C   fluticasone (FLONASE) 50 MCG/ACT nasal spray Place 1 spray into both nostrils daily. 16 g Hall-Potvin, [place], PA-C     PDMP not reviewed this encounter.   Hall-Potvin, [place], [place] 11/24/20 1822

## 2020-11-24 NOTE — ED Triage Notes (Signed)
Pt c/o cough, fever, body aches, chills, and slight headache x3 days

## 2020-11-24 NOTE — Discharge Instructions (Signed)
You were given medications today for your headache. Important to keep a log of your headaches: When they start, what alleviates them, pain on a scale of 1-10. Bring your headache log to your primary care for further evaluation as you may need to be on medications to help prevent headaches. Go to ER for worse headache of life, loss/change of vision, vomiting, fever, ear ringing, dizziness, weakness, facial droop/slurred speech, severe abdominal pain.

## 2020-11-26 ENCOUNTER — Other Ambulatory Visit: Payer: Self-pay

## 2020-11-26 ENCOUNTER — Emergency Department (HOSPITAL_COMMUNITY): Payer: Medicare Other

## 2020-11-26 ENCOUNTER — Emergency Department (HOSPITAL_COMMUNITY)
Admission: EM | Admit: 2020-11-26 | Discharge: 2020-11-26 | Disposition: A | Discharge status: Home / Self Care | Payer: Medicare Other | Source: Home / Self Care | Attending: Emergency Medicine | Admitting: Emergency Medicine

## 2020-11-26 DIAGNOSIS — F17210 Nicotine dependence, cigarettes, uncomplicated: Secondary | ICD-10-CM | POA: Insufficient documentation

## 2020-11-26 DIAGNOSIS — Z79899 Other long term (current) drug therapy: Secondary | ICD-10-CM | POA: Insufficient documentation

## 2020-11-26 DIAGNOSIS — U071 COVID-19: Secondary | ICD-10-CM | POA: Insufficient documentation

## 2020-11-26 DIAGNOSIS — Z20822 Contact with and (suspected) exposure to covid-19: Secondary | ICD-10-CM

## 2020-11-26 DIAGNOSIS — E039 Hypothyroidism, unspecified: Secondary | ICD-10-CM | POA: Insufficient documentation

## 2020-11-26 DIAGNOSIS — I10 Essential (primary) hypertension: Secondary | ICD-10-CM | POA: Insufficient documentation

## 2020-11-26 LAB — BASIC METABOLIC PANEL
Anion gap: 10 (ref 5–15)
BUN: 23 mg/dL (ref 8–23)
CO2: 22 mmol/L (ref 22–32)
Calcium: 9.1 mg/dL (ref 8.9–10.3)
Chloride: 105 mmol/L (ref 98–111)
Creatinine, Ser: 1.09 mg/dL — ABNORMAL HIGH (ref 0.44–1.00)
GFR, Estimated: 55 mL/min — ABNORMAL LOW (ref >60)
Glucose, Bld: 95 mg/dL (ref 70–99)
Potassium: 3.9 mmol/L (ref 3.5–5.1)
Sodium: 137 mmol/L (ref 135–145)

## 2020-11-26 LAB — CBC
HCT: 36.6 % (ref 36.0–46.0)
Hemoglobin: 12.3 g/dL (ref 12.0–15.0)
MCH: 36.4 pg — ABNORMAL HIGH (ref 26.0–34.0)
MCHC: 33.6 g/dL (ref 30.0–36.0)
MCV: 108.3 fL — ABNORMAL HIGH (ref 80.0–100.0)
Platelets: 128 10*3/uL — ABNORMAL LOW (ref 150–400)
RBC: 3.38 MIL/uL — ABNORMAL LOW (ref 3.87–5.11)
RDW: 12.3 % (ref 11.5–15.5)
WBC: 6.9 10*3/uL (ref 4.0–10.5)
nRBC: 0 % (ref 0.0–0.2)

## 2020-11-26 LAB — URINALYSIS, ROUTINE W REFLEX MICROSCOPIC
Bilirubin Urine: NEGATIVE
Glucose, UA: NEGATIVE mg/dL
Hgb urine dipstick: NEGATIVE
Ketones, ur: NEGATIVE mg/dL
Leukocytes,Ua: NEGATIVE
Nitrite: NEGATIVE
Protein, ur: 30 mg/dL — AB
Specific Gravity, Urine: 1.025 (ref 1.005–1.030)
pH: 5 (ref 5.0–8.0)

## 2020-11-26 LAB — RESP PANEL BY RT-PCR (FLU A&B, COVID) ARPGX2
Influenza A by PCR: NEGATIVE
Influenza B by PCR: NEGATIVE
SARS Coronavirus 2 by RT PCR: POSITIVE — AB

## 2020-11-26 MED ORDER — ACETAMINOPHEN 500 MG PO TABS
1000.0000 mg | ORAL_TABLET | Freq: Once | ORAL | Status: AC
  Administered 2020-11-26: 19:00:00 1000 mg via ORAL
  Filled 2020-11-26: qty 2

## 2020-11-26 NOTE — ED Notes (Signed)
Pt able to ambulate without assistance on room air. Patient became short of breath but was able to maintain SpO2 above 94%

## 2020-11-26 NOTE — ED Triage Notes (Signed)
Patient reports to the ER for COVID symptoms. Patient reports she feels weak and tired. Patient reports she is unvaccinated against covid.

## 2020-11-26 NOTE — ED Provider Notes (Signed)
[facility] DEPT Provider Note   CSN: [id number] Arrival date & time: 11/26/20  1605     History Chief Complaint  Patient presents with  . Covid Positive    [name] is a 69 y.o. female.  The history is provided by the patient and medical records. No language interpreter was used.     69 year old female significant of hypertension, tobacco abuse, thyroid disease, presents ED with Covid symptoms.  Patient report for the past 5 days she has had fever, chills, body aches, dry cough, headache, decrease in appetite, and overall not feeling well.  She tries taking Tylenol ibuprofen at home without adequate relief.  She was seen 2 days ago in urgent care and had a Covid test done.  She was told that she test positive for COVID-19.  She denies any recent sick contact.  She has not been vaccinated for Covid infection.  She does endorse shortness of breath and recurrent cough.  She admits to tobacco use but states for the past few days she have not been smoking.  Past Medical History:  Diagnosis Date  . Arthritis   . Depression   . GERD (gastroesophageal reflux disease)   . Hypertension   . Thyroid disease   . Tobacco abuse     Patient Active Problem List   Diagnosis Date Noted  . Cigarette smoker 08/29/2015  . Obesity 08/29/2015  . Dyspnea 08/28/2015  . ROTATOR CUFF SYNDROME, LEFT 07/28/2010  . ABDOMINAL PAIN 02/26/2010  . FOLLICULITIS 50/38/8828  . SHOULDER PAIN, LEFT 07/06/2009  . DERMATOPHYTOSIS OF NAIL 12/23/2008  . ACUTE BRONCHITIS 12/23/2008  . UPPER RESPIRATORY INFECTION 12/08/2008  . COSTOCHONDRITIS 12/08/2008  . NEVUS 10/08/2008  . LUMBAGO 09/01/2008  . HYPERTENSION, BENIGN ESSENTIAL 04/23/2008  . HEADACHE 04/23/2008  . LEG PAIN, LEFT 12/25/2007  . CLAUDICATION, INTERMITTENT 11/20/2007  . HYPOTHYROIDISM 04/21/2007  . DEPRESSION 04/21/2007  . ALLERGIC RHINITIS 04/21/2007  . POSTMENOPAUSAL STATUS 04/21/2007    Past  Surgical History:  Procedure Laterality Date  . CESAREAN SECTION    . EYE SURGERY       OB History    Gravida  2   Para  1   Term  1   Preterm      AB  1   Living  1     SAB  1   IAB      Ectopic      Multiple      Live Births  1           Family History  Problem Relation Age of Onset  . Breast cancer Mother 70  . Esophageal cancer Brother 15  . Esophageal cancer Father   . Throat cancer Brother   . Healthy Daughter   . Colon cancer Neg Hx     Social History   Tobacco Use  . Smoking status: Current Every Day Smoker    Packs/day: 0.50    Years: 40.00    Pack years: 20.00    Types: Cigarettes  . Smokeless tobacco: Never Used  Substance Use Topics  . Alcohol use: Yes    Alcohol/week: 0.0 standard drinks    Comment: occational  . Drug use: Yes    Types: Marijuana    Comment: occational    Home Medications Prior to Admission medications   Medication Sig Start Date End Date Taking? Authorizing Provider  cetirizine (ZYRTEC ALLERGY) 10 MG tablet Take 1 tablet (10 mg total) by mouth daily. 11/24/20  Hall-Potvin, [place], PA-C  Cyanocobalamin (VITAMIN B 12 PO) Take 1 tablet by mouth daily.    [provider]  famotidine (PEPCID) 20 MG tablet One at bedtime 08/28/15   Tanda Rockers, MD  fluticasone [facility]) 50 MCG/ACT nasal spray Place 1 spray into both nostrils daily. 11/24/20   Hall-Potvin, [place], PA-C  Fluticasone-Salmeterol (ADVAIR) 250-50 MCG/DOSE AEPB Inhale 1 puff into the lungs 2 (two) times daily.    [provider]  gabapentin (NEURONTIN) 300 MG capsule Take 300 mg by mouth 3 (three) times daily.    [provider]  hydrochlorothiazide (MICROZIDE) 12.5 MG capsule Take 12.5 mg by mouth daily.    [provider]  ibuprofen (ADVIL,MOTRIN) 800 MG tablet Take 1 tablet (800 mg total) by mouth every 8 (eight) hours as needed. Patient taking differently: Take 800 mg by mouth every 8 (eight) hours as needed for  fever, headache, mild pain, moderate pain or cramping.  01/26/15   Lawyer, Harrell Gave, PA-C  irbesartan (AVAPRO) 75 MG tablet TAKE ONE TABLET BY MOUTH ONCE DAILY 12/30/15   Tanda Rockers, MD  levothyroxine (SYNTHROID, LEVOTHROID) 125 MCG tablet Take 125 mcg by mouth daily before breakfast.    [provider]    Allergies    Patient has no known allergies.  Review of Systems   Review of Systems  All other systems reviewed and are negative.   Physical Exam Updated Vital Signs BP 122/87   Pulse (!) 101   Temp (!) 100.7 F (38.2 C) (Oral)   Resp 17   SpO2 96%   Physical Exam Vitals and nursing note reviewed.  Constitutional:      General: She is not in acute distress.    Appearance: She is well-developed and well-nourished. She is obese.  HENT:     Head: Atraumatic.  Eyes:     Conjunctiva/sclera: Conjunctivae normal.  Cardiovascular:     Rate and Rhythm: Normal rate and regular rhythm.     Pulses: Normal pulses.     Heart sounds: Normal heart sounds.  Pulmonary:     Effort: Pulmonary effort is normal.     Breath sounds: Normal breath sounds. No wheezing, rhonchi or rales.  Abdominal:     Palpations: Abdomen is soft.     Tenderness: There is no abdominal tenderness.  Musculoskeletal:     Cervical back: Neck supple.  Skin:    Findings: No rash.  Neurological:     Mental Status: She is alert. Mental status is at baseline.  Psychiatric:        Mood and Affect: Mood and affect and mood normal.     ED Results / Procedures / Treatments   Labs (all labs ordered are listed, but only abnormal results are displayed) Labs Reviewed  BASIC METABOLIC PANEL - Abnormal; Notable for the following components:      Result Value   Creatinine, Ser 1.09 (*)    GFR, Estimated 55 (*)    All other components within normal limits  CBC - Abnormal; Notable for the following components:   RBC 3.38 (*)    MCV 108.3 (*)    MCH 36.4 (*)    Platelets 128 (*)    All other  components within normal limits  URINALYSIS, ROUTINE W REFLEX MICROSCOPIC    EKG None  Radiology DG Chest Portable 1 View  Result Date: 11/26/2020 CLINICAL DATA:  69 year old female with shortness of breath. Positive COVID-19. EXAM: PORTABLE CHEST 1 VIEW COMPARISON:  Chest radiograph dated  01/09/2018. FINDINGS: Mild diffuse interstitial coarsening and chronic bronchitic changes. Faint bibasilar densities, likely atelectasis. Atypical infection is less likely. No focal consolidation, pleural effusion, pneumothorax. There is mild cardiomegaly. Atherosclerotic calcification of the aortic arch. No acute osseous pathology. IMPRESSION: 1. No acute cardiopulmonary process. 2. Mild cardiomegaly. Electronically Signed   By: Anner Crete M.D.   On: 11/26/2020 19:07    Procedures Procedures (including critical care time)  Medications Ordered in ED Medications  acetaminophen (TYLENOL) tablet 1,000 mg (1,000 mg Oral Given 11/26/20 1901)    ED Course  I have reviewed the triage vital signs and the nursing notes.  Pertinent labs & imaging results that were available during my care of the patient were reviewed by me and considered in my medical decision making (see chart for details).    MDM Rules/Calculators/A&P                          BP 104/70   Pulse 75   Temp 98.8 F (37.1 C) (Oral)   Resp 18   SpO2 93%   Final Clinical Impression(s) / ED Diagnoses Final diagnoses:  Suspected COVID-19 virus infection    Rx / DC Orders ED Discharge Orders    None     7:47 PM Patient here with report of Covid symptoms for the past 5 days.  Was seen 2 days ago for her symptom and was told today that she test positive for COVID-19.  At this time she is mildly febrile with a temperature of 100.7, Tylenol given.  Patient otherwise nontoxic in appearance, chest x-ray unremarkable, ambulating around the room was satting at 94% on room air.  She does meet criteria for monoclonal antibody infusion  due to body mass index, history of tobacco use, and history of hypertension.  She does not meet criteria for admission.  Care discussed with Dr. Eulis Foster  9:30 PM I have not seen a positive Covid test in her record.  We obtain a Covid test today and it is currently pending.  Patient does not want to stay any longer to wait for the result.  I gave patient number to our monoclonal antibody infusion clinic to follow-up if she test positive for infection.  Return precaution discussed.  Patient otherwise stable for discharge.  [name] was evaluated in Emergency Department on 11/26/2020 for the symptoms described in the history of present illness. She was evaluated in the context of the global COVID-19 pandemic, which necessitated consideration that the patient might be at risk for infection with the SARS-CoV-2 virus that causes COVID-19. Institutional protocols and algorithms that pertain to the evaluation of patients at risk for COVID-19 are in a state of rapid change based on information released by regulatory bodies including the CDC and federal and state organizations. These policies and algorithms were followed during the patient's care in the ED.    Domenic Moras, PA-C 11/26/20 2131    Daleen Bo, MD 11/27/20 [phone]

## 2020-11-26 NOTE — Discharge Instructions (Signed)
Please check MyChart, link below in the next 24 hours to find your Covid result.  If you test positive, please follow instruction below.  Return if you have worsening shortness of breath, or if you have other concern.  Recommendations for at home COVID-19 symptoms management:  Please continue isolation at home. Call [phone] to see whether you might be eligible for therapeutic antibody infusions (leave your name and they will call you back).  If have acute worsening of symptoms please go to ER/urgent care for further evaluation. Check pulse oximetry and if below 90-92% please go to ER. The following supplements MAY help:  Vitamin C 500mg  twice a day and Quercetin 250-500 mg twice a day Vitamin D3 2000 - 4000 u/day B Complex vitamins Zinc 75-100 mg/day Melatonin 6-10 mg at night (the optimal dose is unknown) Aspirin 81mg /day (if no history of bleeding issues)

## 2020-11-27 ENCOUNTER — Telehealth: Payer: Self-pay | Admitting: Physician Assistant

## 2020-11-27 LAB — SARS-COV-2, NAA 2 DAY TAT

## 2020-11-27 LAB — NOVEL CORONAVIRUS, NAA: SARS-CoV-2, NAA: DETECTED — AB

## 2020-11-27 NOTE — Telephone Encounter (Signed)
Called to Discuss with patient about Covid symptoms and the use of the monoclonal antibody infusion for those with mild to moderate Covid symptoms and at a high risk of hospitalization.     Pt appears to qualify for this infusion due to co-morbid conditions and/or a member of an at-risk group in accordance with the FDA Emergency Use Authorization.    Unable to reach pt. Voice mail is full.

## 2020-11-29 ENCOUNTER — Inpatient Hospital Stay (HOSPITAL_COMMUNITY)
Admission: EM | Admit: 2020-11-29 | Discharge: 2021-01-12 | DRG: 207 | Disposition: E | Payer: Medicare Other | Attending: Pulmonary Disease | Admitting: Pulmonary Disease

## 2020-11-29 ENCOUNTER — Encounter (HOSPITAL_COMMUNITY): Payer: Self-pay | Admitting: Emergency Medicine

## 2020-11-29 ENCOUNTER — Emergency Department (HOSPITAL_COMMUNITY): Payer: Medicare Other

## 2020-11-29 ENCOUNTER — Other Ambulatory Visit: Payer: Self-pay

## 2020-11-29 DIAGNOSIS — J9690 Respiratory failure, unspecified, unspecified whether with hypoxia or hypercapnia: Secondary | ICD-10-CM

## 2020-11-29 DIAGNOSIS — F10231 Alcohol dependence with withdrawal delirium: Secondary | ICD-10-CM | POA: Diagnosis not present

## 2020-11-29 DIAGNOSIS — I69351 Hemiplegia and hemiparesis following cerebral infarction affecting right dominant side: Secondary | ICD-10-CM | POA: Diagnosis not present

## 2020-11-29 DIAGNOSIS — J9600 Acute respiratory failure, unspecified whether with hypoxia or hypercapnia: Secondary | ICD-10-CM

## 2020-11-29 DIAGNOSIS — I2699 Other pulmonary embolism without acute cor pulmonale: Secondary | ICD-10-CM | POA: Diagnosis not present

## 2020-11-29 DIAGNOSIS — Z66 Do not resuscitate: Secondary | ICD-10-CM | POA: Diagnosis not present

## 2020-11-29 DIAGNOSIS — D696 Thrombocytopenia, unspecified: Secondary | ICD-10-CM | POA: Diagnosis not present

## 2020-11-29 DIAGNOSIS — J80 Acute respiratory distress syndrome: Secondary | ICD-10-CM

## 2020-11-29 DIAGNOSIS — N179 Acute kidney failure, unspecified: Secondary | ICD-10-CM | POA: Diagnosis not present

## 2020-11-29 DIAGNOSIS — T4275XA Adverse effect of unspecified antiepileptic and sedative-hypnotic drugs, initial encounter: Secondary | ICD-10-CM | POA: Diagnosis not present

## 2020-11-29 DIAGNOSIS — I10 Essential (primary) hypertension: Secondary | ICD-10-CM | POA: Diagnosis present

## 2020-11-29 DIAGNOSIS — Z515 Encounter for palliative care: Secondary | ICD-10-CM | POA: Diagnosis not present

## 2020-11-29 DIAGNOSIS — E162 Hypoglycemia, unspecified: Secondary | ICD-10-CM | POA: Diagnosis not present

## 2020-11-29 DIAGNOSIS — J1282 Pneumonia due to coronavirus disease 2019: Secondary | ICD-10-CM | POA: Diagnosis present

## 2020-11-29 DIAGNOSIS — Z9911 Dependence on respirator [ventilator] status: Secondary | ICD-10-CM

## 2020-11-29 DIAGNOSIS — Z800 Family history of malignant neoplasm of digestive organs: Secondary | ICD-10-CM

## 2020-11-29 DIAGNOSIS — R578 Other shock: Secondary | ICD-10-CM | POA: Diagnosis not present

## 2020-11-29 DIAGNOSIS — E872 Acidosis: Secondary | ICD-10-CM | POA: Diagnosis present

## 2020-11-29 DIAGNOSIS — E870 Hyperosmolality and hypernatremia: Secondary | ICD-10-CM | POA: Diagnosis not present

## 2020-11-29 DIAGNOSIS — R4182 Altered mental status, unspecified: Secondary | ICD-10-CM | POA: Diagnosis not present

## 2020-11-29 DIAGNOSIS — K219 Gastro-esophageal reflux disease without esophagitis: Secondary | ICD-10-CM | POA: Diagnosis present

## 2020-11-29 DIAGNOSIS — F17210 Nicotine dependence, cigarettes, uncomplicated: Secondary | ICD-10-CM | POA: Diagnosis present

## 2020-11-29 DIAGNOSIS — Z808 Family history of malignant neoplasm of other organs or systems: Secondary | ICD-10-CM

## 2020-11-29 DIAGNOSIS — E875 Hyperkalemia: Secondary | ICD-10-CM | POA: Diagnosis not present

## 2020-11-29 DIAGNOSIS — R239 Unspecified skin changes: Secondary | ICD-10-CM

## 2020-11-29 DIAGNOSIS — T380X5A Adverse effect of glucocorticoids and synthetic analogues, initial encounter: Secondary | ICD-10-CM | POA: Diagnosis not present

## 2020-11-29 DIAGNOSIS — U071 COVID-19: Secondary | ICD-10-CM | POA: Diagnosis not present

## 2020-11-29 DIAGNOSIS — J9601 Acute respiratory failure with hypoxia: Secondary | ICD-10-CM

## 2020-11-29 DIAGNOSIS — R7989 Other specified abnormal findings of blood chemistry: Secondary | ICD-10-CM | POA: Diagnosis not present

## 2020-11-29 DIAGNOSIS — A419 Sepsis, unspecified organism: Secondary | ICD-10-CM | POA: Diagnosis not present

## 2020-11-29 DIAGNOSIS — E039 Hypothyroidism, unspecified: Secondary | ICD-10-CM | POA: Diagnosis present

## 2020-11-29 DIAGNOSIS — Z803 Family history of malignant neoplasm of breast: Secondary | ICD-10-CM

## 2020-11-29 DIAGNOSIS — Z79899 Other long term (current) drug therapy: Secondary | ICD-10-CM

## 2020-11-29 DIAGNOSIS — R159 Full incontinence of feces: Secondary | ICD-10-CM | POA: Diagnosis present

## 2020-11-29 DIAGNOSIS — J982 Interstitial emphysema: Secondary | ICD-10-CM | POA: Diagnosis not present

## 2020-11-29 DIAGNOSIS — S27309A Unspecified injury of lung, unspecified, initial encounter: Secondary | ICD-10-CM

## 2020-11-29 DIAGNOSIS — E440 Moderate protein-calorie malnutrition: Secondary | ICD-10-CM | POA: Diagnosis not present

## 2020-11-29 DIAGNOSIS — E8770 Fluid overload, unspecified: Secondary | ICD-10-CM | POA: Diagnosis not present

## 2020-11-29 DIAGNOSIS — A523 Neurosyphilis, unspecified: Secondary | ICD-10-CM | POA: Diagnosis not present

## 2020-11-29 DIAGNOSIS — G9341 Metabolic encephalopathy: Secondary | ICD-10-CM | POA: Diagnosis not present

## 2020-11-29 DIAGNOSIS — Z4659 Encounter for fitting and adjustment of other gastrointestinal appliance and device: Secondary | ICD-10-CM

## 2020-11-29 DIAGNOSIS — R7401 Elevation of levels of liver transaminase levels: Secondary | ICD-10-CM | POA: Diagnosis not present

## 2020-11-29 DIAGNOSIS — R0902 Hypoxemia: Secondary | ICD-10-CM

## 2020-11-29 DIAGNOSIS — Z79890 Hormone replacement therapy (postmenopausal): Secondary | ICD-10-CM

## 2020-11-29 DIAGNOSIS — R6521 Severe sepsis with septic shock: Secondary | ICD-10-CM | POA: Diagnosis not present

## 2020-11-29 DIAGNOSIS — F32A Depression, unspecified: Secondary | ICD-10-CM | POA: Diagnosis present

## 2020-11-29 DIAGNOSIS — Z01818 Encounter for other preprocedural examination: Secondary | ICD-10-CM

## 2020-11-29 DIAGNOSIS — Z978 Presence of other specified devices: Secondary | ICD-10-CM

## 2020-11-29 DIAGNOSIS — M1990 Unspecified osteoarthritis, unspecified site: Secondary | ICD-10-CM | POA: Diagnosis present

## 2020-11-29 DIAGNOSIS — R0602 Shortness of breath: Secondary | ICD-10-CM

## 2020-11-29 DIAGNOSIS — T68XXXA Hypothermia, initial encounter: Secondary | ICD-10-CM | POA: Diagnosis not present

## 2020-11-29 DIAGNOSIS — E871 Hypo-osmolality and hyponatremia: Secondary | ICD-10-CM | POA: Diagnosis not present

## 2020-11-29 DIAGNOSIS — R451 Restlessness and agitation: Secondary | ICD-10-CM | POA: Diagnosis not present

## 2020-11-29 DIAGNOSIS — D649 Anemia, unspecified: Secondary | ICD-10-CM | POA: Diagnosis not present

## 2020-11-29 DIAGNOSIS — G9389 Other specified disorders of brain: Secondary | ICD-10-CM | POA: Diagnosis not present

## 2020-11-29 DIAGNOSIS — A539 Syphilis, unspecified: Secondary | ICD-10-CM | POA: Diagnosis not present

## 2020-11-29 DIAGNOSIS — R739 Hyperglycemia, unspecified: Secondary | ICD-10-CM | POA: Diagnosis not present

## 2020-11-29 DIAGNOSIS — N19 Unspecified kidney failure: Secondary | ICD-10-CM

## 2020-11-29 DIAGNOSIS — G629 Polyneuropathy, unspecified: Secondary | ICD-10-CM | POA: Diagnosis present

## 2020-11-29 DIAGNOSIS — G9340 Encephalopathy, unspecified: Secondary | ICD-10-CM | POA: Diagnosis not present

## 2020-11-29 DIAGNOSIS — Z7951 Long term (current) use of inhaled steroids: Secondary | ICD-10-CM

## 2020-11-29 DIAGNOSIS — Z781 Physical restraint status: Secondary | ICD-10-CM

## 2020-11-29 DIAGNOSIS — I952 Hypotension due to drugs: Secondary | ICD-10-CM | POA: Diagnosis not present

## 2020-11-29 LAB — COMPREHENSIVE METABOLIC PANEL
ALT: 29 U/L (ref 0–44)
AST: 132 U/L — ABNORMAL HIGH (ref 15–41)
Albumin: 3.5 g/dL (ref 3.5–5.0)
Alkaline Phosphatase: 102 U/L (ref 38–126)
Anion gap: 12 (ref 5–15)
BUN: 16 mg/dL (ref 8–23)
CO2: 20 mmol/L — ABNORMAL LOW (ref 22–32)
Calcium: 8.6 mg/dL — ABNORMAL LOW (ref 8.9–10.3)
Chloride: 110 mmol/L (ref 98–111)
Creatinine, Ser: 0.9 mg/dL (ref 0.44–1.00)
GFR, Estimated: >60 mL/min (ref >60)
Glucose, Bld: 156 mg/dL — ABNORMAL HIGH (ref 70–99)
Potassium: 3.7 mmol/L (ref 3.5–5.1)
Sodium: 142 mmol/L (ref 135–145)
Total Bilirubin: 0.7 mg/dL (ref 0.3–1.2)
Total Protein: 7.4 g/dL (ref 6.5–8.1)

## 2020-11-29 LAB — CBC WITH DIFFERENTIAL/PLATELET
Abs Immature Granulocytes: 0.06 10*3/uL (ref 0.00–0.07)
Basophils Absolute: 0 10*3/uL (ref 0.0–0.1)
Basophils Relative: 0 %
Eosinophils Absolute: 0 10*3/uL (ref 0.0–0.5)
Eosinophils Relative: 0 %
HCT: 37.6 % (ref 36.0–46.0)
Hemoglobin: 12.8 g/dL (ref 12.0–15.0)
Immature Granulocytes: 1 %
Lymphocytes Relative: 13 %
Lymphs Abs: 1 10*3/uL (ref 0.7–4.0)
MCH: 36.1 pg — ABNORMAL HIGH (ref 26.0–34.0)
MCHC: 34 g/dL (ref 30.0–36.0)
MCV: 105.9 fL — ABNORMAL HIGH (ref 80.0–100.0)
Monocytes Absolute: 0.4 10*3/uL (ref 0.1–1.0)
Monocytes Relative: 5 %
Neutro Abs: 6.1 10*3/uL (ref 1.7–7.7)
Neutrophils Relative %: 81 %
Platelets: 135 10*3/uL — ABNORMAL LOW (ref 150–400)
RBC: 3.55 MIL/uL — ABNORMAL LOW (ref 3.87–5.11)
RDW: 12.6 % (ref 11.5–15.5)
WBC: 7.5 10*3/uL (ref 4.0–10.5)
nRBC: 0.4 % — ABNORMAL HIGH (ref 0.0–0.2)

## 2020-11-29 LAB — PROCALCITONIN: Procalcitonin: 0.1 ng/mL

## 2020-11-29 LAB — LACTIC ACID, PLASMA
Lactic Acid, Venous: 1.8 mmol/L (ref 0.5–1.9)
Lactic Acid, Venous: 1.5 mmol/L (ref 0.5–1.9)

## 2020-11-29 LAB — C-REACTIVE PROTEIN: CRP: 17.8 mg/dL — ABNORMAL HIGH (ref <1.0)

## 2020-11-29 LAB — TRIGLYCERIDES: Triglycerides: 221 mg/dL — ABNORMAL HIGH

## 2020-11-29 LAB — LACTATE DEHYDROGENASE: LDH: 1164 U/L — ABNORMAL HIGH (ref 98–192)

## 2020-11-29 MED ORDER — ACETAMINOPHEN 325 MG PO TABS
650.0000 mg | ORAL_TABLET | Freq: Four times a day (QID) | ORAL | Status: DC | PRN
  Administered 2020-12-04 – 2020-12-08 (×3): 650 mg via ORAL
  Filled 2020-11-29 (×3): qty 2

## 2020-11-29 MED ORDER — SODIUM CHLORIDE 0.9 % IV SOLN: 100.0000 mg | Freq: Every day | INTRAVENOUS | Status: DC

## 2020-11-29 MED ORDER — HYDROCOD POLST-CPM POLST ER 10-8 MG/5ML PO SUER
5.0000 mL | Freq: Two times a day (BID) | ORAL | Status: DC | PRN
  Administered 2020-12-04 (×2): 5 mL via ORAL
  Filled 2020-11-29 (×2): qty 5

## 2020-11-29 MED ORDER — ONDANSETRON HCL 4 MG PO TABS: 4.0000 mg | ORAL_TABLET | Freq: Four times a day (QID) | ORAL | Status: DC | PRN

## 2020-11-29 MED ORDER — SODIUM CHLORIDE 0.9 % IV SOLN
500.0000 mg | INTRAVENOUS | Status: DC
  Administered 2020-11-29 – 2020-12-02 (×4): 500 mg via INTRAVENOUS
  Filled 2020-11-29 (×4): qty 500

## 2020-11-29 MED ORDER — SODIUM CHLORIDE 0.9 % IV SOLN
100.0000 mg | Freq: Every day | INTRAVENOUS | Status: AC
  Administered 2020-11-30 – 2020-12-03 (×4): 100 mg via INTRAVENOUS
  Filled 2020-11-29 (×4): qty 20

## 2020-11-29 MED ORDER — SODIUM CHLORIDE 0.9 % IV SOLN
200.0000 mg | Freq: Once | INTRAVENOUS | Status: AC
  Administered 2020-11-29: 22:00:00 200 mg via INTRAVENOUS
  Filled 2020-11-29: qty 40
  Filled 2020-11-29: qty 200

## 2020-11-29 MED ORDER — DEXAMETHASONE SODIUM PHOSPHATE 10 MG/ML IJ SOLN
10.0000 mg | Freq: Once | INTRAMUSCULAR | Status: AC
  Administered 2020-11-29: 20:00:00 10 mg via INTRAVENOUS
  Filled 2020-11-29: qty 1

## 2020-11-29 MED ORDER — SODIUM CHLORIDE 0.9 % IV SOLN: 200.0000 mg | Freq: Once | INTRAVENOUS | Status: DC

## 2020-11-29 MED ORDER — GUAIFENESIN-DM 100-10 MG/5ML PO SYRP
10.0000 mL | ORAL_SOLUTION | ORAL | Status: DC | PRN
Start: 2020-11-29 — End: 2020-12-06

## 2020-11-29 MED ORDER — ONDANSETRON HCL 4 MG/2ML IJ SOLN: 4.0000 mg | Freq: Four times a day (QID) | INTRAMUSCULAR | Status: DC | PRN

## 2020-11-29 NOTE — ED Provider Notes (Signed)
[facility] DEPT Provider Note   CSN: [id number] Arrival date & time: 12/04/2020  1909     History Chief Complaint  Patient presents with  . Covid Positive  . Emesis  . Fever    [name] is a 69 y.o. female.  HPI   69 year old female history of arthritis, depression, GERD, hypertension, thyroid disease, tobacco abuse, who presents the emergency department today for evaluation of shortness of breath.  Patient was seen in the ED 12/14 and diagnosed with Covid.  She states she has been symptomatic for the last week but has been more short of breath over the last few days.  She has had sweats, chills, body aches, fever, cough and fatigue.  She is also had a few episodes of vomiting and diarrhea.  She denies any chest pain or abdominal pain.  Per EMS she was in the high 80s on room air, she was placed on 2 L and sats increased in the 90s.  She had an elevated heart rate and temp of 103F in route.  She was given a gram of Tylenol, 500 cc normal saline and 4 mg of Zofran.  Past Medical History:  Diagnosis Date  . Arthritis   . Depression   . GERD (gastroesophageal reflux disease)   . Hypertension   . Thyroid disease   . Tobacco abuse     Patient Active Problem List   Diagnosis Date Noted  . Cigarette smoker 08/29/2015  . Obesity 08/29/2015  . Dyspnea 08/28/2015  . ROTATOR CUFF SYNDROME, LEFT 07/28/2010  . ABDOMINAL PAIN 02/26/2010  . FOLLICULITIS 09/47/0962  . SHOULDER PAIN, LEFT 07/06/2009  . DERMATOPHYTOSIS OF NAIL 12/23/2008  . ACUTE BRONCHITIS 12/23/2008  . UPPER RESPIRATORY INFECTION 12/08/2008  . COSTOCHONDRITIS 12/08/2008  . NEVUS 10/08/2008  . LUMBAGO 09/01/2008  . HYPERTENSION, BENIGN ESSENTIAL 04/23/2008  . HEADACHE 04/23/2008  . LEG PAIN, LEFT 12/25/2007  . CLAUDICATION, INTERMITTENT 11/20/2007  . HYPOTHYROIDISM 04/21/2007  . DEPRESSION 04/21/2007  . ALLERGIC RHINITIS 04/21/2007  . POSTMENOPAUSAL STATUS 04/21/2007     Past Surgical History:  Procedure Laterality Date  . CESAREAN SECTION    . EYE SURGERY       OB History    Gravida  2   Para  1   Term  1   Preterm      AB  1   Living  1     SAB  1   IAB      Ectopic      Multiple      Live Births  1           Family History  Problem Relation Age of Onset  . Breast cancer Mother 77  . Esophageal cancer Brother 43  . Esophageal cancer Father   . Throat cancer Brother   . Healthy Daughter   . Colon cancer Neg Hx     Social History   Tobacco Use  . Smoking status: Current Every Day Smoker    Packs/day: 0.50    Years: 40.00    Pack years: 20.00    Types: Cigarettes  . Smokeless tobacco: Never Used  Substance Use Topics  . Alcohol use: Yes    Alcohol/week: 0.0 standard drinks    Comment: occational  . Drug use: Yes    Types: Marijuana    Comment: occational    Home Medications Prior to Admission medications   Medication Sig Start Date End Date Taking? Authorizing Provider  cetirizine (ZYRTEC  ALLERGY) 10 MG tablet Take 1 tablet (10 mg total) by mouth daily. 11/24/20   Hall-Potvin, [place], PA-C  Cyanocobalamin (VITAMIN B 12 PO) Take 1 tablet by mouth daily.    [provider]  famotidine (PEPCID) 20 MG tablet One at bedtime 08/28/15   Tanda Rockers, MD  fluticasone [facility]) 50 MCG/ACT nasal spray Place 1 spray into both nostrils daily. 11/24/20   Hall-Potvin, [place], PA-C  Fluticasone-Salmeterol (ADVAIR) 250-50 MCG/DOSE AEPB Inhale 1 puff into the lungs 2 (two) times daily.    [provider]  gabapentin (NEURONTIN) 300 MG capsule Take 300 mg by mouth 3 (three) times daily.    [provider]  hydrochlorothiazide (MICROZIDE) 12.5 MG capsule Take 12.5 mg by mouth daily.    [provider]  ibuprofen (ADVIL,MOTRIN) 800 MG tablet Take 1 tablet (800 mg total) by mouth every 8 (eight) hours as needed. Patient taking differently: Take 800 mg by mouth every 8 (eight) hours as  needed for fever, headache, mild pain, moderate pain or cramping.  01/26/15   Lawyer, Harrell Gave, PA-C  irbesartan (AVAPRO) 75 MG tablet TAKE ONE TABLET BY MOUTH ONCE DAILY 12/30/15   Tanda Rockers, MD  levothyroxine (SYNTHROID, LEVOTHROID) 125 MCG tablet Take 125 mcg by mouth daily before breakfast.    [provider]    Allergies    Patient has no known allergies.  Review of Systems   Review of Systems  Constitutional: Positive for appetite change, chills, diaphoresis and fever.  HENT: Negative for ear pain and sore throat.   Eyes: Negative for pain and visual disturbance.  Respiratory: Positive for cough and shortness of breath.   Cardiovascular: Negative for chest pain.  Gastrointestinal: Positive for diarrhea, nausea and vomiting. Negative for abdominal pain.  Genitourinary: Negative for dysuria and hematuria.  Musculoskeletal: Positive for myalgias. Negative for back pain.  Skin: Negative for rash.  Neurological: Negative for seizures and syncope.  All other systems reviewed and are negative.   Physical Exam Updated Vital Signs BP 110/69   Pulse (!) 113   Temp (!) 101.3 F (38.5 C) (Oral)   Resp (!) 28   Ht 5\' 7"  (1.702 m)   Wt 82.6 kg   SpO2 (!) 25%   BMI 28.51 kg/m   Physical Exam Vitals and nursing note reviewed.  Constitutional:      General: She is not in acute distress.    Appearance: She is well-developed and well-nourished.  HENT:     Head: Normocephalic and atraumatic.     Mouth/Throat:     Mouth: Mucous membranes are dry.  Eyes:     Conjunctiva/sclera: Conjunctivae normal.  Cardiovascular:     Rate and Rhythm: Regular rhythm. Tachycardia present.     Heart sounds: No murmur heard.   Pulmonary:     Comments: Tachypneic, speaking in short sentences. Diffuse rales bilaterally (R>L) Abdominal:     General: Bowel sounds are normal.     Palpations: Abdomen is soft.     Tenderness: There is no abdominal tenderness. There is no guarding or  rebound.  Musculoskeletal:        General: No edema.     Cervical back: Neck supple.  Skin:    General: Skin is warm and dry.  Neurological:     Mental Status: She is alert.     Comments: Alert and oriented x3, following commands  Psychiatric:        Mood and Affect: Mood and affect normal.  ED Results / Procedures / Treatments   Labs (all labs ordered are listed, but only abnormal results are displayed) Labs Reviewed  CBC WITH DIFFERENTIAL/PLATELET - Abnormal; Notable for the following components:      Result Value   RBC 3.55 (*)    MCV 105.9 (*)    MCH 36.1 (*)    Platelets 135 (*)    nRBC 0.4 (*)    All other components within normal limits  COMPREHENSIVE METABOLIC PANEL - Abnormal; Notable for the following components:   CO2 20 (*)    Glucose, Bld 156 (*)    Calcium 8.6 (*)    AST 132 (*)    All other components within normal limits  LACTATE DEHYDROGENASE - Abnormal; Notable for the following components:   LDH 1,164 (*)    All other components within normal limits  TRIGLYCERIDES - Abnormal; Notable for the following components:   Triglycerides 221 (*)    All other components within normal limits  CULTURE, BLOOD (ROUTINE X 2)  CULTURE, BLOOD (ROUTINE X 2)  LACTIC ACID, PLASMA  LACTIC ACID, PLASMA  PROCALCITONIN  FERRITIN  C-REACTIVE PROTEIN  D-DIMER, QUANTITATIVE (NOT AT [facility])  FIBRINOGEN    EKG EKG Interpretation  Date/Time:  "Sunday November 29 2020 19:34:53 EST Ventricular Rate:  111 PR Interval:    QRS Duration: 88 QT Interval:  299 QTC Calculation: 407 R Axis:   83 Text Interpretation: Sinus tachycardia Ventricular premature complex Right atrial enlargement Borderline right axis deviation Low voltage, precordial leads Nonspecific T abnormalities, inferior leads Since last tracing Rate faster and new PVC and T wave abnormality Otherwise no significant change Confirmed by Wentz, Elliott ([id number]) on 12/06/2020 8:15:16 PM   Radiology DG Chest Port 1  View  Result Date: 11/27/2020 CLINICAL DATA:  Shortness of breath. EXAM: PORTABLE CHEST 1 VIEW COMPARISON:  11/26/2020 FINDINGS: There are progressive hazy airspace opacities bilaterally with prominent interstitial lung markings. There is no pneumothorax. The heart size is stable. There is developing consolidation at the lung bases, left greater than right. IMPRESSION: Progressive hazy bilateral airspace opacities consistent with the patient's history of COVID-19 pneumonia. Electronically Signed   By: Christopher  Green M.D.   On: 11/28/2020 20:36    Procedures Procedures (including critical care time)  Medications Ordered in ED Medications  dexamethasone (DECADRON) injection 10 mg (10 mg Intravenous Given 11/28/2020 1953)    ED Course  I have reviewed the triage vital signs and the nursing notes.  Pertinent labs & imaging results that were available during my care of the patient were reviewed by me and considered in my medical decision making (see chart for details).    MDM Rules/Calculators/A&P                          69"  year old female with known Covid presenting the emergency department today for evaluation of shortness of breath over the last few days.  Diagnosed 1 week ago.  Noted to be hypoxic with EMS in route.  On arrival was called to the bedside emergently as patient dropped to 29% on room air.  She was placed on a nonrebreather and sats increased into the 50s 60s and then later increased into the mid 80s.  She was placed on humidified O2 at 15 L and sats increased to the high 90s.  Reviewed/interpreted labs CBC is without leukocytosis, no anemia. CMP with normal creatinine, elevated AST at 132, normal bilirubin.  No significant electrolyte derangement LDH  significantly elevated Pro calcitonin pending on admission Blood cultures obtained Lactic acid normal Remainder of inflammatory markers pending on admission  EKG - Sinus tachycardia Ventricular premature complex Right  atrial enlargement Borderline right axis deviation Low voltage, precordial leads Nonspecific T abnormalities, inferior leads Since last tracing Rate faster and new PVC and T wave abnormality Otherwise no significant change   CXR reviewed/interpreted -  Progressive hazy bilateral airspace opacities consistent with the patient's history of COVID-19 pneumonia  Pt with acute hypoxemic respiratory failure from COVID. Given decadron and tylenol here in the ED. Will plan for admission.   8:46 PM CONSULT with Dr. Jonelle Sidle who accepts patient for admission   Final Clinical Impression(s) / ED Diagnoses Final diagnoses:  Acute respiratory failure with hypoxia [facility])    Rx / DC Orders ED Discharge Orders    None       Bishop Dublin 11/30/2020 2049    Daleen Bo, MD 11/30/20 1827

## 2020-11-29 NOTE — ED Triage Notes (Signed)
Pt BIB EMS from home. Pt was seen in ED on 12/14 and dx with COVID. Initial sx: chills, body aches, fever, cough, fatigue. Pt reports sympotms worsened and now pt c/o N/V.   Temp 103F 110 HR 30RR O2 High 80s on RA  4 mg zofran 1G tylenol  500cc NS

## 2020-11-29 NOTE — ED Notes (Signed)
[facility] [phone] wants nurse to call when they get a chance.

## 2020-11-29 NOTE — H&P (Signed)
History and Physical   [name] AGT:[id number] DOB: Oct 16, 1951 DOA: 11/12/2020  Referring MD/NP/PA: Dr. Eulis Foster  PCP: Rogers Blocker, MD   Outpatient Specialists: None  Patient coming from: Home  Chief Complaint: Fever vomiting  HPI: [name] is a 69 y.o. female with medical history significant of hypothyroidism, hypertension, GERD, depression, tobacco abuse, osteoarthritis who was diagnosed with COVID-19 5 days ago in the ER.  Patient came back to the ER due to worsening symptoms of vomiting fever and oxygen sat found to be in the 80s today.  Initially was placed on 2 L but still not improved much.  Patient ultimately placed on 15 L with oxygen sats in the 90s.  Temperature was 103 on arrival.  Patient also noted acutely ill and debilitated.  Chest x-ray showed diffuse infiltrates consistent with COVID-19 pneumonia so patient has been admitted to the hospital for further evaluation and treatment.  ED Course: Temperature 101.3, blood pressure 134/100, pulse 113 respiratory of 34 oxygen sats 60% on room air and currently 95% on 15 L.  White count 7.5 hemoglobin 12.0 platelets 135.  Sodium 142 potassium 3.7 chloride 110 CO2 20 creatinine 0.99 calcium 8.6.  LDH 1164 triglyceride 221 CRP 17.8 lactic acid 1.8 procalcitonin 0.1 and lactic acid 1.5.  Chest x-ray showed progressive hazy airspace opacities bilaterally with prominent interstitial lung markings.  Consistent with COVID-19 pneumonia  Review of Systems: As per HPI otherwise 10 point review of systems negative.    Past Medical History:  Diagnosis Date  . Arthritis   . Depression   . GERD (gastroesophageal reflux disease)   . Hypertension   . Thyroid disease   . Tobacco abuse     Past Surgical History:  Procedure Laterality Date  . CESAREAN SECTION    . EYE SURGERY       reports that she has been smoking cigarettes. She has a 20.00 pack-year smoking history. She has never used smokeless tobacco. She reports  current alcohol use. She reports current drug use. Drug: Marijuana.  No Known Allergies  Family History  Problem Relation Age of Onset  . Breast cancer Mother 72  . Esophageal cancer Brother 1  . Esophageal cancer Father   . Throat cancer Brother   . Healthy Daughter   . Colon cancer Neg Hx      Prior to Admission medications   Medication Sig Start Date End Date Taking? Authorizing Provider  cetirizine (ZYRTEC ALLERGY) 10 MG tablet Take 1 tablet (10 mg total) by mouth daily. 11/24/20   Hall-Potvin, [place], PA-C  Cyanocobalamin (VITAMIN B 12 PO) Take 1 tablet by mouth daily.    [provider]  famotidine (PEPCID) 20 MG tablet One at bedtime Patient taking differently: Take 20 mg by mouth at bedtime. 08/28/15   Tanda Rockers, MD  fluticasone (FLONASE) 50 MCG/ACT nasal spray Place 1 spray into both nostrils daily. 11/24/20   Hall-Potvin, [place], PA-C  Fluticasone-Salmeterol (ADVAIR) 250-50 MCG/DOSE AEPB Inhale 1 puff into the lungs 2 (two) times daily.    [provider]  gabapentin (NEURONTIN) 300 MG capsule Take 300 mg by mouth 3 (three) times daily.    [provider]  hydrochlorothiazide (MICROZIDE) 12.5 MG capsule Take 12.5 mg by mouth daily.    [provider]  ibuprofen (ADVIL,MOTRIN) 800 MG tablet Take 1 tablet (800 mg total) by mouth every 8 (eight) hours as needed. Patient taking differently: Take 800 mg by mouth every 8 (eight) hours as needed for  fever, headache, mild pain, moderate pain or cramping.  01/26/15   Lawyer, Harrell Gave, PA-C  irbesartan (AVAPRO) 75 MG tablet TAKE ONE TABLET BY MOUTH ONCE DAILY Patient taking differently: Take 75 mg by mouth daily. 12/30/15   Tanda Rockers, MD  levothyroxine (SYNTHROID, LEVOTHROID) 125 MCG tablet Take 125 mcg by mouth daily before breakfast.    [provider]    Physical Exam: Vitals:   11/27/2020 1945 11/15/2020 2015 12/04/2020 2019 11/20/2020 2045  BP: 117/74 110/69  118/75   Pulse: (!) 104 (!) 113  (!) 108  Resp: (!) 24 (!) 28 (!) 28 (!) 34  Temp:      TempSrc:      SpO2: 97% (!) 25%  95%  Weight:      Height:          Constitutional: Acutely ill looking, no significant distress Vitals:   11/28/2020 1945 12/07/2020 2015 11/24/2020 2019 11/13/2020 2045  BP: 117/74 110/69  118/75  Pulse: (!) 104 (!) 113  (!) 108  Resp: (!) 24 (!) 28 (!) 28 (!) 34  Temp:      TempSrc:      SpO2: 97% (!) 25%  95%  Weight:      Height:       Eyes: PERRL, lids and conjunctivae normal ENMT: Mucous membranes are dry. Posterior pharynx clear of any exudate or lesions.Normal dentition.  Neck: normal, supple, no masses, no thyromegaly Respiratory: Decreased air entry bilaterally with Rhonchi, crackles, and rales.  Increased respiratory drive. No accessory muscle use.  Cardiovascular: Sinus tachycardia no murmurs / rubs / gallops. No extremity edema. 2+ pedal pulses. No carotid bruits.  Abdomen: no tenderness, no masses palpated. No hepatosplenomegaly. Bowel sounds positive.  Musculoskeletal: no clubbing / cyanosis. No joint deformity upper and lower extremities. Good ROM, no contractures. Normal muscle tone.  Skin: no rashes, lesions, ulcers. No induration Neurologic: CN 2-12 grossly intact. Sensation intact, DTR normal. Strength 5/5 in all 4.  Psychiatric:  Alert and oriented x 3.  Anxious mood.     Labs on Admission: I have personally reviewed following labs and imaging studies  CBC: Recent Labs  Lab 11/26/20 1736 11/25/2020 1925  WBC 6.9 7.5  NEUTROABS  --  6.1  HGB 12.3 12.8  HCT 36.6 37.6  MCV 108.3* 105.9*  PLT 128* 569*   Basic Metabolic Panel: Recent Labs  Lab 11/26/20 1736 11/14/2020 1925  NA 137 142  K 3.9 3.7  CL 105 110  CO2 22 20*  GLUCOSE 95 156*  BUN 23 16  CREATININE 1.09* 0.90  CALCIUM 9.1 8.6*   GFR: Estimated Creatinine Clearance: 66.1 mL/min (by C-G formula based on SCr of 0.9 mg/dL). Liver Function Tests: Recent Labs  Lab 12/03/2020 1925   AST 132*  ALT 29  ALKPHOS 102  BILITOT 0.7  PROT 7.4  ALBUMIN 3.5   No results for input(s): LIPASE, AMYLASE in the last 168 hours. No results for input(s): AMMONIA in the last 168 hours. Coagulation Profile: No results for input(s): INR, PROTIME in the last 168 hours. Cardiac Enzymes: No results for input(s): CKTOTAL, CKMB, CKMBINDEX, TROPONINI in the last 168 hours. BNP (last 3 results) No results for input(s): PROBNP in the last 8760 hours. HbA1C: No results for input(s): HGBA1C in the last 72 hours. CBG: No results for input(s): GLUCAP in the last 168 hours. Lipid Profile: Recent Labs    11/12/2020 1925  TRIG 221*   Thyroid Function Tests: No results for input(s): TSH,  T4TOTAL, FREET4, T3FREE, THYROIDAB in the last 72 hours. Anemia Panel: No results for input(s): VITAMINB12, FOLATE, FERRITIN, TIBC, IRON, RETICCTPCT in the last 72 hours. Urine analysis:    Component Value Date/Time   COLORURINE YELLOW 11/26/2020 1942   APPEARANCEUR HAZY (A) 11/26/2020 1942   LABSPEC 1.025 11/26/2020 1942   PHURINE 5.0 11/26/2020 1942   GLUCOSEU NEGATIVE 11/26/2020 1942   HGBUR NEGATIVE 11/26/2020 1942   HGBUR negative 02/26/2010 1140   BILIRUBINUR NEGATIVE 11/26/2020 1942   KETONESUR NEGATIVE 11/26/2020 1942   PROTEINUR 30 (A) 11/26/2020 1942   UROBILINOGEN 0.2 01/26/2015 0016   NITRITE NEGATIVE 11/26/2020 1942   LEUKOCYTESUR NEGATIVE 11/26/2020 1942   Sepsis Labs: @LABRCNTIP (procalcitonin:4,lacticidven:4) ) Recent Results (from the past 240 hour(s))  Novel Coronavirus, NAA ([facility])     Status: Abnormal   Collection Time: 11/24/20  6:09 PM   Specimen: Nasopharyngeal Swab; Nasopharyngeal(NP) swabs in vial transport medium   Nasopharynge  Result Value Ref Range Status   SARS-CoV-2, NAA Detected (A) Not Detected Final    Comment: Patients who have a positive COVID-19 test result may now have treatment options. Treatment options are available for patients with mild to  moderate symptoms and for hospitalized patients. Visit our website at [email or web address] for resources and information. This nucleic acid amplification test was developed and its performance characteristics determined by [employer]. Nucleic acid amplification tests include RT-PCR and TMA. This test has not been FDA cleared or approved. This test has been authorized by FDA under an Emergency Use Authorization (EUA). This test is only authorized for the duration of time the declaration that circumstances exist justifying the authorization of the emergency use of in vitro diagnostic tests for detection of SARS-CoV-2 virus and/or diagnosis of COVID-19 infection under section 564(b)(1) of the Act, 21 U.S.C. 502DXA-1(O) (1), unless the authorization is terminated or revoked sooner. When diagnostic testing is negativ e, the possibility of a false negative result should be considered in the context of a patient's recent exposures and the presence of clinical signs and symptoms consistent with COVID-19. An individual without symptoms of COVID-19 and who is not shedding SARS-CoV-2 virus would expect to have a negative (not detected) result in this assay.   SARS-COV-2, NAA 2 DAY TAT     Status: None   Collection Time: 11/24/20  6:09 PM   Nasopharynge  Result Value Ref Range Status   SARS-CoV-2, NAA 2 DAY TAT Performed  Final  Resp Panel by RT-PCR (Flu A&B, Covid) Nasopharyngeal Swab     Status: Abnormal   Collection Time: 11/26/20  7:50 PM   Specimen: Nasopharyngeal Swab; Nasopharyngeal(NP) swabs in vial transport medium  Result Value Ref Range Status   SARS Coronavirus 2 by RT PCR POSITIVE (A) NEGATIVE Final    Comment: RESULT CALLED TO, READ BACK BY AND VERIFIED WITH: NASH J. 12.16.21 @ 2154 BY MECIAL J. (NOTE) SARS-CoV-2 target nucleic acids are DETECTED.  The SARS-CoV-2 RNA is generally detectable in upper respiratory specimens during the acute phase of infection.  Positive results are indicative of the presence of the identified virus, but do not rule out bacterial infection or co-infection with other pathogens not detected by the test. Clinical correlation with patient history and other diagnostic information is necessary to determine patient infection status. The expected result is Negative.  Fact Sheet for Patients: EntrepreneurPulse.com.au  Fact Sheet for Healthcare Providers: IncredibleEmployment.be  This test is not yet approved or cleared by the [place] FDA and  has been authorized for detection and/or  diagnosis of SARS-CoV-2 by FDA under an Emergency Use Authorization (EUA).  This EUA will remain in effect (meaning this test can  be used) for the duration of  the COVID-19 declaration under Section 564(b)(1) of the Act, 21 U.S.C. section 360bbb-3(b)(1), unless the authorization is terminated or revoked sooner.     Influenza A by PCR NEGATIVE NEGATIVE Final   Influenza B by PCR NEGATIVE NEGATIVE Final    Comment: (NOTE) The Xpert Xpress SARS-CoV-2/FLU/RSV plus assay is intended as an aid in the diagnosis of influenza from Nasopharyngeal swab specimens and should not be used as a sole basis for treatment. Nasal washings and aspirates are unacceptable for Xpert Xpress SARS-CoV-2/FLU/RSV testing.  Fact Sheet for Patients: EntrepreneurPulse.com.au  Fact Sheet for Healthcare Providers: IncredibleEmployment.be  This test is not yet approved or cleared by the [place] FDA and has been authorized for detection and/or diagnosis of SARS-CoV-2 by FDA under an Emergency Use Authorization (EUA). This EUA will remain in effect (meaning this test can be used) for the duration of the COVID-19 declaration under Section 564(b)(1) of the Act, 21 U.S.C. section 360bbb-3(b)(1), unless the authorization is terminated or revoked.  Performed at [facility], [place] [address] [place], [place] [address]      Radiological Exams on Admission: DG Chest Port 1 View  Result Date: 12/04/2020 CLINICAL DATA:  Shortness of breath. EXAM: PORTABLE CHEST 1 VIEW COMPARISON:  11/26/2020 FINDINGS: There are progressive hazy airspace opacities bilaterally with prominent interstitial lung markings. There is no pneumothorax. The heart size is stable. There is developing consolidation at the lung bases, left greater than right. IMPRESSION: Progressive hazy bilateral airspace opacities consistent with the patient's history of COVID-19 pneumonia. Electronically Signed   By: Constance Holster M.D.   On: 12/06/2020 20:36    EKG: Independently reviewed.  Sinus tachycardia with a rate of 110 and ventricular tachycardia noted.  Assessment/Plan Principal Problem:   Acute respiratory failure due to COVID-19 [facility]) Active Problems:   Hypothyroidism   HYPERTENSION, BENIGN ESSENTIAL   Cigarette smoker     #1 acute respiratory failure with hypoxia secondary to COVID-19 pneumonia: Patient will be admitted.  Will be admitted to stepdown due to high oxygen demand.  Continue on 15 L at the moment and titrate as tolerated.  Initiate remdesivir, dexamethasone, Olumiant, breathing treatment, empiric antibiotic for possible secondary bacterial pneumonia.  Daily labs  #2 hypothyroidism: Continue levothyroxine  #3 essential hypertension: Confirm on resume home regimen  #4 tobacco abuse: Offered nicotine patch   DVT prophylaxis: Lovenox Code Status: Full code Family Communication: No family at bedside Disposition Plan: Home Consults called: None Admission status: Inpatient  Severity of Illness: The appropriate patient status for this patient is INPATIENT. Inpatient status is judged to be reasonable and necessary in order to provide the required intensity of service to ensure the patient's safety. The patient's presenting symptoms, physical exam findings, and  initial radiographic and laboratory data in the context of their chronic comorbidities is felt to place them at high risk for further clinical deterioration. Furthermore, it is not anticipated that the patient will be medically stable for discharge from the hospital within 2 midnights of admission. The following factors support the patient status of inpatient.   " The patient's presenting symptoms include fever and shortness of breath. " The worrisome physical exam findings include coarse breath sound bilaterally. " The initial radiographic and laboratory data are worrisome because of COVID-19 positive with bilateral infiltrates. " The chronic co-morbidities  include hypertension.   * I certify that at the point of admission it is my clinical judgment that the patient will require inpatient hospital care spanning beyond 2 midnights from the point of admission due to high intensity of service, high risk for further deterioration and high frequency of surveillance required.Barbette Merino MD [facility] Pager [phone]  If 7PM-7AM, please contact night-coverage [email or web address] Password [facility]  11/25/2020, 9:19 PM

## 2020-11-30 DIAGNOSIS — J9601 Acute respiratory failure with hypoxia: Secondary | ICD-10-CM | POA: Diagnosis present

## 2020-11-30 LAB — CBC WITH DIFFERENTIAL/PLATELET
Abs Immature Granulocytes: 0.11 10*3/uL — ABNORMAL HIGH (ref 0.00–0.07)
Basophils Absolute: 0 10*3/uL (ref 0.0–0.1)
Basophils Relative: 0 %
Eosinophils Absolute: 0 10*3/uL (ref 0.0–0.5)
Eosinophils Relative: 0 %
HCT: 37.3 % (ref 36.0–46.0)
Hemoglobin: 12.3 g/dL (ref 12.0–15.0)
Immature Granulocytes: 1 %
Lymphocytes Relative: 16 %
Lymphs Abs: 1.5 10*3/uL (ref 0.7–4.0)
MCH: 35.2 pg — ABNORMAL HIGH (ref 26.0–34.0)
MCHC: 33 g/dL (ref 30.0–36.0)
MCV: 106.9 fL — ABNORMAL HIGH (ref 80.0–100.0)
Monocytes Absolute: 0.4 10*3/uL (ref 0.1–1.0)
Monocytes Relative: 4 %
Neutro Abs: 7.4 10*3/uL (ref 1.7–7.7)
Neutrophils Relative %: 79 %
Platelets: 143 10*3/uL — ABNORMAL LOW (ref 150–400)
RBC: 3.49 MIL/uL — ABNORMAL LOW (ref 3.87–5.11)
RDW: 12.8 % (ref 11.5–15.5)
WBC: 9.4 10*3/uL (ref 4.0–10.5)
nRBC: 0.4 % — ABNORMAL HIGH (ref 0.0–0.2)

## 2020-11-30 LAB — BLOOD CULTURE ID PANEL (REFLEXED) - BCID2

## 2020-11-30 LAB — HEMOGLOBIN A1C
Hgb A1c MFr Bld: 4.9 % (ref 4.8–5.6)
Mean Plasma Glucose: 93.93 mg/dL

## 2020-11-30 LAB — FIBRINOGEN: Fibrinogen: 609 mg/dL — ABNORMAL HIGH (ref 210–475)

## 2020-11-30 LAB — CBG MONITORING, ED
Glucose-Capillary: 162 mg/dL — ABNORMAL HIGH (ref 70–99)
Glucose-Capillary: 138 mg/dL — ABNORMAL HIGH (ref 70–99)
Glucose-Capillary: 158 mg/dL — ABNORMAL HIGH (ref 70–99)

## 2020-11-30 LAB — FERRITIN
Ferritin: >10500 ng/mL — ABNORMAL HIGH (ref 11–307)
Ferritin: >7500 ng/mL — ABNORMAL HIGH (ref 11–307)

## 2020-11-30 LAB — COMPREHENSIVE METABOLIC PANEL
ALT: 33 U/L (ref 0–44)
AST: 152 U/L — ABNORMAL HIGH (ref 15–41)
Albumin: 3.5 g/dL (ref 3.5–5.0)
Alkaline Phosphatase: 114 U/L (ref 38–126)
Anion gap: 13 (ref 5–15)
BUN: 16 mg/dL (ref 8–23)
CO2: 21 mmol/L — ABNORMAL LOW (ref 22–32)
Calcium: 8.5 mg/dL — ABNORMAL LOW (ref 8.9–10.3)
Chloride: 109 mmol/L (ref 98–111)
Creatinine, Ser: 0.95 mg/dL (ref 0.44–1.00)
GFR, Estimated: >60 mL/min (ref >60)
Glucose, Bld: 200 mg/dL — ABNORMAL HIGH (ref 70–99)
Potassium: 4.1 mmol/L (ref 3.5–5.1)
Sodium: 143 mmol/L (ref 135–145)
Total Bilirubin: 0.5 mg/dL (ref 0.3–1.2)
Total Protein: 7.7 g/dL (ref 6.5–8.1)

## 2020-11-30 LAB — MAGNESIUM: Magnesium: 2.1 mg/dL (ref 1.7–2.4)

## 2020-11-30 LAB — HIV ANTIBODY (ROUTINE TESTING W REFLEX): HIV Screen 4th Generation wRfx: NONREACTIVE

## 2020-11-30 LAB — D-DIMER, QUANTITATIVE
D-Dimer, Quant: 9.82 ug/mL-FEU — ABNORMAL HIGH (ref 0.00–0.50)
D-Dimer, Quant: 9.95 ug/mL-FEU — ABNORMAL HIGH (ref 0.00–0.50)

## 2020-11-30 LAB — PHOSPHORUS: Phosphorus: 2.9 mg/dL (ref 2.5–4.6)

## 2020-11-30 LAB — C-REACTIVE PROTEIN: CRP: 19.8 mg/dL — ABNORMAL HIGH (ref <1.0)

## 2020-11-30 MED ORDER — FAMOTIDINE 20 MG PO TABS
20.0000 mg | ORAL_TABLET | Freq: Every day | ORAL | Status: DC
  Administered 2020-11-30 – 2020-12-05 (×7): 20 mg via ORAL
  Filled 2020-11-30 (×8): qty 1

## 2020-11-30 MED ORDER — BARICITINIB 2 MG PO TABS: 2.0000 mg | ORAL_TABLET | Freq: Every day | ORAL | Status: DC

## 2020-11-30 MED ORDER — ZINC SULFATE 220 (50 ZN) MG PO CAPS
220.0000 mg | ORAL_CAPSULE | Freq: Every day | ORAL | Status: DC
  Administered 2020-11-30 – 2020-12-06 (×7): 220 mg via ORAL
  Filled 2020-11-30 (×7): qty 1

## 2020-11-30 MED ORDER — ENOXAPARIN SODIUM 40 MG/0.4ML ~~LOC~~ SOLN
40.0000 mg | SUBCUTANEOUS | Status: DC
  Administered 2020-11-30 – 2020-12-06 (×7): 40 mg via SUBCUTANEOUS
  Filled 2020-11-30 (×8): qty 0.4

## 2020-11-30 MED ORDER — GABAPENTIN 300 MG PO CAPS
300.0000 mg | ORAL_CAPSULE | Freq: Three times a day (TID) | ORAL | Status: DC
  Administered 2020-11-30 – 2020-12-04 (×16): 300 mg via ORAL
  Filled 2020-11-30 (×19): qty 1

## 2020-11-30 MED ORDER — PREDNISONE 50 MG PO TABS
50.0000 mg | ORAL_TABLET | Freq: Every day | ORAL | Status: DC
  Administered 2020-12-03: 17:00:00 50 mg via ORAL
  Filled 2020-11-30: qty 1

## 2020-11-30 MED ORDER — VITAMIN B-12 100 MCG PO TABS
100.0000 ug | ORAL_TABLET | Freq: Every day | ORAL | Status: DC
  Administered 2020-11-30 – 2020-12-04 (×4): 100 ug via ORAL
  Filled 2020-11-30 (×7): qty 1

## 2020-11-30 MED ORDER — LORATADINE 10 MG PO TABS
10.0000 mg | ORAL_TABLET | Freq: Every day | ORAL | Status: DC
  Administered 2020-11-30 – 2020-12-06 (×7): 10 mg via ORAL
  Filled 2020-11-30 (×7): qty 1

## 2020-11-30 MED ORDER — BARICITINIB 2 MG PO TABS
4.0000 mg | ORAL_TABLET | Freq: Every day | ORAL | Status: DC
  Administered 2020-11-30 – 2020-12-03 (×4): 4 mg via ORAL
  Filled 2020-11-30 (×4): qty 2

## 2020-11-30 MED ORDER — METHYLPREDNISOLONE SODIUM SUCC 125 MG IJ SOLR
1.0000 mg/kg | Freq: Two times a day (BID) | INTRAMUSCULAR | Status: AC
  Administered 2020-11-30 – 2020-12-03 (×6): 82.5 mg via INTRAVENOUS
  Filled 2020-11-30 (×6): qty 2

## 2020-11-30 MED ORDER — IPRATROPIUM-ALBUTEROL 20-100 MCG/ACT IN AERS
1.0000 | INHALATION_SPRAY | Freq: Four times a day (QID) | RESPIRATORY_TRACT | Status: DC
  Administered 2020-11-30 – 2020-12-06 (×26): 1 via RESPIRATORY_TRACT
  Filled 2020-11-30: qty 4

## 2020-11-30 MED ORDER — MOMETASONE FURO-FORMOTEROL FUM 200-5 MCG/ACT IN AERO
2.0000 | INHALATION_SPRAY | Freq: Two times a day (BID) | RESPIRATORY_TRACT | Status: DC
  Administered 2020-11-30 – 2020-12-07 (×15): 2 via RESPIRATORY_TRACT
  Filled 2020-11-30: qty 8.8

## 2020-11-30 MED ORDER — INSULIN ASPART 100 UNIT/ML ~~LOC~~ SOLN
0.0000 [IU] | Freq: Three times a day (TID) | SUBCUTANEOUS | Status: DC
  Administered 2020-11-30: 09:00:00 2 [IU] via SUBCUTANEOUS
  Administered 2020-11-30: 12:00:00 1 [IU] via SUBCUTANEOUS
  Administered 2020-11-30 – 2020-12-01 (×2): 2 [IU] via SUBCUTANEOUS
  Administered 2020-12-01: 19:00:00 3 [IU] via SUBCUTANEOUS
  Administered 2020-12-02: 18:00:00 2 [IU] via SUBCUTANEOUS
  Administered 2020-12-02 – 2020-12-03 (×3): 1 [IU] via SUBCUTANEOUS
  Administered 2020-12-03: 13:00:00 7 [IU] via SUBCUTANEOUS
  Administered 2020-12-03: 17:00:00 1 [IU] via SUBCUTANEOUS
  Administered 2020-12-04: 13:00:00 2 [IU] via SUBCUTANEOUS
  Administered 2020-12-04: 18:00:00 5 [IU] via SUBCUTANEOUS
  Administered 2020-12-04: 09:00:00 2 [IU] via SUBCUTANEOUS
  Administered 2020-12-05 (×2): 3 [IU] via SUBCUTANEOUS
  Administered 2020-12-05: 09:00:00 2 [IU] via SUBCUTANEOUS
  Administered 2020-12-06: 08:00:00 3 [IU] via SUBCUTANEOUS
  Administered 2020-12-06: 12:00:00 2 [IU] via SUBCUTANEOUS
  Filled 2020-11-30: qty 0.09

## 2020-11-30 MED ORDER — FLUTICASONE PROPIONATE 50 MCG/ACT NA SUSP
1.0000 | Freq: Every day | NASAL | Status: DC
  Administered 2020-11-30 – 2020-12-08 (×9): 1 via NASAL
  Filled 2020-11-30: qty 16

## 2020-11-30 MED ORDER — LEVOTHYROXINE SODIUM 25 MCG PO TABS
125.0000 ug | ORAL_TABLET | Freq: Every day | ORAL | Status: DC
  Administered 2020-11-30 – 2020-12-06 (×7): 125 ug via ORAL
  Filled 2020-11-30 (×8): qty 1

## 2020-11-30 MED ORDER — ASCORBIC ACID 500 MG PO TABS
500.0000 mg | ORAL_TABLET | Freq: Every day | ORAL | Status: DC
  Administered 2020-11-30 – 2020-12-06 (×7): 500 mg via ORAL
  Filled 2020-11-30 (×6): qty 1

## 2020-11-30 MED ORDER — HYDROCHLOROTHIAZIDE 25 MG PO TABS
25.0000 mg | ORAL_TABLET | Freq: Every day | ORAL | Status: DC
  Administered 2020-11-30: 09:00:00 25 mg via ORAL
  Filled 2020-11-30 (×2): qty 1

## 2020-11-30 MED ORDER — VITAMIN D 25 MCG (1000 UNIT) PO TABS
1000.0000 [IU] | ORAL_TABLET | Freq: Every day | ORAL | Status: DC
  Administered 2020-11-30 – 2020-12-06 (×6): 1000 [IU] via ORAL
  Filled 2020-11-30 (×6): qty 1

## 2020-11-30 MED ORDER — DEXAMETHASONE SODIUM PHOSPHATE 10 MG/ML IJ SOLN
6.0000 mg | INTRAMUSCULAR | Status: DC
  Administered 2020-11-30: 03:00:00 6 mg via INTRAVENOUS
  Filled 2020-11-30: qty 1

## 2020-11-30 MED ORDER — IBUPROFEN 800 MG PO TABS: 800.0000 mg | ORAL_TABLET | Freq: Three times a day (TID) | ORAL | Status: DC | PRN

## 2020-11-30 NOTE — ED Notes (Signed)
Pt daughter updated on Pt condition and POC by this RN.

## 2020-11-30 NOTE — ED Notes (Signed)
Initial contact with pt. Pt is agitated that she I still in the ER and asking when she will be taken to room. Charge nurse made aware.

## 2020-11-30 NOTE — Progress Notes (Signed)
PHARMACY - PHYSICIAN COMMUNICATION CRITICAL VALUE ALERT - BLOOD CULTURE IDENTIFICATION (BCID)  [name] is an 69 y.o. female who presented to [facility] on 11/24/2020 with a chief complaint of COVID PNA  Assessment:  69 yo F being treated for COVID PNA.   Aerobic bottle of 1 blood cx set growing GPCC (BCID + Staph species, no resistance) which is likely contaminant.   Name of physician (or Provider) Contacted: Ardith Dark, NP  Current antibiotics: Zithromax  Changes to prescribed antibiotics recommended:  No changes recommended.   Results for orders placed or performed during the hospital encounter of 12/05/2020  Blood Culture ID Panel (Reflexed) (Collected: 11/30/2020 10:11 PM)  Result Value Ref Range   Enterococcus faecalis NOT DETECTED NOT DETECTED   Enterococcus Faecium NOT DETECTED NOT DETECTED   Listeria monocytogenes NOT DETECTED NOT DETECTED   Staphylococcus species DETECTED (A) NOT DETECTED   Staphylococcus aureus (BCID) NOT DETECTED NOT DETECTED   Staphylococcus epidermidis NOT DETECTED NOT DETECTED   Staphylococcus lugdunensis NOT DETECTED NOT DETECTED   Streptococcus species NOT DETECTED NOT DETECTED   Streptococcus agalactiae NOT DETECTED NOT DETECTED   Streptococcus pneumoniae NOT DETECTED NOT DETECTED   Streptococcus pyogenes NOT DETECTED NOT DETECTED   A.calcoaceticus-baumannii NOT DETECTED NOT DETECTED   Bacteroides fragilis NOT DETECTED NOT DETECTED   Enterobacterales NOT DETECTED NOT DETECTED   Enterobacter cloacae complex NOT DETECTED NOT DETECTED   Escherichia coli NOT DETECTED NOT DETECTED   Klebsiella aerogenes NOT DETECTED NOT DETECTED   Klebsiella oxytoca NOT DETECTED NOT DETECTED   Klebsiella pneumoniae NOT DETECTED NOT DETECTED   Proteus species NOT DETECTED NOT DETECTED   Salmonella species NOT DETECTED NOT DETECTED   Serratia marcescens NOT DETECTED NOT DETECTED   Haemophilus influenzae NOT DETECTED NOT DETECTED   Neisseria meningitidis NOT  DETECTED NOT DETECTED   Pseudomonas aeruginosa NOT DETECTED NOT DETECTED   Stenotrophomonas maltophilia NOT DETECTED NOT DETECTED   Candida albicans NOT DETECTED NOT DETECTED   Candida auris NOT DETECTED NOT DETECTED   Candida glabrata NOT DETECTED NOT DETECTED   Candida krusei NOT DETECTED NOT DETECTED   Candida parapsilosis NOT DETECTED NOT DETECTED   Candida tropicalis NOT DETECTED NOT DETECTED   Cryptococcus neoformans/gattii NOT DETECTED NOT DETECTED    Netta Cedars PharmD 11/30/2020  11:52 PM

## 2020-11-30 NOTE — Progress Notes (Signed)
PROGRESS NOTE  [name] WUJ:[id number] DOB: 1951/07/27 DOA: 11/28/2020 PCP: Rogers Blocker, MD  HPI/Recap of past 24 hours: [name] is a 69 y.o. female with medical history significant of hypothyroidism, hypertension, GERD, depression, tobacco abuse, osteoarthritis who was diagnosed with COVID-19 5 days ago in the ER.  Patient came back to the ER due to worsening symptoms of vomiting fever and oxygen sat found to be in the 80s today.  Initially was placed on 2 L but still not improved much.  Patient ultimately placed on 15 L with oxygen sats in the 90s.  Temperature was 103 on arrival.  Patient also noted acutely ill and debilitated.  Chest x-ray showed diffuse infiltrates consistent with COVID-19 pneumonia so patient has been admitted to the hospital for further evaluation and treatment.  ED Course: Temperature 101.3, blood pressure 134/100, pulse 113 respiratory of 34 oxygen sats 60% on room air and currently 95% on 15 L.  White count 7.5 hemoglobin 12.0 platelets 135.  Sodium 142 potassium 3.7 chloride 110 CO2 20 creatinine 0.99 calcium 8.6.  LDH 1164 triglyceride 221 CRP 17.8 lactic acid 1.8 procalcitonin 0.1 and lactic acid 1.5.  Chest x-ray showed progressive hazy airspace opacities bilaterally with prominent interstitial lung markings.  Consistent with COVID-19 pneumonia.  11/30/20: Seen and examined at bedside.  She reports persistent nonproductive cough.  She denies any chest pain.  Assessment/Plan: Principal Problem:   Acute respiratory failure due to COVID-19 [facility]) Active Problems:   Hypothyroidism   HYPERTENSION, BENIGN ESSENTIAL   Cigarette smoker   Acute hypoxemic respiratory failure due to COVID-19 [facility])  Acute hypoxic respiratory failure secondary to COVID-19 viral pneumonia, POA Presented with dyspnea and hypoxia, COVID-19 screening test positive on 11/26/2020.  Chest x-ray consistent with COVID-19 viral pneumonia. She is not on oxygen supplementation at  baseline She is currently requiring 15 L to maintain O2 saturation greater than 90%.  Wean off as tolerated. She was started on remdesivir day 2, baricitinib day 1, and Decadron.  We will switch Decadron to IV Solu-Medrol twice daily on 11/30/2020. Add vitamin C, D3 and zinc. Continue bronchodilators Continue incentive spirometer and flutter valve Pronate or side sleeping as tolerated Mobilize as tolerated Maintain O2 saturation greater than 92%.  COVID-19 viral pneumonia, POA Independent reviewed chest x-ray done on admission which showed bilateral pulmonary infiltrates consistent with COVID-19 viral pneumonia Trend inflammatory markers Rest of management as stated above.  Steroid-induced hyperglycemia Hemoglobin A1c 4.9 on 11/30/2020 Continue insulin coverage.  Elevated liver chemistries Elevated AST, closely monitor Avoid hepatotoxic agent Repeat CMP in the morning  Hypothyroidism Resume home levothyroxine  Essential hypertension BP is currently at goal Continue home oral antihypertensives Continue to monitor vital signs  Peripheral neuropathy Resume home regimen.  GERD Resume home regimen.   Code Status: Full code.  Family Communication: None at bedside.  Disposition Plan: Likely will discharge to home in the next 48 hours once oxygen requirement is less than 6 L.   Consultants:  None.  Procedures:  None.  Antimicrobials:  None.  DVT prophylaxis: Subcu Lovenox daily  Status is: Inpatient    Dispo:  Patient From: Home  Planned Disposition: Home  Expected discharge date: 12/04/2020  Medically stable for discharge: No, ongoing management of acute hypoxic respiratory failure secondary to COVID-19 viral pneumonia, POA.         Objective: Vitals:   11/30/20 0444 11/30/20 0648 11/30/20 1000 11/30/20 1300  BP:  111/68 120/67 129/81  Pulse:  78  89 (!) 102  Resp:  (!) 28 (!) 30 (!) 34  Temp: 98.2 F (36.8 C)  97.9 F (36.6 C)   TempSrc:  Oral  Oral   SpO2:  100% 99% 96%  Weight:      Height:       No intake or output data in the 24 hours ending 11/30/20 1409 Filed Weights   11/24/2020 1935  Weight: 82.6 kg    Exam:  . General: 68 y.o. year-old female ill-appearing in no acute distress.  Alert and oriented x3. . Cardiovascular: Regular rate and rhythm with no rubs or gallops.  No thyromegaly or JVD noted.   Marland Kitchen Respiratory: Diffuse rales bilaterally.  No wheezing noted.  Poor inspiratory effort.   . Abdomen: Soft nontender nondistended with normal bowel sounds x4 quadrants. . Musculoskeletal: No lower extremity edema. 2/4 pulses in all 4 extremities. . Skin: No ulcerative lesions noted or rashes, . Psychiatry: Mood is appropriate for condition and setting   Data Reviewed: CBC: Recent Labs  Lab 11/26/20 1736 11/13/2020 1925 11/30/20 0521  WBC 6.9 7.5 9.4  NEUTROABS  --  6.1 7.4  HGB 12.3 12.8 12.3  HCT 36.6 37.6 37.3  MCV 108.3* 105.9* 106.9*  PLT 128* 135* 161*   Basic Metabolic Panel: Recent Labs  Lab 11/26/20 1736 12/11/2020 1925 11/30/20 0521  NA 137 142 143  K 3.9 3.7 4.1  CL 105 110 109  CO2 22 20* 21*  GLUCOSE 95 156* 200*  BUN 23 16 16   CREATININE 1.09* 0.90 0.95  CALCIUM 9.1 8.6* 8.5*  MG  --   --  2.1  PHOS  --   --  2.9   GFR: Estimated Creatinine Clearance: 62.6 mL/min (by C-G formula based on SCr of 0.95 mg/dL). Liver Function Tests: Recent Labs  Lab 11/28/2020 1925 11/30/20 0521  AST 132* 152*  ALT 29 33  ALKPHOS 102 114  BILITOT 0.7 0.5  PROT 7.4 7.7  ALBUMIN 3.5 3.5   No results for input(s): LIPASE, AMYLASE in the last 168 hours. No results for input(s): AMMONIA in the last 168 hours. Coagulation Profile: No results for input(s): INR, PROTIME in the last 168 hours. Cardiac Enzymes: No results for input(s): CKTOTAL, CKMB, CKMBINDEX, TROPONINI in the last 168 hours. BNP (last 3 results) No results for input(s): PROBNP in the last 8760 hours. HbA1C: Recent Labs     11/30/20 0521  HGBA1C 4.9   CBG: Recent Labs  Lab 11/30/20 0913 11/30/20 1203  GLUCAP 162* 138*   Lipid Profile: Recent Labs    11/11/2020 1925  TRIG 221*   Thyroid Function Tests: No results for input(s): TSH, T4TOTAL, FREET4, T3FREE, THYROIDAB in the last 72 hours. Anemia Panel: Recent Labs    11/26/2020 1925 11/30/20 0521  FERRITIN >10,500* >7,500*   Urine analysis:    Component Value Date/Time   COLORURINE YELLOW 11/26/2020 1942   APPEARANCEUR HAZY (A) 11/26/2020 1942   LABSPEC 1.025 11/26/2020 1942   PHURINE 5.0 11/26/2020 1942   GLUCOSEU NEGATIVE 11/26/2020 1942   HGBUR NEGATIVE 11/26/2020 1942   HGBUR negative 02/26/2010 1140   BILIRUBINUR NEGATIVE 11/26/2020 1942   KETONESUR NEGATIVE 11/26/2020 1942   PROTEINUR 30 (A) 11/26/2020 1942   UROBILINOGEN 0.2 01/26/2015 0016   NITRITE NEGATIVE 11/26/2020 1942   LEUKOCYTESUR NEGATIVE 11/26/2020 1942   Sepsis Labs: @LABRCNTIP (procalcitonin:4,lacticidven:4)  ) Recent Results (from the past 240 hour(s))  Novel Coronavirus, NAA ([facility])     Status: Abnormal   Collection Time: 11/24/20  6:09 PM   Specimen: Nasopharyngeal Swab; Nasopharyngeal(NP) swabs in vial transport medium   Nasopharynge  Result Value Ref Range Status   SARS-CoV-2, NAA Detected (A) Not Detected Final    Comment: Patients who have a positive COVID-19 test result may now have treatment options. Treatment options are available for patients with mild to moderate symptoms and for hospitalized patients. Visit our website at [email or web address] for resources and information. This nucleic acid amplification test was developed and its performance characteristics determined by [employer]. Nucleic acid amplification tests include RT-PCR and TMA. This test has not been FDA cleared or approved. This test has been authorized by FDA under an Emergency Use Authorization (EUA). This test is only authorized for the duration of time the  declaration that circumstances exist justifying the authorization of the emergency use of in vitro diagnostic tests for detection of SARS-CoV-2 virus and/or diagnosis of COVID-19 infection under section 564(b)(1) of the Act, 21 U.S.C. 409WJX-9(J) (1), unless the authorization is terminated or revoked sooner. When diagnostic testing is negativ e, the possibility of a false negative result should be considered in the context of a patient's recent exposures and the presence of clinical signs and symptoms consistent with COVID-19. An individual without symptoms of COVID-19 and who is not shedding SARS-CoV-2 virus would expect to have a negative (not detected) result in this assay.   SARS-COV-2, NAA 2 DAY TAT     Status: None   Collection Time: 11/24/20  6:09 PM   Nasopharynge  Result Value Ref Range Status   SARS-CoV-2, NAA 2 DAY TAT Performed  Final  Resp Panel by RT-PCR (Flu A&B, Covid) Nasopharyngeal Swab     Status: Abnormal   Collection Time: 11/26/20  7:50 PM   Specimen: Nasopharyngeal Swab; Nasopharyngeal(NP) swabs in vial transport medium  Result Value Ref Range Status   SARS Coronavirus 2 by RT PCR POSITIVE (A) NEGATIVE Final    Comment: RESULT CALLED TO, READ BACK BY AND VERIFIED WITH: NASH J. 12.16.21 @ 2154 BY MECIAL J. (NOTE) SARS-CoV-2 target nucleic acids are DETECTED.  The SARS-CoV-2 RNA is generally detectable in upper respiratory specimens during the acute phase of infection. Positive results are indicative of the presence of the identified virus, but do not rule out bacterial infection or co-infection with other pathogens not detected by the test. Clinical correlation with patient history and other diagnostic information is necessary to determine patient infection status. The expected result is Negative.  Fact Sheet for Patients: EntrepreneurPulse.com.au  Fact Sheet for Healthcare Providers: IncredibleEmployment.be  This test  is not yet approved or cleared by the [place] FDA and  has been authorized for detection and/or diagnosis of SARS-CoV-2 by FDA under an Emergency Use Authorization (EUA).  This EUA will remain in effect (meaning this test can  be used) for the duration of  the COVID-19 declaration under Section 564(b)(1) of the Act, 21 U.S.C. section 360bbb-3(b)(1), unless the authorization is terminated or revoked sooner.     Influenza A by PCR NEGATIVE NEGATIVE Final   Influenza B by PCR NEGATIVE NEGATIVE Final    Comment: (NOTE) The Xpert Xpress SARS-CoV-2/FLU/RSV plus assay is intended as an aid in the diagnosis of influenza from Nasopharyngeal swab specimens and should not be used as a sole basis for treatment. Nasal washings and aspirates are unacceptable for Xpert Xpress SARS-CoV-2/FLU/RSV testing.  Fact Sheet for Patients: EntrepreneurPulse.com.au  Fact Sheet for Healthcare Providers: IncredibleEmployment.be  This test is not yet approved or cleared by the [place]  FDA and has been authorized for detection and/or diagnosis of SARS-CoV-2 by FDA under an Emergency Use Authorization (EUA). This EUA will remain in effect (meaning this test can be used) for the duration of the COVID-19 declaration under Section 564(b)(1) of the Act, 21 U.S.C. section 360bbb-3(b)(1), unless the authorization is terminated or revoked.  Performed at [facility], [place] [address], [place], [place] [address]   Blood Culture (routine x 2)     Status: None (Preliminary result)   Collection Time: 11/30/2020  7:44 PM   Specimen: BLOOD  Result Value Ref Range Status   Specimen Description   Final    BLOOD BLOOD RIGHT FOREARM Performed at [facility] [address] [place], [place] [address]    Special Requests   Final    BOTTLES DRAWN AEROBIC AND ANAEROBIC Blood Culture results may not be optimal due to an excessive volume of  blood received in culture bottles Performed at [facility] [address], [place], [place] [address]    Culture   Final    NO GROWTH < 12 HOURS Performed at [facility] [place] [address], [place], [place] [address]    Report Status PENDING  Incomplete  Blood Culture (routine x 2)     Status: None (Preliminary result)   Collection Time: 11/26/2020 10:11 PM   Specimen: BLOOD  Result Value Ref Range Status   Specimen Description   Final    BLOOD RIGHT ANTECUBITAL Performed at [place] [address], [place], [place] [address]    Special Requests   Final    BOTTLES DRAWN AEROBIC AND ANAEROBIC Blood Culture adequate volume Performed at [facility] [address] [place], [place] [address]    Culture   Final    NO GROWTH < 12 HOURS Performed at [facility] [address] [place], [place] [address]    Report Status PENDING  Incomplete      Studies: DG Chest Port 1 View  Result Date: 12/10/2020 CLINICAL DATA:  Shortness of breath. EXAM: PORTABLE CHEST 1 VIEW COMPARISON:  11/26/2020 FINDINGS: There are progressive hazy airspace opacities bilaterally with prominent interstitial lung markings. There is no pneumothorax. The heart size is stable. There is developing consolidation at the lung bases, left greater than right. IMPRESSION: Progressive hazy bilateral airspace opacities consistent with the patient's history of COVID-19 pneumonia. Electronically Signed   By: Constance Holster M.D.   On: 11/28/2020 20:36    Scheduled Meds: . baricitinib  4 mg Oral Daily  . dexamethasone (DECADRON) injection  6 mg Intravenous Q24H  . enoxaparin (LOVENOX) injection  40 mg Subcutaneous Q24H  . famotidine  20 mg Oral QHS  . fluticasone  1 spray Each Nare Daily  . gabapentin  300 mg Oral TID  . hydrochlorothiazide  25 mg Oral Daily  . insulin aspart  0-9 Units Subcutaneous TID WC  . Ipratropium-Albuterol  1 puff  Inhalation Q6H  . levothyroxine  125 mcg Oral Q0600  . loratadine  10 mg Oral Daily  . mometasone-formoterol  2 puff Inhalation BID  . vitamin B-12  100 mcg Oral Daily    Continuous Infusions: . azithromycin Stopped (11/30/20 0007)  . remdesivir 100 mg in NS 100 mL 100 mg (11/30/20 0924)     LOS: 1 day     Kayleen Memos, MD [facility] Pager [phone]  If 7PM-7AM, please contact night-coverage [email or web address] Password [facility] 11/30/2020, 2:09  PM

## 2020-11-30 NOTE — ED Notes (Signed)
Pt given lunch tray and repositioned in bed.

## 2020-11-30 NOTE — ED Notes (Signed)
Pt has been medicated per MD orders. Pt has been repositioned and room has been tidied up.

## 2020-11-30 NOTE — ED Notes (Signed)
Pt continues to rest at bedside. No change in status. Pt waiting for room assignment. Will continue to monitor.

## 2020-11-30 NOTE — ED Notes (Signed)
Pt cleaned up; brief changed; purewick changed and pt repositioned in bed

## 2020-12-01 ENCOUNTER — Inpatient Hospital Stay (HOSPITAL_COMMUNITY): Payer: Medicare Other

## 2020-12-01 LAB — CBC WITH DIFFERENTIAL/PLATELET
Abs Immature Granulocytes: 0.24 10*3/uL — ABNORMAL HIGH (ref 0.00–0.07)
Basophils Absolute: 0 10*3/uL (ref 0.0–0.1)
Basophils Relative: 0 %
Eosinophils Absolute: 0 10*3/uL (ref 0.0–0.5)
Eosinophils Relative: 0 %
HCT: 35.9 % — ABNORMAL LOW (ref 36.0–46.0)
Hemoglobin: 11.8 g/dL — ABNORMAL LOW (ref 12.0–15.0)
Immature Granulocytes: 2 %
Lymphocytes Relative: 17 %
Lymphs Abs: 2 10*3/uL (ref 0.7–4.0)
MCH: 35.8 pg — ABNORMAL HIGH (ref 26.0–34.0)
MCHC: 32.9 g/dL (ref 30.0–36.0)
MCV: 108.8 fL — ABNORMAL HIGH (ref 80.0–100.0)
Monocytes Absolute: 1.1 10*3/uL — ABNORMAL HIGH (ref 0.1–1.0)
Monocytes Relative: 10 %
Neutro Abs: 8.3 10*3/uL — ABNORMAL HIGH (ref 1.7–7.7)
Neutrophils Relative %: 71 %
Platelets: 183 10*3/uL (ref 150–400)
RBC: 3.3 MIL/uL — ABNORMAL LOW (ref 3.87–5.11)
RDW: 12.9 % (ref 11.5–15.5)
WBC: 11.8 10*3/uL — ABNORMAL HIGH (ref 4.0–10.5)
nRBC: 0.8 % — ABNORMAL HIGH (ref 0.0–0.2)

## 2020-12-01 LAB — COMPREHENSIVE METABOLIC PANEL
ALT: 35 U/L (ref 0–44)
AST: 128 U/L — ABNORMAL HIGH (ref 15–41)
Albumin: 3.2 g/dL — ABNORMAL LOW (ref 3.5–5.0)
Alkaline Phosphatase: 110 U/L (ref 38–126)
Anion gap: 13 (ref 5–15)
BUN: 28 mg/dL — ABNORMAL HIGH (ref 8–23)
CO2: 23 mmol/L (ref 22–32)
Calcium: 8.6 mg/dL — ABNORMAL LOW (ref 8.9–10.3)
Chloride: 107 mmol/L (ref 98–111)
Creatinine, Ser: 0.92 mg/dL (ref 0.44–1.00)
GFR, Estimated: >60 mL/min (ref >60)
Glucose, Bld: 152 mg/dL — ABNORMAL HIGH (ref 70–99)
Potassium: 4.2 mmol/L (ref 3.5–5.1)
Sodium: 143 mmol/L (ref 135–145)
Total Bilirubin: 1.1 mg/dL (ref 0.3–1.2)
Total Protein: 7.2 g/dL (ref 6.5–8.1)

## 2020-12-01 LAB — CULTURE, BLOOD (ROUTINE X 2): Special Requests: ADEQUATE

## 2020-12-01 LAB — C-REACTIVE PROTEIN: CRP: 9.2 mg/dL — ABNORMAL HIGH (ref <1.0)

## 2020-12-01 LAB — GLUCOSE, CAPILLARY
Glucose-Capillary: 206 mg/dL — ABNORMAL HIGH (ref 70–99)
Glucose-Capillary: 156 mg/dL — ABNORMAL HIGH (ref 70–99)

## 2020-12-01 LAB — PHOSPHORUS: Phosphorus: 2.4 mg/dL — ABNORMAL LOW (ref 2.5–4.6)

## 2020-12-01 LAB — D-DIMER, QUANTITATIVE: D-Dimer, Quant: >20 ug/mL-FEU — ABNORMAL HIGH (ref 0.00–0.50)

## 2020-12-01 LAB — CBG MONITORING, ED: Glucose-Capillary: 156 mg/dL — ABNORMAL HIGH (ref 70–99)

## 2020-12-01 LAB — MAGNESIUM: Magnesium: 2.3 mg/dL (ref 1.7–2.4)

## 2020-12-01 LAB — FERRITIN: Ferritin: >7500 ng/mL — ABNORMAL HIGH (ref 11–307)

## 2020-12-01 MED ORDER — IOHEXOL 350 MG/ML SOLN
100.0000 mL | Freq: Once | INTRAVENOUS | Status: AC | PRN
  Administered 2020-12-01: 16:00:00 100 mL via INTRAVENOUS

## 2020-12-01 MED ORDER — FUROSEMIDE 10 MG/ML IJ SOLN
20.0000 mg | Freq: Once | INTRAMUSCULAR | Status: AC
  Administered 2020-12-01: 08:00:00 20 mg via INTRAVENOUS
  Filled 2020-12-01: qty 4

## 2020-12-01 MED ORDER — POTASSIUM CHLORIDE CRYS ER 20 MEQ PO TBCR
20.0000 meq | EXTENDED_RELEASE_TABLET | Freq: Once | ORAL | Status: AC
  Administered 2020-12-01: 07:00:00 20 meq via ORAL
  Filled 2020-12-01: qty 1

## 2020-12-01 NOTE — Progress Notes (Signed)
PT Cancellation Note  Patient Details Name: [name] MRN: [id number] DOB: 01/13/1951   Cancelled Treatment:    Reason Eval/Treat Not Completed: Patient at procedure or test/unavailable, on the way to CT.    Claretha Cooper 12/01/2020, 4:40 PM [place] Pager [phone] Office [phone]

## 2020-12-01 NOTE — ED Notes (Addendum)
Called report to Iris, RN on 4W 

## 2020-12-01 NOTE — Progress Notes (Signed)
PROGRESS NOTE  LYLITH KERTIS [id number] DOB: 01/05/1951 DOA: 11/26/2020 PCP: Rogers Blocker, MD  HPI/Recap of past 24 hours: [name] is a 69 y.o. female with medical history significant of hypothyroidism, hypertension, GERD, depression, tobacco abuse, osteoarthritis who was diagnosed with COVID-19 5 days prior to admission in the ER.  Patient came back to the ER due to worsening symptoms of vomiting fever and sats in the 80s.  Initially was placed on 2 L but still did not improve.  Patient ultimately placed on 15 L with oxygen sats in the mid 90s.  Temperature was 101.7 on arrival.  Chest x-ray showed diffuse infiltrates consistent with COVID-19 pneumonia.  She was started on Remdesivir and Baricitinib on admission.  Also on IV solumedrol BID, bronchodilators and vitamin C, D3, and Zinc.  12/01/20: Seen and examined at her bedside in the ED.  She states her respiration is improved however she is still quite dyspneic with exertion.  Assessment/Plan: Principal Problem:   Acute respiratory failure due to COVID-19 [facility]) Active Problems:   Hypothyroidism   HYPERTENSION, BENIGN ESSENTIAL   Cigarette smoker   Acute hypoxemic respiratory failure due to COVID-19 [facility])  Acute hypoxic respiratory failure secondary to COVID-19 viral pneumonia, POA Presented with dyspnea and hypoxia, COVID-19 screening test positive on 11/26/2020.  Chest x-ray consistent with COVID-19 viral pneumonia. She is not on oxygen supplementation at baseline She is currently requiring 15 L to maintain O2 saturation greater than 90%.  Wean off as tolerated. She was started on remdesivir day 3, baricitinib day 2.   Switched Decadron daily to IV Solu-Medrol twice daily on 11/30/2020. Continue vitamin C, D3 and zinc. Continue bronchodilators Continue incentive spirometer and flutter valve Continue to pronate or side sleeping as tolerated Continue to mobilize as tolerated Continue to maintain O2 saturation  greater than 92%.  COVID-19 viral pneumonia, POA Independently reviewed chest x-ray done on admission which showed bilateral pulmonary infiltrates consistent with COVID-19 viral pneumonia Trend inflammatory markers, markedly elevated Rest of management as stated above.  Markedly elevated D-dimer D-dimer greater than 20 Ordered stat CTA chest to rule out PE, follow results Obtain bilateral lower extremity duplex ultrasound to rule out DVT, follow results  Steroid-induced hyperglycemia Hemoglobin A1c 4.9 on 11/30/2020 Continue insulin coverage.  Elevated liver chemistries Elevated AST, downtrending, closely monitor Avoid hepatotoxic agents Repeat CMP in the morning  Hypothyroidism Continue home levothyroxine  Essential hypertension BP is currently at goal Hold off home HCTZ due to soft blood pressures Continue to monitor vital signs  Peripheral neuropathy Continue home regimen.  GERD Continue home regimen.  Generalized weakness PT to assess Mobilize as tolerated Out of bed to chair with every shift Fall precautions   Code Status: Full code.  Family Communication: None at bedside.  Disposition Plan: Likely will discharge to home in the next 72 hours once oxygen requirement is less than 6 L.   Consultants:  None.  Procedures:  None.  Antimicrobials:  None.  DVT prophylaxis: Subcu Lovenox daily  Status is: Inpatient    Dispo:  Patient From: Home  Planned Disposition: Home  Expected discharge date: 12/04/2020  Medically stable for discharge: No, ongoing management of acute hypoxic respiratory failure secondary to COVID-19 viral pneumonia, POA.         Objective: Vitals:   12/01/20 1200 12/01/20 1215 12/01/20 1419 12/01/20 1426  BP: 109/76  102/78 102/78  Pulse:  85 85 90  Resp: (!) 32 (!) 23 (!) 22 19  Temp:  98.3 F (36.8 C) 98.3 F (36.8 C)  TempSrc:   Oral Oral  SpO2: (!) 88% 91% 92% 92%  Weight:    75.9 kg  Height:    5\' 7"   (1.702 m)    Intake/Output Summary (Last 24 hours) at 12/01/2020 1516 Last data filed at 12/01/2020 0427 Gross per 24 hour  Intake 250 ml  Output 1200 ml  Net -950 ml   Filed Weights   12/10/2020 1935 12/01/20 1426  Weight: 82.6 kg 75.9 kg    Exam:  . General: 69 y.o. year-old female ill-appearing.  Alert and oriented x3..  . Cardiovascular: Regular rate and rhythm no rubs or gallops. Marland Kitchen Respiratory: Mild diffuse rales bilaterally.  No wheezing noted.  Poor inspiratory effort. . Abdomen: Soft nontender normal bowel sounds present.  . Musculoskeletal: No lower extremity edema bilaterally.   . Skin: No ulcerative lesions noted. Marland Kitchen Psychiatry: Mood is appropriate for condition and setting.   Data Reviewed: CBC: Recent Labs  Lab 11/26/20 1736 12/04/2020 1925 11/30/20 0521 12/01/20 1300  WBC 6.9 7.5 9.4 11.8*  NEUTROABS  --  6.1 7.4 8.3*  HGB 12.3 12.8 12.3 11.8*  HCT 36.6 37.6 37.3 35.9*  MCV 108.3* 105.9* 106.9* 108.8*  PLT 128* 135* 143* [id number]   Basic Metabolic Panel: Recent Labs  Lab 11/26/20 1736 11/22/2020 1925 11/30/20 0521 12/01/20 1300  NA 137 142 143 143  K 3.9 3.7 4.1 4.2  CL 105 110 109 107  CO2 22 20* 21* 23  GLUCOSE 95 156* 200* 152*  BUN 23 16 16  28*  CREATININE 1.09* 0.90 0.95 0.92  CALCIUM 9.1 8.6* 8.5* 8.6*  MG  --   --  2.1 2.3  PHOS  --   --  2.9 2.4*   GFR: Estimated Creatinine Clearance: 62.2 mL/min (by C-G formula based on SCr of 0.92 mg/dL). Liver Function Tests: Recent Labs  Lab 11/30/2020 1925 11/30/20 0521 12/01/20 1300  AST 132* 152* 128*  ALT 29 33 35  ALKPHOS 102 114 110  BILITOT 0.7 0.5 1.1  PROT 7.4 7.7 7.2  ALBUMIN 3.5 3.5 3.2*   No results for input(s): LIPASE, AMYLASE in the last 168 hours. No results for input(s): AMMONIA in the last 168 hours. Coagulation Profile: No results for input(s): INR, PROTIME in the last 168 hours. Cardiac Enzymes: No results for input(s): CKTOTAL, CKMB, CKMBINDEX, TROPONINI in the last 168  hours. BNP (last 3 results) No results for input(s): PROBNP in the last 8760 hours. HbA1C: Recent Labs    11/30/20 0521  HGBA1C 4.9   CBG: Recent Labs  Lab 11/30/20 0913 11/30/20 1203 11/30/20 1656 12/01/20 0926  GLUCAP 162* 138* 158* 156*   Lipid Profile: Recent Labs    11/28/2020 1925  TRIG 221*   Thyroid Function Tests: No results for input(s): TSH, T4TOTAL, FREET4, T3FREE, THYROIDAB in the last 72 hours. Anemia Panel: Recent Labs    11/30/20 0521 12/01/20 1300  FERRITIN >7,500* >7,500*   Urine analysis:    Component Value Date/Time   COLORURINE YELLOW 11/26/2020 1942   APPEARANCEUR HAZY (A) 11/26/2020 1942   LABSPEC 1.025 11/26/2020 1942   PHURINE 5.0 11/26/2020 1942   GLUCOSEU NEGATIVE 11/26/2020 1942   HGBUR NEGATIVE 11/26/2020 1942   HGBUR negative 02/26/2010 1140   BILIRUBINUR NEGATIVE 11/26/2020 1942   KETONESUR NEGATIVE 11/26/2020 1942   PROTEINUR 30 (A) 11/26/2020 1942   UROBILINOGEN 0.2 01/26/2015 0016   NITRITE NEGATIVE 11/26/2020 1942   LEUKOCYTESUR NEGATIVE 11/26/2020 1942  Sepsis Labs: @LABRCNTIP (procalcitonin:4,lacticidven:4)  ) Recent Results (from the past 240 hour(s))  Novel Coronavirus, NAA ([facility])     Status: Abnormal   Collection Time: 11/24/20  6:09 PM   Specimen: Nasopharyngeal Swab; Nasopharyngeal(NP) swabs in vial transport medium   Nasopharynge  Result Value Ref Range Status   SARS-CoV-2, NAA Detected (A) Not Detected Final    Comment: Patients who have a positive COVID-19 test result may now have treatment options. Treatment options are available for patients with mild to moderate symptoms and for hospitalized patients. Visit our website at [email or web address] for resources and information. This nucleic acid amplification test was developed and its performance characteristics determined by [employer]. Nucleic acid amplification tests include RT-PCR and TMA. This test has not been FDA cleared or  approved. This test has been authorized by FDA under an Emergency Use Authorization (EUA). This test is only authorized for the duration of time the declaration that circumstances exist justifying the authorization of the emergency use of in vitro diagnostic tests for detection of SARS-CoV-2 virus and/or diagnosis of COVID-19 infection under section 564(b)(1) of the Act, 21 U.S.C. [id number]) (1), unless the authorization is terminated or revoked sooner. When diagnostic testing is negativ e, the possibility of a false negative result should be considered in the context of a patient's recent exposures and the presence of clinical signs and symptoms consistent with COVID-19. An individual without symptoms of COVID-19 and who is not shedding SARS-CoV-2 virus would expect to have a negative (not detected) result in this assay.   SARS-COV-2, NAA 2 DAY TAT     Status: None   Collection Time: 11/24/20  6:09 PM   Nasopharynge  Result Value Ref Range Status   SARS-CoV-2, NAA 2 DAY TAT Performed  Final  Resp Panel by RT-PCR (Flu A&B, Covid) Nasopharyngeal Swab     Status: Abnormal   Collection Time: 11/26/20  7:50 PM   Specimen: Nasopharyngeal Swab; Nasopharyngeal(NP) swabs in vial transport medium  Result Value Ref Range Status   SARS Coronavirus 2 by RT PCR POSITIVE (A) NEGATIVE Final    Comment: RESULT CALLED TO, READ BACK BY AND VERIFIED WITH: NASH J. 12.16.21 @ 2154 BY MECIAL J. (NOTE) SARS-CoV-2 target nucleic acids are DETECTED.  The SARS-CoV-2 RNA is generally detectable in upper respiratory specimens during the acute phase of infection. Positive results are indicative of the presence of the identified virus, but do not rule out bacterial infection or co-infection with other pathogens not detected by the test. Clinical correlation with patient history and other diagnostic information is necessary to determine patient infection status. The expected result is Negative.  Fact Sheet  for Patients: EntrepreneurPulse.com.au  Fact Sheet for Healthcare Providers: IncredibleEmployment.be  This test is not yet approved or cleared by the [place] FDA and  has been authorized for detection and/or diagnosis of SARS-CoV-2 by FDA under an Emergency Use Authorization (EUA).  This EUA will remain in effect (meaning this test can  be used) for the duration of  the COVID-19 declaration under Section 564(b)(1) of the Act, 21 U.S.C. section 360bbb-3(b)(1), unless the authorization is terminated or revoked sooner.     Influenza A by PCR NEGATIVE NEGATIVE Final   Influenza B by PCR NEGATIVE NEGATIVE Final    Comment: (NOTE) The Xpert Xpress SARS-CoV-2/FLU/RSV plus assay is intended as an aid in the diagnosis of influenza from Nasopharyngeal swab specimens and should not be used as a sole basis for treatment. Nasal washings and aspirates are unacceptable for Xpert  Xpress SARS-CoV-2/FLU/RSV testing.  Fact Sheet for Patients: EntrepreneurPulse.com.au  Fact Sheet for Healthcare Providers: IncredibleEmployment.be  This test is not yet approved or cleared by the [place] FDA and has been authorized for detection and/or diagnosis of SARS-CoV-2 by FDA under an Emergency Use Authorization (EUA). This EUA will remain in effect (meaning this test can be used) for the duration of the COVID-19 declaration under Section 564(b)(1) of the Act, 21 U.S.C. section 360bbb-3(b)(1), unless the authorization is terminated or revoked.  Performed at [facility], [place] [address] [place], [place] [address]   Blood Culture (routine x 2)     Status: None (Preliminary result)   Collection Time: 11/26/2020  7:44 PM   Specimen: BLOOD  Result Value Ref Range Status   Specimen Description   Final    BLOOD BLOOD RIGHT FOREARM Performed at [place] [address], [place],  [place] [address]    Special Requests   Final    BOTTLES DRAWN AEROBIC AND ANAEROBIC Blood Culture results may not be optimal due to an excessive volume of blood received in culture bottles Performed at [place] [address], [place], [place] [address]    Culture   Final    NO GROWTH 1 DAY Performed at [facility], [place] [address], [place], [place] [address]    Report Status PENDING  Incomplete  Blood Culture (routine x 2)     Status: Abnormal   Collection Time: 11/14/2020 10:11 PM   Specimen: BLOOD  Result Value Ref Range Status   Specimen Description   Final    BLOOD RIGHT ANTECUBITAL Performed at [place] [address] [place], [place] [address]    Special Requests   Final    BOTTLES DRAWN AEROBIC AND ANAEROBIC Blood Culture adequate volume Performed at [place] [address] [place], [place] [address]    Culture  Setup Time   Final    GRAM POSITIVE COCCI IN CLUSTERS AEROBIC BOTTLE ONLY Organism ID to follow CRITICAL RESULT CALLED TO, READ BACK BY AND VERIFIED WITH: Sheffield Slider [facility] 11/30/20 2329 JDW    Culture (A)  Final    STAPHYLOCOCCUS HOMINIS THE SIGNIFICANCE OF ISOLATING THIS ORGANISM FROM A SINGLE SET OF BLOOD CULTURES WHEN MULTIPLE SETS ARE DRAWN IS UNCERTAIN. PLEASE NOTIFY THE MICROBIOLOGY DEPARTMENT WITHIN ONE WEEK IF SPECIATION AND SENSITIVITIES ARE REQUIRED. Performed at [facility], [place] [address], [place], [place] [address]    Report Status 12/01/2020 FINAL  Final  Blood Culture ID Panel (Reflexed)     Status: Abnormal   Collection Time: 11/11/2020 10:11 PM  Result Value Ref Range Status   Enterococcus faecalis NOT DETECTED NOT DETECTED Final   Enterococcus Faecium NOT DETECTED NOT DETECTED Final   Listeria monocytogenes NOT DETECTED NOT DETECTED Final   Staphylococcus species DETECTED (A) NOT DETECTED Final    Comment: CRITICAL RESULT CALLED TO, READ BACK BY AND VERIFIED  WITH: Sheffield Slider [facility] 11/30/20 2329 JDW    Staphylococcus aureus (BCID) NOT DETECTED NOT DETECTED Final   Staphylococcus epidermidis NOT DETECTED NOT DETECTED Final   Staphylococcus lugdunensis NOT DETECTED NOT DETECTED Final   Streptococcus species NOT DETECTED NOT DETECTED Final   Streptococcus agalactiae NOT DETECTED NOT DETECTED Final   Streptococcus pneumoniae NOT DETECTED NOT DETECTED Final   Streptococcus pyogenes NOT DETECTED NOT DETECTED Final   A.calcoaceticus-baumannii NOT DETECTED NOT DETECTED Final   Bacteroides fragilis NOT DETECTED NOT  DETECTED Final   Enterobacterales NOT DETECTED NOT DETECTED Final   Enterobacter cloacae complex NOT DETECTED NOT DETECTED Final   Escherichia coli NOT DETECTED NOT DETECTED Final   Klebsiella aerogenes NOT DETECTED NOT DETECTED Final   Klebsiella oxytoca NOT DETECTED NOT DETECTED Final   Klebsiella pneumoniae NOT DETECTED NOT DETECTED Final   Proteus species NOT DETECTED NOT DETECTED Final   Salmonella species NOT DETECTED NOT DETECTED Final   Serratia marcescens NOT DETECTED NOT DETECTED Final   Haemophilus influenzae NOT DETECTED NOT DETECTED Final   Neisseria meningitidis NOT DETECTED NOT DETECTED Final   Pseudomonas aeruginosa NOT DETECTED NOT DETECTED Final   Stenotrophomonas maltophilia NOT DETECTED NOT DETECTED Final   Candida albicans NOT DETECTED NOT DETECTED Final   Candida auris NOT DETECTED NOT DETECTED Final   Candida glabrata NOT DETECTED NOT DETECTED Final   Candida krusei NOT DETECTED NOT DETECTED Final   Candida parapsilosis NOT DETECTED NOT DETECTED Final   Candida tropicalis NOT DETECTED NOT DETECTED Final   Cryptococcus neoformans/gattii NOT DETECTED NOT DETECTED Final    Comment: Performed at [facility], [place] [address] [place], [place] [address]      Studies: No results found.  Scheduled Meds: . vitamin C  500 mg Oral Daily  . baricitinib  4 mg Oral Daily  . cholecalciferol  1,000 Units Oral  Daily  . enoxaparin (LOVENOX) injection  40 mg Subcutaneous Q24H  . famotidine  20 mg Oral QHS  . fluticasone  1 spray Each Nare Daily  . gabapentin  300 mg Oral TID  . insulin aspart  0-9 Units Subcutaneous TID WC  . Ipratropium-Albuterol  1 puff Inhalation Q6H  . levothyroxine  125 mcg Oral Q0600  . loratadine  10 mg Oral Daily  . methylPREDNISolone (SOLU-MEDROL) injection  1 mg/kg Intravenous Q12H   Followed by  . [START ON 12/03/2020] predniSONE  50 mg Oral Daily  . mometasone-formoterol  2 puff Inhalation BID  . vitamin B-12  100 mcg Oral Daily  . zinc sulfate  220 mg Oral Daily    Continuous Infusions: . azithromycin Stopped (12/01/20 0425)  . remdesivir 100 mg in NS 100 mL 100 mg (12/01/20 0959)     LOS: 2 days     Kayleen Memos, MD [facility] Pager [phone]  If 7PM-7AM, please contact night-coverage [email or web address] Password [facility] 12/01/2020, 3:16 PM

## 2020-12-01 NOTE — ED Notes (Signed)
ABX complete,pt sleeping at bedside. Easily awakened. Pt has been given warm blankets for comfort. 129ml of Urine dumped.

## 2020-12-01 NOTE — ED Notes (Signed)
Pt has been medicated per MD orders. Pt tolerated well. Sleeping at bedside. Pt agitated and wants to go to room charge nurse made aware. On monitor x4. Will continue to monitor.

## 2020-12-01 NOTE — Plan of Care (Signed)
initiated

## 2020-12-01 NOTE — Evaluation (Signed)
Physical Therapy Evaluation Patient Details Name: [name] MRN: [id number] DOB: [birth year] Today's Date: 12/01/2020   History of Present Illness  Patient is unvaccinated 69 y.o. female with PMH significant for hypothyroidism, HTN ,GERD, OA, depression, and tobacco use. Patient presented to [facility] on 12/06/2020 with worsening SOB, vomiting, fever, and Sats in 80's on RA. Pt diagnosed with COVID-19 11/24/20.    Clinical Impression  [name] is 69 y.o. female admitted with above HPI and diagnosis. Patient is currently limited by functional impairments below (see PT problem list). Patient lives alone and is independent at baseline. Patient currently requries 15L/min for mobility and Sats remained >93% with short gait in room. Patient is steady for sit<>stand and stand step transfers and encouraged pt to mobilize bed<>chair for increased mobility daily. Educated on benefits of self pronning and sidelying to improve breathing. Educated on pursed lip breathing to recover from SOB with gait. Patient will benefit from continued skilled PT interventions to address impairments and progress independence with mobility, recommending OPPT vs no follow up pending pt's functional level at discharge. Acute PT will follow and progress as able.     Follow Up Recommendations No PT follow up;Outpatient PT (no PT follow up vs OPPT for balance and strength pending functional status at discharge)    Equipment Recommendations  None recommended by PT    Recommendations for Other Services       Precautions / Restrictions Precautions Precautions: Fall Precaution Comments: monitor O2 sats Restrictions Weight Bearing Restrictions: No      Mobility  Bed Mobility Overal bed mobility: Modified Independent             General bed mobility comments: pt sitting EOB, ended in chair.    Transfers Overall transfer level: Needs assistance Equipment used: None Transfers: Sit to/from [employer] Sit to Stand: Supervision Stand pivot transfers: Supervision       General transfer comment: no assist required, pt using hand on armrest to steady self and move bed>chair.  Ambulation/Gait Ambulation/Gait assistance: Min assist Gait Distance (Feet): 30 Feet Assistive device: 1 person hand held assist Gait Pattern/deviations: Step-through pattern;Decreased stride length Gait velocity: decr   General Gait Details: 1HHA provided to steady pt with gait. no overt LOB however pt with guarded gait. pt on 15L/min and SpO2 93% or greater. HR max of 124 bpm and recovered to 90's after sitting to rest. pt reports 3/4 for SOB.  Stairs            Wheelchair Mobility    Modified Rankin (Stroke Patients Only)       Balance Overall balance assessment: Mild deficits observed, not formally tested;Needs assistance Sitting-balance support: Feet supported Sitting balance-Leahy Scale: Good     Standing balance support: Single extremity supported;During functional activity;No upper extremity supported Standing balance-Leahy Scale: Good                               Pertinent Vitals/Pain Pain Assessment: No/denies pain    Home Living Family/patient expects to be discharged to:: Private residence Living Arrangements: Alone Available Help at Discharge: Family (sister and father) Type of Home: Apartment Home Access: Level entry     Home Layout: One level Home Equipment: None      Prior Function Level of Independence: Independent         Comments: pt Independent with all mobility and ADL's, does her own grocery shopping, cooking, cleaning, driving.  Hand Dominance   Dominant Hand: Right    Extremity/Trunk Assessment   Upper Extremity Assessment Upper Extremity Assessment: Overall WFL for tasks assessed    Lower Extremity Assessment Lower Extremity Assessment: Overall WFL for tasks assessed (5xSit<>Stand = pt using UE's 27.55 seconds.)     Cervical / Trunk Assessment Cervical / Trunk Assessment: Normal  Communication   Communication: No difficulties  Cognition Arousal/Alertness: Awake/alert Behavior During Therapy: WFL for tasks assessed/performed Overall Cognitive Status: Within Functional Limits for tasks assessed                                        General Comments      Exercises     Assessment/Plan    PT Assessment Patient needs continued PT services  PT Problem List Decreased strength;Decreased activity tolerance;Decreased mobility;Decreased balance;Decreased knowledge of use of DME;Decreased knowledge of precautions;Cardiopulmonary status limiting activity       PT Treatment Interventions Gait training;DME instruction;Functional mobility training;Therapeutic activities;Therapeutic exercise;Balance training;Patient/family education    PT Goals (Current goals can be found in the Care Plan section)  Acute Rehab PT Goals Patient Stated Goal: not be so SOB and keep strength PT Goal Formulation: With patient Time For Goal Achievement: 12/15/20 Potential to Achieve Goals: Good    Frequency Min 3X/week   Barriers to discharge        Co-evaluation               AM-PAC PT "6 Clicks" Mobility  Outcome Measure Help needed turning from your back to your side while in a flat bed without using bedrails?: None Help needed moving from lying on your back to sitting on the side of a flat bed without using bedrails?: None Help needed moving to and from a bed to a chair (including a wheelchair)?: A Little Help needed standing up from a chair using your arms (e.g., wheelchair or bedside chair)?: A Little Help needed to walk in hospital room?: A Little Help needed climbing 3-5 steps with a railing? : A Little 6 Click Score: 20    End of Session Equipment Utilized During Treatment: Oxygen Activity Tolerance: Patient tolerated treatment well Patient left: in chair;with call bell/phone  within reach Nurse Communication: Mobility status PT Visit Diagnosis: Muscle weakness (generalized) (M62.81);Difficulty in walking, not elsewhere classified (R26.2);Other abnormalities of gait and mobility (R26.89)    Time: 1224-8250 PT Time Calculation (min) (ACUTE ONLY): 22 min   Charges:   PT Evaluation $PT Eval Low Complexity: 1 Low          Verner Mould, DPT Acute Rehabilitation Services Office [phone] Pager [phone]    Jacques Navy 12/01/2020, 6:35 PM

## 2020-12-02 ENCOUNTER — Inpatient Hospital Stay (HOSPITAL_COMMUNITY): Payer: Medicare Other

## 2020-12-02 DIAGNOSIS — R7989 Other specified abnormal findings of blood chemistry: Secondary | ICD-10-CM

## 2020-12-02 LAB — CBC WITH DIFFERENTIAL/PLATELET
Abs Immature Granulocytes: 0.2 10*3/uL — ABNORMAL HIGH (ref 0.00–0.07)
Basophils Absolute: 0.1 10*3/uL (ref 0.0–0.1)
Basophils Relative: 1 %
Eosinophils Absolute: 0 10*3/uL (ref 0.0–0.5)
Eosinophils Relative: 0 %
HCT: 35.4 % — ABNORMAL LOW (ref 36.0–46.0)
Hemoglobin: 11.6 g/dL — ABNORMAL LOW (ref 12.0–15.0)
Immature Granulocytes: 2 %
Lymphocytes Relative: 22 %
Lymphs Abs: 2.8 10*3/uL (ref 0.7–4.0)
MCH: 35.4 pg — ABNORMAL HIGH (ref 26.0–34.0)
MCHC: 32.8 g/dL (ref 30.0–36.0)
MCV: 107.9 fL — ABNORMAL HIGH (ref 80.0–100.0)
Monocytes Absolute: 1.6 10*3/uL — ABNORMAL HIGH (ref 0.1–1.0)
Monocytes Relative: 12 %
Neutro Abs: 8.3 10*3/uL — ABNORMAL HIGH (ref 1.7–7.7)
Neutrophils Relative %: 63 %
Platelets: 167 10*3/uL (ref 150–400)
RBC: 3.28 MIL/uL — ABNORMAL LOW (ref 3.87–5.11)
RDW: 12.8 % (ref 11.5–15.5)
WBC: 12.9 10*3/uL — ABNORMAL HIGH (ref 4.0–10.5)
nRBC: 1.2 % — ABNORMAL HIGH (ref 0.0–0.2)

## 2020-12-02 LAB — COMPREHENSIVE METABOLIC PANEL
ALT: 31 U/L (ref 0–44)
AST: 84 U/L — ABNORMAL HIGH (ref 15–41)
Albumin: 3.2 g/dL — ABNORMAL LOW (ref 3.5–5.0)
Alkaline Phosphatase: 113 U/L (ref 38–126)
Anion gap: 13 (ref 5–15)
BUN: 35 mg/dL — ABNORMAL HIGH (ref 8–23)
CO2: 22 mmol/L (ref 22–32)
Calcium: 8.9 mg/dL (ref 8.9–10.3)
Chloride: 107 mmol/L (ref 98–111)
Creatinine, Ser: 0.97 mg/dL (ref 0.44–1.00)
GFR, Estimated: >60 mL/min (ref >60)
Glucose, Bld: 168 mg/dL — ABNORMAL HIGH (ref 70–99)
Potassium: 4.3 mmol/L (ref 3.5–5.1)
Sodium: 142 mmol/L (ref 135–145)
Total Bilirubin: 0.9 mg/dL (ref 0.3–1.2)
Total Protein: 7 g/dL (ref 6.5–8.1)

## 2020-12-02 LAB — MAGNESIUM: Magnesium: 2.5 mg/dL — ABNORMAL HIGH (ref 1.7–2.4)

## 2020-12-02 LAB — PHOSPHORUS: Phosphorus: 3.9 mg/dL (ref 2.5–4.6)

## 2020-12-02 LAB — GLUCOSE, CAPILLARY
Glucose-Capillary: 149 mg/dL — ABNORMAL HIGH (ref 70–99)
Glucose-Capillary: 184 mg/dL — ABNORMAL HIGH (ref 70–99)
Glucose-Capillary: 165 mg/dL — ABNORMAL HIGH (ref 70–99)

## 2020-12-02 LAB — C-REACTIVE PROTEIN: CRP: 3.9 mg/dL — ABNORMAL HIGH (ref <1.0)

## 2020-12-02 LAB — FERRITIN: Ferritin: >7500 ng/mL — ABNORMAL HIGH (ref 11–307)

## 2020-12-02 LAB — D-DIMER, QUANTITATIVE: D-Dimer, Quant: >20 ug/mL-FEU — ABNORMAL HIGH (ref 0.00–0.50)

## 2020-12-02 NOTE — Progress Notes (Signed)
[facility] Progress Note  Patient: [name]    GYI:[id number]  DOA: 17-Dec-2020     Date of Service: the patient was seen and examined on 12/02/2020  Brief hospital course: Past medical history of hypothyroidism, HTN, GERD, depression, osteoarthritis, active smoker.  Presents with complaints of cough and shortness of breath.  Found to have COVID-19 pneumonia. Currently plan is continue current supportive care for hypoxia.  Assessment and Plan: 1.  Acute hypoxic respiratory failure, POA secondary to COVID-19 pneumonia. Acute COVID-19 Viral Pneumonia CXR: hazy bilateral peripheral opacities CT chest: GGO, consolidation Oxygen requirement: On 11 LPM CRP: 3.9, ferritin still elevated.,  D-dimer still more than 20 Remdesivir: Day 4 Steroids: On Solu-Medrol 82 mg twice daily Baricitinib/Actemra(off-label use): Baricitinib initiated on 12/20 The investigational nature of this medication was discussed with the patient/HCPOA and they choose to proceed as the potential benefits are felt to outweigh risks at this time.  Antibiotics: Currently not indicated Vitamin C and Zinc: Continue DVT Prophylaxis: enoxaparin (LOVENOX) injection 40 mg Start: 11/30/20 0600 Prone positioning and incentive spirometer use recommended.  The treatment plan and use of medications and known side effects were discussed with patient/family. It was clearly explained that Complete risks and long-term side effects are unknown, however in the best clinical judgment they seem to be of some clinical benefit rather than medical risks. Patient/family agree with the treatment plan and want to receive these treatments as indicated.   2.  D-dimer elevation. CT PE negative, lower extremity Doppler negative.  Continue to monitor.  3.  Elevated LFT Secondary to COVID-19 illness. Monitor.  4.  Thrombocytopenia Currently improving. Secondary to COVID-19 pneumonia.  Monitor.  5.  Steroid-induced  hyperglycemia Continue sliding scale insulin.  6.  Hypothyroidism Continue Synthroid  7.  GERD Continue PPI  Body mass index is 26.21 kg/m.    Interventions:        Diet: Cardiac diet DVT Prophylaxis:   enoxaparin (LOVENOX) injection 40 mg Start: 11/30/20 0600    Advance goals of care discussion: Full code  Family Communication: no family was present at bedside, at the time of interview.  Unable to reach the family on call.  Disposition:  Status is: Inpatient  Remains inpatient appropriate because:IV treatments appropriate due to intensity of illness or inability to take PO   Dispo:  Patient From: Home  Planned Disposition: Home  Expected discharge date: 12/05/2020  Medically stable for discharge: No         Subjective: Continues to have shortness of breath.  Continues to have cough.  No nausea no vomiting.  No fever no chills.  Physical Exam:  General: Appear in mild distress, no Rash; Oral Mucosa Clear, moist. no Abnormal Neck Mass Or lumps, Conjunctiva normal  Cardiovascular: S1 and S2 Present, no Murmur, Respiratory: increased respiratory effort, Bilateral Air entry present and bilateral  Crackles, no wheezes Abdomen: Bowel Sound present, Soft and no tenderness Extremities: no Pedal edema Neurology: alert and oriented to time, place, and person affect appropriate. no new focal deficit Gait not checked due to patient safety concerns  Vitals:   12/02/20 0500 12/02/20 0600 12/02/20 1144 12/02/20 1244  BP:  100/67  113/86  Pulse:   83 92  Resp: (!) 21 20  17   Temp: 98.1 F (36.7 C)   (!) 97.4 F (36.3 C)  TempSrc: Oral   Oral  SpO2: 90%  90% (!) 89%  Weight:      Height:  Intake/Output Summary (Last 24 hours) at 12/02/2020 1943 Last data filed at 12/02/2020 0930 Gross per 24 hour  Intake 310 ml  Output --  Net 310 ml   Filed Weights   11/17/2020 1935 12/01/20 1426  Weight: 82.6 kg 75.9 kg    Data Reviewed: I have personally  reviewed and interpreted daily labs, tele strips, imagings as discussed above. I reviewed all nursing notes, pharmacy notes, vitals, pertinent old records I have discussed plan of care as described above with RN and patient/family.  CBC: Recent Labs  Lab 11/26/20 1736 11/28/2020 1925 11/30/20 0521 12/01/20 1300 12/02/20 0519  WBC 6.9 7.5 9.4 11.8* 12.9*  NEUTROABS  --  6.1 7.4 8.3* 8.3*  HGB 12.3 12.8 12.3 11.8* 11.6*  HCT 36.6 37.6 37.3 35.9* 35.4*  MCV 108.3* 105.9* 106.9* 108.8* 107.9*  PLT 128* 135* 143* 183 [id number]   Basic Metabolic Panel: Recent Labs  Lab 11/26/20 1736 11/14/2020 1925 11/30/20 0521 12/01/20 1300 12/02/20 0519  NA 137 142 143 143 142  K 3.9 3.7 4.1 4.2 4.3  CL 105 110 109 107 107  CO2 22 20* 21* 23 22  GLUCOSE 95 156* 200* 152* 168*  BUN 23 16 16  28* 35*  CREATININE 1.09* 0.90 0.95 0.92 0.97  CALCIUM 9.1 8.6* 8.5* 8.6* 8.9  MG  --   --  2.1 2.3 2.5*  PHOS  --   --  2.9 2.4* 3.9    Studies: VAS [place] LOWER EXTREMITY VENOUS (DVT)  Result Date: 12/02/2020  Lower Venous DVT Study Indications: Elevated ddimer.  Comparison Study: no prior Performing Technologist: Abram Sander RVS  Examination Guidelines: A complete evaluation includes B-mode imaging, spectral Doppler, color Doppler, and power Doppler as needed of all accessible portions of each vessel. Bilateral testing is considered an integral part of a complete examination. Limited examinations for reoccurring indications may be performed as noted. The reflux portion of the exam is performed with the patient in reverse Trendelenburg.  +---------+---------------+---------+-----------+----------+--------------+ RIGHT    CompressibilityPhasicitySpontaneityPropertiesThrombus Aging +---------+---------------+---------+-----------+----------+--------------+ CFV      Full           Yes      Yes                                 +---------+---------------+---------+-----------+----------+--------------+ SFJ       Full                                                        +---------+---------------+---------+-----------+----------+--------------+ FV Prox  Full                                                        +---------+---------------+---------+-----------+----------+--------------+ FV Mid   Full                                                        +---------+---------------+---------+-----------+----------+--------------+ FV DistalFull                                                        +---------+---------------+---------+-----------+----------+--------------+  PFV      Full                                                        +---------+---------------+---------+-----------+----------+--------------+ POP      Full           Yes      Yes                                 +---------+---------------+---------+-----------+----------+--------------+ PTV      Full                                                        +---------+---------------+---------+-----------+----------+--------------+ PERO     Full                                                        +---------+---------------+---------+-----------+----------+--------------+   +---------+---------------+---------+-----------+----------+--------------+ LEFT     CompressibilityPhasicitySpontaneityPropertiesThrombus Aging +---------+---------------+---------+-----------+----------+--------------+ CFV      Full           Yes      Yes                                 +---------+---------------+---------+-----------+----------+--------------+ SFJ      Full                                                        +---------+---------------+---------+-----------+----------+--------------+ FV Prox  Full                                                        +---------+---------------+---------+-----------+----------+--------------+ FV Mid   Full                                                         +---------+---------------+---------+-----------+----------+--------------+ FV DistalFull                                                        +---------+---------------+---------+-----------+----------+--------------+ PFV      Full                                                        +---------+---------------+---------+-----------+----------+--------------+  POP      Full           Yes      Yes                                 +---------+---------------+---------+-----------+----------+--------------+ PTV      Full                                                        +---------+---------------+---------+-----------+----------+--------------+ PERO     Full                                                        +---------+---------------+---------+-----------+----------+--------------+     Summary: BILATERAL: - No evidence of deep vein thrombosis seen in the lower extremities, bilaterally. - No evidence of superficial venous thrombosis in the lower extremities, bilaterally. -No evidence of popliteal cyst, bilaterally.   *See table(s) above for measurements and observations.    Preliminary     Scheduled Meds: . vitamin C  500 mg Oral Daily  . baricitinib  4 mg Oral Daily  . cholecalciferol  1,000 Units Oral Daily  . enoxaparin (LOVENOX) injection  40 mg Subcutaneous Q24H  . famotidine  20 mg Oral QHS  . fluticasone  1 spray Each Nare Daily  . gabapentin  300 mg Oral TID  . insulin aspart  0-9 Units Subcutaneous TID WC  . Ipratropium-Albuterol  1 puff Inhalation Q6H  . levothyroxine  125 mcg Oral Q0600  . loratadine  10 mg Oral Daily  . methylPREDNISolone (SOLU-MEDROL) injection  1 mg/kg Intravenous Q12H   Followed by  . [START ON 12/03/2020] predniSONE  50 mg Oral Daily  . mometasone-formoterol  2 puff Inhalation BID  . vitamin B-12  100 mcg Oral Daily  . zinc sulfate  220 mg Oral Daily   Continuous Infusions: . azithromycin 500 mg  (12/01/20 2229)  . remdesivir 100 mg in NS 100 mL Stopped (12/02/20 0900)   PRN Meds: acetaminophen, chlorpheniramine-HYDROcodone, guaiFENesin-dextromethorphan, ibuprofen, ondansetron **OR** ondansetron (ZOFRAN) IV  Time spent: 35 minutes  Author: Berle Mull, MD [facility] 12/02/2020 7:43 PM  To reach On-call, see care teams to locate the attending and reach out via [email or web address]. Between 7PM-7AM, please contact night-coverage If you still have difficulty reaching the attending provider, please page the [facility] (Director on Call) for [facility] on amion for assistance.

## 2020-12-02 NOTE — Progress Notes (Signed)
Physical Therapy Treatment Patient Details Name: [name] MRN: [id number] DOB: [birth year] Today's Date: 12/02/2020    History of Present Illness Patient is unvaccinated 69 y.o. female with PMH significant for hypothyroidism, HTN ,GERD, OA, depression, and tobacco use. Patient presented to [facility] on [facility]/18/2020 with worsening SOB, vomiting, fever, and Sats in 80's on RA. Pt diagnosed with COVID-19 11/24/20.    PT Comments    Patient progressing well with acute PT but remains limited by decreased activity tolerance. Patient was able to ambulate 2 bouts of ~ 20' with min assist to steady use of IV pole. Pt able to wean to 10L/min with gait and sats dropped to mid 80's and recovered to high 90's with ~5 minutes seated recovery. Pt instructed on use of flutter valve and encouraged to continue mobilizing bed<>chair and pronning in bed when resting.      Follow Up Recommendations  No PT follow up;Outpatient PT (no PT follow up vs OPPT for balance and strength pending functional status at discharge)     Equipment Recommendations  None recommended by PT    Recommendations for Other Services       Precautions / Restrictions Precautions Precautions: Fall Precaution Comments: monitor O2 sats Restrictions Weight Bearing Restrictions: No    Mobility  Bed Mobility Overal bed mobility: Modified Independent             General bed mobility comments: pt in recliner at start of session  Transfers Overall transfer level: Needs assistance Equipment used: None Transfers: Sit to/from [facility] Sit to Stand: Supervision Stand pivot transfers: Supervision       General transfer comment: min guard for sit<>stand initially from recliner and supervision for repeated stands at EOS. pt requires bil UE use for power up and use of UE's to control lowering to sit.  Ambulation/Gait Ambulation/Gait assistance: Min assist Gait Distance (Feet): 40 Feet (2x20) Assistive  device: IV Pole Gait Pattern/deviations: Step-through pattern;Decreased stride length Gait velocity: decr   General Gait Details: Cues for safe use of IV pole tos teady with gait, min assist to manage during turn. Pt requried seated rest between short gait bouts in room and ~5 minutes to recover from SOB. 1st bout pt on 15L/min and SpO2 remained 94% or better. Patient on 10L/min for second bout and sats dropped to 86% for a low and recovered to 94-100% with seated rest. HR max of 122 bpm and recovered to 80's-90's.   Stairs             Wheelchair Mobility    Modified Rankin (Stroke Patients Only)       Balance Overall balance assessment: Mild deficits observed, not formally tested;Needs assistance Sitting-balance support: Feet supported Sitting balance-Leahy Scale: Good     Standing balance support: Single extremity supported;During functional activity;No upper extremity supported Standing balance-Leahy Scale: Good                              Cognition Arousal/Alertness: Awake/alert Behavior During Therapy: WFL for tasks assessed/performed Overall Cognitive Status: Within Functional Limits for tasks assessed                                        Exercises      General Comments General comments (skin integrity, edema, etc.): 2x5 reps flutter valve for breathing exercises.  Pertinent Vitals/Pain Pain Assessment: No/denies pain    Home Living                      Prior Function            PT Goals (current goals can now be found in the care plan section) Acute Rehab PT Goals Patient Stated Goal: not be so SOB and keep strength PT Goal Formulation: With patient Time For Goal Achievement: 12/15/20 Potential to Achieve Goals: Good Progress towards PT goals: Progressing toward goals    Frequency    Min 3X/week      PT Plan Current plan remains appropriate    Co-evaluation              AM-PAC PT "6  Clicks" Mobility   Outcome Measure  Help needed turning from your back to your side while in a flat bed without using bedrails?: None Help needed moving from lying on your back to sitting on the side of a flat bed without using bedrails?: None Help needed moving to and from a bed to a chair (including a wheelchair)?: A Little Help needed standing up from a chair using your arms (e.g., wheelchair or bedside chair)?: A Little Help needed to walk in hospital room?: A Little Help needed climbing 3-5 steps with a railing? : A Little 6 Click Score: 20    End of Session Equipment Utilized During Treatment: Oxygen Activity Tolerance: Patient tolerated treatment well Patient left: in chair;with call bell/phone within reach Nurse Communication: Mobility status PT Visit Diagnosis: Muscle weakness (generalized) (M62.81);Difficulty in walking, not elsewhere classified (R26.2);Other abnormalities of gait and mobility (R26.89)     Time: 2542-7062 PT Time Calculation (min) (ACUTE ONLY): 29 min  Charges:  $Gait Training: 8-22 mins $Therapeutic Exercise: 8-22 mins                     Verner Mould, DPT Acute Rehabilitation Services Office [phone] Pager [phone]     Jacques Navy 12/02/2020, 1:30 PM

## 2020-12-02 NOTE — Care Management Important Message (Signed)
Important Message  Patient Details IM Letter given to the Patient. Name: [name] MRN: [id number] Date of Birth: [birth year]   Medicare Important Message Given:  Yes     Kerin Salen 12/02/2020, 11:38 AM

## 2020-12-02 NOTE — Progress Notes (Signed)
Lower extremity venous has been completed.   Preliminary results in CV Proc.   Abram Sander 12/02/2020 8:41 AM

## 2020-12-03 LAB — CBC WITH DIFFERENTIAL/PLATELET
Abs Immature Granulocytes: 0.56 K/uL — ABNORMAL HIGH (ref 0.00–0.07)
Basophils Absolute: 0 K/uL (ref 0.0–0.1)
Basophils Relative: 0 %
Eosinophils Absolute: 0 K/uL (ref 0.0–0.5)
Eosinophils Relative: 0 %
HCT: 37.8 % (ref 36.0–46.0)
Hemoglobin: 12.3 g/dL (ref 12.0–15.0)
Immature Granulocytes: 3 %
Lymphocytes Relative: 21 %
Lymphs Abs: 4.4 K/uL — ABNORMAL HIGH (ref 0.7–4.0)
MCH: 35.7 pg — ABNORMAL HIGH (ref 26.0–34.0)
MCHC: 32.5 g/dL (ref 30.0–36.0)
MCV: 109.6 fL — ABNORMAL HIGH (ref 80.0–100.0)
Monocytes Absolute: 2.8 K/uL — ABNORMAL HIGH (ref 0.1–1.0)
Monocytes Relative: 13 %
Neutro Abs: 13.2 K/uL — ABNORMAL HIGH (ref 1.7–7.7)
Neutrophils Relative %: 63 %
Platelets: 164 K/uL (ref 150–400)
RBC: 3.45 MIL/uL — ABNORMAL LOW (ref 3.87–5.11)
RDW: 12.7 % (ref 11.5–15.5)
WBC: 21 K/uL — ABNORMAL HIGH (ref 4.0–10.5)
nRBC: 1.2 % — ABNORMAL HIGH (ref 0.0–0.2)

## 2020-12-03 LAB — COMPREHENSIVE METABOLIC PANEL
ALT: 37 U/L (ref 0–44)
AST: 89 U/L — ABNORMAL HIGH (ref 15–41)
Albumin: 3.4 g/dL — ABNORMAL LOW (ref 3.5–5.0)
Alkaline Phosphatase: 121 U/L (ref 38–126)
Anion gap: 12 (ref 5–15)
BUN: 36 mg/dL — ABNORMAL HIGH (ref 8–23)
CO2: 22 mmol/L (ref 22–32)
Calcium: 9 mg/dL (ref 8.9–10.3)
Chloride: 111 mmol/L (ref 98–111)
Creatinine, Ser: 1.09 mg/dL — ABNORMAL HIGH (ref 0.44–1.00)
GFR, Estimated: 55 mL/min — ABNORMAL LOW (ref >60)
Glucose, Bld: 162 mg/dL — ABNORMAL HIGH (ref 70–99)
Potassium: 4.1 mmol/L (ref 3.5–5.1)
Sodium: 145 mmol/L (ref 135–145)
Total Bilirubin: 0.8 mg/dL (ref 0.3–1.2)
Total Protein: 7.3 g/dL (ref 6.5–8.1)

## 2020-12-03 LAB — MAGNESIUM: Magnesium: 2.6 mg/dL — ABNORMAL HIGH (ref 1.7–2.4)

## 2020-12-03 LAB — GLUCOSE, CAPILLARY
Glucose-Capillary: 149 mg/dL — ABNORMAL HIGH (ref 70–99)
Glucose-Capillary: 316 mg/dL — ABNORMAL HIGH (ref 70–99)
Glucose-Capillary: 131 mg/dL — ABNORMAL HIGH (ref 70–99)
Glucose-Capillary: 165 mg/dL — ABNORMAL HIGH (ref 70–99)

## 2020-12-03 LAB — C-REACTIVE PROTEIN: CRP: 1.9 mg/dL — ABNORMAL HIGH (ref <1.0)

## 2020-12-03 LAB — PHOSPHORUS: Phosphorus: 3.3 mg/dL (ref 2.5–4.6)

## 2020-12-03 LAB — FERRITIN: Ferritin: >7500 ng/mL — ABNORMAL HIGH (ref 11–307)

## 2020-12-03 LAB — D-DIMER, QUANTITATIVE: D-Dimer, Quant: >20 ug{FEU}/mL — ABNORMAL HIGH (ref 0.00–0.50)

## 2020-12-03 MED ORDER — METHYLPREDNISOLONE SODIUM SUCC 125 MG IJ SOLR
60.0000 mg | Freq: Two times a day (BID) | INTRAMUSCULAR | Status: DC
  Administered 2020-12-04 – 2020-12-09 (×11): 60 mg via INTRAVENOUS
  Filled 2020-12-03 (×11): qty 2

## 2020-12-03 MED ORDER — BARICITINIB 2 MG PO TABS
2.0000 mg | ORAL_TABLET | Freq: Every day | ORAL | Status: DC
  Administered 2020-12-04: 09:00:00 2 mg via ORAL
  Filled 2020-12-03: qty 1

## 2020-12-03 NOTE — Progress Notes (Signed)
[facility] Progress Note  Patient: [name]    VZD:[id number]  DOA: 2020/12/17     Date of Service: the patient was seen and examined on 12/03/2020  Brief hospital course: Past medical history of hypothyroidism, HTN, GERD, depression, osteoarthritis, active smoker.  Presents with complaints of cough and shortness of breath.  Found to have COVID-19 pneumonia. Currently plan is continue current supportive care for hypoxia.  Assessment and Plan: 1.  Acute hypoxic respiratory failure, POA secondary to COVID-19 pneumonia. Acute COVID-19 Viral Pneumonia CXR: hazy bilateral peripheral opacities CT chest: GGO, consolidation Oxygen requirement: 13 LPM, 92% saturation CRP: 1.9, ferritin still elevated.,  D-dimer still more than 20 Remdesivir: Completed 12/23 Steroids: On Solu-Medrol twice daily Baricitinib/Actemra(off-label use): Baricitinib initiated on 12/20 The investigational nature of this medication was discussed with the patient/HCPOA and they choose to proceed as the potential benefits are felt to outweigh risks at this time.  Antibiotics: Currently not indicated Vitamin C and Zinc: Continue DVT Prophylaxis: enoxaparin (LOVENOX) injection 40 mg Start: 11/30/20 0600 Prone positioning and incentive spirometer use recommended.  The treatment plan and use of medications and known side effects were discussed with patient/family. It was clearly explained that Complete risks and long-term side effects are unknown, however in the best clinical judgment they seem to be of some clinical benefit rather than medical risks. Patient/family agree with the treatment plan and want to receive these treatments as indicated.   2.  D-dimer elevation. CT PE negative, lower extremity Doppler negative.  Continue to monitor.  3.  Elevated LFT Secondary to COVID-19 illness. Monitor.  4.  Thrombocytopenia Currently improving. Secondary to COVID-19 pneumonia.  Monitor.  5.  Steroid-induced  hyperglycemia Continue sliding scale insulin.  6.  Hypothyroidism Continue Synthroid  7.  GERD Continue PPI  Body mass index is 26.21 kg/m.    Interventions:        Diet: Cardiac diet DVT Prophylaxis:   enoxaparin (LOVENOX) injection 40 mg Start: 11/30/20 0600    Advance goals of care discussion: Full code  Family Communication: no family was present at bedside, at the time of interview.  Discussed with daughter on the phone  Disposition:  Status is: Inpatient  Remains inpatient appropriate because:IV treatments appropriate due to intensity of illness or inability to take PO   Dispo:  Patient From: Home  Planned Disposition: Home  Expected discharge date: 12/05/2020  Medically stable for discharge: No         Subjective: Overnight oxygenation worsened currently mild improvement.  No nausea no vomiting.  Still short of breath.  No chest pain right now  Physical Exam:  General: Appear in moderate distress, no Rash; Oral Mucosa Clear, moist. no Abnormal Neck Mass Or lumps, Conjunctiva normal  Cardiovascular: S1 and S2 Present, no Murmur, Respiratory: increased respiratory effort, Bilateral Air entry present and bilateral  Crackles, no wheezes Abdomen: Bowel Sound present, Soft and no tenderness Extremities: no Pedal edema Neurology: alert and oriented to time, place, and person affect appropriate. no new focal deficit Gait not checked due to patient safety concerns  Vitals:   12/02/20 1244 12/02/20 2119 12/03/20 0501 12/03/20 1335  BP: 113/86 111/78 (!) 142/117 117/71  Pulse: 92 76 95 85  Resp: 17 20 (!) 21 20  Temp: (!) 97.4 F (36.3 C) 97.9 F (36.6 C) 98 F (36.7 C) (!) 97.2 F (36.2 C)  TempSrc: Oral Oral Oral Oral  SpO2: (!) 89% (!) 89% 95% 92%  Weight:  Height:        Intake/Output Summary (Last 24 hours) at 12/03/2020 1849 Last data filed at 12/03/2020 1336 Gross per 24 hour  Intake 600 ml  Output 700 ml  Net -100 ml   Filed  Weights   11/25/2020 1935 12/01/20 1426  Weight: 82.6 kg 75.9 kg    Data Reviewed: I have personally reviewed and interpreted daily labs, tele strips, imagings as discussed above. I reviewed all nursing notes, pharmacy notes, vitals, pertinent old records I have discussed plan of care as described above with RN and patient/family.  CBC: Recent Labs  Lab 12/09/2020 1925 11/30/20 0521 12/01/20 1300 12/02/20 0519 12/03/20 0513  WBC 7.5 9.4 11.8* 12.9* 21.0*  NEUTROABS 6.1 7.4 8.3* 8.3* 13.2*  HGB 12.8 12.3 11.8* 11.6* 12.3  HCT 37.6 37.3 35.9* 35.4* 37.8  MCV 105.9* 106.9* 108.8* 107.9* 109.6*  PLT 135* 143* 183 167 [id number]   Basic Metabolic Panel: Recent Labs  Lab 11/30/2020 1925 11/30/20 0521 12/01/20 1300 12/02/20 0519 12/03/20 0513  NA 142 143 143 142 145  K 3.7 4.1 4.2 4.3 4.1  CL 110 109 107 107 111  CO2 20* 21* 23 22 22   GLUCOSE 156* 200* 152* 168* 162*  BUN 16 16 28* 35* 36*  CREATININE 0.90 0.95 0.92 0.97 1.09*  CALCIUM 8.6* 8.5* 8.6* 8.9 9.0  MG  --  2.1 2.3 2.5* 2.6*  PHOS  --  2.9 2.4* 3.9 3.3    Studies: No results found.  Scheduled Meds: . vitamin C  500 mg Oral Daily  . [START ON 12/04/2020] baricitinib  2 mg Oral Daily  . cholecalciferol  1,000 Units Oral Daily  . enoxaparin (LOVENOX) injection  40 mg Subcutaneous Q24H  . famotidine  20 mg Oral QHS  . fluticasone  1 spray Each Nare Daily  . gabapentin  300 mg Oral TID  . insulin aspart  0-9 Units Subcutaneous TID WC  . Ipratropium-Albuterol  1 puff Inhalation Q6H  . levothyroxine  125 mcg Oral Q0600  . loratadine  10 mg Oral Daily  . methylPREDNISolone (SOLU-MEDROL) injection  60 mg Intravenous Q12H  . mometasone-formoterol  2 puff Inhalation BID  . vitamin B-12  100 mcg Oral Daily  . zinc sulfate  220 mg Oral Daily   Continuous Infusions:  PRN Meds: acetaminophen, chlorpheniramine-HYDROcodone, guaiFENesin-dextromethorphan, ondansetron **OR** ondansetron (ZOFRAN) IV  Time spent: 35  minutes  Author: Berle Mull, MD [facility] 12/03/2020 6:49 PM  To reach On-call, see care teams to locate the attending and reach out via [email or web address]. Between 7PM-7AM, please contact night-coverage If you still have difficulty reaching the attending provider, please page the [facility] (Director on Call) for [facility] on amion for assistance.

## 2020-12-03 NOTE — Progress Notes (Signed)
Physical Therapy Treatment Patient Details Name: [name] MRN: [id number] DOB: October 04, 1951 Today's Date: 12/03/2020    History of Present Illness Patient is unvaccinated 69 y.o. female with PMH significant for hypothyroidism, HTN ,GERD, OA, depression, and tobacco use. Patient presented to [facility] on 12/01/2020 with worsening SOB, vomiting, fever, and Sats in 80's on RA. Pt diagnosed with COVID-19 11/24/20.    PT Comments    Pt in bed room dark and lunch tray untouched.  Very limited session.  General transfer comment: Supervision for lines/leads/O2 tubing/covers otherwise self able.  Only tolerated sitting EOB x 7 min due to dyspnea,fatigue and dizziness.  General Gait Details: not able this session due to increased c/o fatigue and dizziness.  HR 128 and O2 sats 91% on 15 HFNC present with 3/4 dyspnea.  "I got to lay down, I'm sorry".  Follow Up Recommendations  No PT follow up;Outpatient PT (pending progress)     Equipment Recommendations  None recommended by PT    Recommendations for Other Services       Precautions / Restrictions Precautions Precautions: Fall Precaution Comments: monitor O2 sats Restrictions Weight Bearing Restrictions: No    Mobility  Bed Mobility Overal bed mobility: Modified Independent             General bed mobility comments: requires increased time  Transfers Overall transfer level: Needs assistance Equipment used: None Transfers: Sit to/from Stand;Stand Pivot Transfers Sit to Stand: Supervision Stand pivot transfers: Supervision       General transfer comment: Supervision for lines/leads/O2 tubing/covers otherwise self able.  Only tolerated sitting EOB x 7 min due to dyspnea,fatigue and dizziness  Ambulation/Gait             General Gait Details: not able this session due to increased c/o fatigue and dizziness.  HR 128 and O2 sats 91% on 15 HFNC present with 3/4 dyspnea.  "I got to lay down, I'm sorry".   Stairs              Wheelchair Mobility    Modified Rankin (Stroke Patients Only)       Balance                                            Cognition Arousal/Alertness: Awake/alert Behavior During Therapy: WFL for tasks assessed/performed Overall Cognitive Status: Within Functional Limits for tasks assessed                                 General Comments: AxO x 3 feeling "really bad"      Exercises      General Comments        Pertinent Vitals/Pain Pain Assessment: No/denies pain    Home Living                      Prior Function            PT Goals (current goals can now be found in the care plan section) Progress towards PT goals: Progressing toward goals    Frequency    Min 3X/week      PT Plan Current plan remains appropriate    Co-evaluation              AM-PAC PT "6 Clicks" Mobility   Outcome Measure  Help needed  turning from your back to your side while in a flat bed without using bedrails?: None Help needed moving from lying on your back to sitting on the side of a flat bed without using bedrails?: None Help needed moving to and from a bed to a chair (including a wheelchair)?: A Little Help needed standing up from a chair using your arms (e.g., wheelchair or bedside chair)?: A Little Help needed to walk in hospital room?: A Little Help needed climbing 3-5 steps with a railing? : A Lot 6 Click Score: 19    End of Session Equipment Utilized During Treatment: Oxygen Activity Tolerance: Treatment limited secondary to medical complications (Comment) Patient left: in bed;with call bell/phone within reach Nurse Communication: Mobility status PT Visit Diagnosis: Muscle weakness (generalized) (M62.81);Difficulty in walking, not elsewhere classified (R26.2);Other abnormalities of gait and mobility (R26.89)     Time: 1422-1440 PT Time Calculation (min) (ACUTE ONLY): 18 min  Charges:  $Therapeutic Activity: 8-22  mins                     Rica Koyanagi  PTA Acute  Rehabilitation Services Pager      [phone] Office      [phone]

## 2020-12-03 NOTE — TOC Progression Note (Signed)
Transition of Care [facility]) - Progression Note    Patient Details  Name: CELEST REITZ MRN: [id number] Date of Birth: Jan 27, 1951  [facility] of [facility] CM/SW Contact  Purcell Mouton, RN Phone Number: 12/03/2020, 3:45 PM  Clinical Narrative:     Pt plan to discharge home with no needs at present time.   Expected Discharge Plan: Home/Self Care Barriers to Discharge: No Barriers Identified  Expected Discharge Plan and Services Expected Discharge Plan: Home/Self Care       Living arrangements for the past 2 months: Single Family Home                                       Social Determinants of Health (SDOH) Interventions    Readmission Risk Interventions No flowsheet data found.

## 2020-12-04 ENCOUNTER — Inpatient Hospital Stay (HOSPITAL_COMMUNITY): Payer: Medicare Other

## 2020-12-04 LAB — CBC WITH DIFFERENTIAL/PLATELET
Abs Immature Granulocytes: 0.78 10*3/uL — ABNORMAL HIGH (ref 0.00–0.07)
Basophils Absolute: 0.1 10*3/uL (ref 0.0–0.1)
Basophils Relative: 1 %
Eosinophils Absolute: 0 10*3/uL (ref 0.0–0.5)
Eosinophils Relative: 0 %
HCT: 36 % (ref 36.0–46.0)
Hemoglobin: 11.8 g/dL — ABNORMAL LOW (ref 12.0–15.0)
Immature Granulocytes: 4 %
Lymphocytes Relative: 16 %
Lymphs Abs: 2.9 10*3/uL (ref 0.7–4.0)
MCH: 34.8 pg — ABNORMAL HIGH (ref 26.0–34.0)
MCHC: 32.8 g/dL (ref 30.0–36.0)
MCV: 106.2 fL — ABNORMAL HIGH (ref 80.0–100.0)
Monocytes Absolute: 2.2 10*3/uL — ABNORMAL HIGH (ref 0.1–1.0)
Monocytes Relative: 12 %
Neutro Abs: 12.7 10*3/uL — ABNORMAL HIGH (ref 1.7–7.7)
Neutrophils Relative %: 67 %
Platelets: 125 10*3/uL — ABNORMAL LOW (ref 150–400)
RBC: 3.39 MIL/uL — ABNORMAL LOW (ref 3.87–5.11)
RDW: 12.8 % (ref 11.5–15.5)
WBC: 18.7 10*3/uL — ABNORMAL HIGH (ref 4.0–10.5)
nRBC: 0.8 % — ABNORMAL HIGH (ref 0.0–0.2)

## 2020-12-04 LAB — GLUCOSE, CAPILLARY
Glucose-Capillary: 152 mg/dL — ABNORMAL HIGH (ref 70–99)
Glucose-Capillary: 177 mg/dL — ABNORMAL HIGH (ref 70–99)
Glucose-Capillary: 263 mg/dL — ABNORMAL HIGH (ref 70–99)

## 2020-12-04 LAB — COMPREHENSIVE METABOLIC PANEL
ALT: 40 U/L (ref 0–44)
AST: 70 U/L — ABNORMAL HIGH (ref 15–41)
Albumin: 3.3 g/dL — ABNORMAL LOW (ref 3.5–5.0)
Alkaline Phosphatase: 117 U/L (ref 38–126)
Anion gap: 12 (ref 5–15)
BUN: 36 mg/dL — ABNORMAL HIGH (ref 8–23)
CO2: 22 mmol/L (ref 22–32)
Calcium: 9.1 mg/dL (ref 8.9–10.3)
Chloride: 112 mmol/L — ABNORMAL HIGH (ref 98–111)
Creatinine, Ser: 0.99 mg/dL (ref 0.44–1.00)
GFR, Estimated: >60 mL/min (ref >60)
Glucose, Bld: 153 mg/dL — ABNORMAL HIGH (ref 70–99)
Potassium: 4 mmol/L (ref 3.5–5.1)
Sodium: 146 mmol/L — ABNORMAL HIGH (ref 135–145)
Total Bilirubin: 1.1 mg/dL (ref 0.3–1.2)
Total Protein: 6.8 g/dL (ref 6.5–8.1)

## 2020-12-04 LAB — D-DIMER, QUANTITATIVE: D-Dimer, Quant: >20 ug/mL-FEU — ABNORMAL HIGH (ref 0.00–0.50)

## 2020-12-04 LAB — PHOSPHORUS: Phosphorus: 3.2 mg/dL (ref 2.5–4.6)

## 2020-12-04 LAB — FERRITIN: Ferritin: 5363 ng/mL — ABNORMAL HIGH (ref 11–307)

## 2020-12-04 LAB — MAGNESIUM: Magnesium: 2.6 mg/dL — ABNORMAL HIGH (ref 1.7–2.4)

## 2020-12-04 LAB — C-REACTIVE PROTEIN: CRP: 1.2 mg/dL — ABNORMAL HIGH (ref <1.0)

## 2020-12-04 MED ORDER — BARICITINIB 2 MG PO TABS
2.0000 mg | ORAL_TABLET | Freq: Once | ORAL | Status: AC
  Administered 2020-12-04: 16:00:00 2 mg via ORAL
  Filled 2020-12-04: qty 1

## 2020-12-04 MED ORDER — BARICITINIB 2 MG PO TABS
4.0000 mg | ORAL_TABLET | Freq: Every day | ORAL | Status: DC
  Administered 2020-12-05 – 2020-12-06 (×2): 4 mg via ORAL
  Filled 2020-12-04 (×2): qty 2

## 2020-12-04 NOTE — Progress Notes (Signed)
[facility] Progress Note  Patient: [name]    [id number]  DOA: 12/02/2020     Date of Service: the patient was seen and examined on 12/04/2020  Brief hospital course: Past medical history of hypothyroidism, HTN, GERD, depression, osteoarthritis, active smoker.  Presents with complaints of cough and shortness of breath.  Found to have COVID-19 pneumonia. Currently plan is continue current supportive care for hypoxia.  Assessment and Plan: 1.  Acute hypoxic respiratory failure, POA secondary to COVID-19 pneumonia. Acute COVID-19 Viral Pneumonia CXR: hazy bilateral peripheral opacities CT chest: GGO, consolidation Oxygen requirement: 15 LPM, 85% to 91% saturation CRP: 1.2, ferritin still elevated.,  D-dimer still more than 20 Remdesivir: Completed 12/23 Steroids: On Solu-Medrol twice daily Baricitinib/Actemra(off-label use): Baricitinib initiated on 12/20 The investigational nature of this medication was discussed with the patient/HCPOA and they choose to proceed as the potential benefits are felt to outweigh risks at this time.  Antibiotics: Currently not indicated Vitamin C and Zinc: Continue DVT Prophylaxis: enoxaparin (LOVENOX) injection 40 mg Start: 11/30/20 0600 Prone positioning and incentive spirometer use recommended.  The treatment plan and use of medications and known side effects were discussed with patient/family. It was clearly explained that Complete risks and long-term side effects are unknown, however in the best clinical judgment they seem to be of some clinical benefit rather than medical risks. Patient/family agree with the treatment plan and want to receive these treatments as indicated.   2.  D-dimer elevation. CT PE negative, lower extremity Doppler negative.  Continue to monitor.  3.  Elevated LFT Secondary to COVID-19 illness. Monitor.  4.  Thrombocytopenia After initial improvement. Monitor. Secondary to COVID-19 pneumonia.   Monitor.  5.  Steroid-induced hyperglycemia Continue sliding scale insulin.  6.  Hypothyroidism Continue Synthroid  7.  GERD Continue PPI  Body mass index is 26.21 kg/m.    Interventions:        Diet: Cardiac diet DVT Prophylaxis:   enoxaparin (LOVENOX) injection 40 mg Start: 11/30/20 0600    Advance goals of care discussion: Full code  Family Communication: no family was present at bedside, at the time of interview.  Discussed with daughter on the phone  Disposition:  Status is: Inpatient  Remains inpatient appropriate because:IV treatments appropriate due to intensity of illness or inability to take PO   Dispo:  Patient From: Home  Planned Disposition: Home  Expected discharge date: 12/05/2020  Medically stable for discharge: No   Subjective: Continues to have cough and shortness of breath. Feels more fatigued and tired today. No nausea no vomiting. No chest pain.  Physical Exam:  General: Appear in moderate distress, no Rash; Oral Mucosa Clear, moist. no Abnormal Neck Mass Or lumps, Conjunctiva normal  Cardiovascular: S1 and S2 Present, no Murmur, Respiratory: increased respiratory effort, Bilateral Air entry present and bilateral  Crackles, no wheezes Abdomen: Bowel Sound present, Soft and no tenderness Extremities: no Pedal edema Neurology: alert and oriented to time, place, and person affect appropriate. no new focal deficit Gait not checked due to patient safety concerns  Vitals:   12/04/20 1218 12/04/20 1300 12/04/20 1301 12/04/20 1534  BP: 127/85     Pulse: 81   80  Resp: (!) 25   20  Temp: 98.7 F (37.1 C)     TempSrc: Oral     SpO2: 92% (!) 85% 93%   Weight:      Height:        Intake/Output Summary (Last 24 hours) at 12/04/2020 1641  Last data filed at 12/04/2020 2297 Gross per 24 hour  Intake --  Output 752 ml  Net -752 ml   Filed Weights   2020-12-23 1935 12/01/20 1426  Weight: 82.6 kg 75.9 kg    Data Reviewed: I have  personally reviewed and interpreted daily labs, tele strips, imagings as discussed above. I reviewed all nursing notes, pharmacy notes, vitals, pertinent old records I have discussed plan of care as described above with RN and patient/family.  CBC: Recent Labs  Lab 11/30/20 0521 12/01/20 1300 12/02/20 0519 12/03/20 0513 12/04/20 0456  WBC 9.4 11.8* 12.9* 21.0* 18.7*  NEUTROABS 7.4 8.3* 8.3* 13.2* 12.7*  HGB 12.3 11.8* 11.6* 12.3 11.8*  HCT 37.3 35.9* 35.4* 37.8 36.0  MCV 106.9* 108.8* 107.9* 109.6* 106.2*  PLT 143* 183 167 164 989*   Basic Metabolic Panel: Recent Labs  Lab 11/30/20 0521 12/01/20 1300 12/02/20 0519 12/03/20 0513 12/04/20 0456  NA 143 143 142 145 146*  K 4.1 4.2 4.3 4.1 4.0  CL 109 107 107 111 112*  CO2 21* 23 22 22 22   GLUCOSE 200* 152* 168* 162* 153*  BUN 16 28* 35* 36* 36*  CREATININE 0.95 0.92 0.97 1.09* 0.99  CALCIUM 8.5* 8.6* 8.9 9.0 9.1  MG 2.1 2.3 2.5* 2.6* 2.6*  PHOS 2.9 2.4* 3.9 3.3 3.2    Studies: DG CHEST PORT 1 VIEW  Result Date: 12/04/2020 CLINICAL DATA:  Shortness of breath EXAM: PORTABLE CHEST 1 VIEW COMPARISON:  12/23/2020, CT from 12/01/2020 FINDINGS: Cardiac shadow is stable. Mild bibasilar atelectatic changes are noted. Diffuse hazy opacity is noted throughout both lung similar to that seen on prior CT. No new focal abnormality is noted. IMPRESSION: Diffuse hazy opacity bilaterally consistent with the given clinical history of COVID-19 positivity. Some mild increased atelectatic changes in the left base are noted. Electronically Signed   By: Inez Catalina M.D.   On: 12/04/2020 08:31    Scheduled Meds: . vitamin C  500 mg Oral Daily  . [START ON 12/05/2020] baricitinib  4 mg Oral Daily  . cholecalciferol  1,000 Units Oral Daily  . enoxaparin (LOVENOX) injection  40 mg Subcutaneous Q24H  . famotidine  20 mg Oral QHS  . fluticasone  1 spray Each Nare Daily  . gabapentin  300 mg Oral TID  . insulin aspart  0-9 Units Subcutaneous TID  WC  . Ipratropium-Albuterol  1 puff Inhalation Q6H  . levothyroxine  125 mcg Oral Q0600  . loratadine  10 mg Oral Daily  . methylPREDNISolone (SOLU-MEDROL) injection  60 mg Intravenous Q12H  . mometasone-formoterol  2 puff Inhalation BID  . vitamin B-12  100 mcg Oral Daily  . zinc sulfate  220 mg Oral Daily   Continuous Infusions:  PRN Meds: acetaminophen, chlorpheniramine-HYDROcodone, guaiFENesin-dextromethorphan, ondansetron **OR** ondansetron (ZOFRAN) IV  Time spent: 35 minutes  Author: Berle Mull, MD [facility] 12/04/2020 4:41 PM  To reach On-call, see care teams to locate the attending and reach out via [email or web address]. Between 7PM-7AM, please contact night-coverage If you still have difficulty reaching the attending provider, please page the [facility] (Director on Call) for [facility] on amion for assistance.

## 2020-12-05 LAB — CBC WITH DIFFERENTIAL/PLATELET
Abs Immature Granulocytes: 0.9 10*3/uL — ABNORMAL HIGH (ref 0.00–0.07)
Basophils Absolute: 0.1 10*3/uL (ref 0.0–0.1)
Basophils Relative: 1 %
Eosinophils Absolute: 0 10*3/uL (ref 0.0–0.5)
Eosinophils Relative: 0 %
HCT: 36.6 % (ref 36.0–46.0)
Hemoglobin: 12.1 g/dL (ref 12.0–15.0)
Immature Granulocytes: 4 %
Lymphocytes Relative: 7 %
Lymphs Abs: 1.6 10*3/uL (ref 0.7–4.0)
MCH: 35 pg — ABNORMAL HIGH (ref 26.0–34.0)
MCHC: 33.1 g/dL (ref 30.0–36.0)
MCV: 105.8 fL — ABNORMAL HIGH (ref 80.0–100.0)
Monocytes Absolute: 2 10*3/uL — ABNORMAL HIGH (ref 0.1–1.0)
Monocytes Relative: 9 %
Neutro Abs: 17.9 10*3/uL — ABNORMAL HIGH (ref 1.7–7.7)
Neutrophils Relative %: 79 %
Platelets: 100 10*3/uL — ABNORMAL LOW (ref 150–400)
RBC: 3.46 MIL/uL — ABNORMAL LOW (ref 3.87–5.11)
RDW: 13.1 % (ref 11.5–15.5)
WBC: 22.5 10*3/uL — ABNORMAL HIGH (ref 4.0–10.5)
nRBC: 0.8 % — ABNORMAL HIGH (ref 0.0–0.2)

## 2020-12-05 LAB — COMPREHENSIVE METABOLIC PANEL
ALT: 63 U/L — ABNORMAL HIGH (ref 0–44)
AST: 105 U/L — ABNORMAL HIGH (ref 15–41)
Albumin: 3.3 g/dL — ABNORMAL LOW (ref 3.5–5.0)
Alkaline Phosphatase: 135 U/L — ABNORMAL HIGH (ref 38–126)
Anion gap: 11 (ref 5–15)
BUN: 37 mg/dL — ABNORMAL HIGH (ref 8–23)
CO2: 21 mmol/L — ABNORMAL LOW (ref 22–32)
Calcium: 9.2 mg/dL (ref 8.9–10.3)
Chloride: 113 mmol/L — ABNORMAL HIGH (ref 98–111)
Creatinine, Ser: 1.02 mg/dL — ABNORMAL HIGH (ref 0.44–1.00)
GFR, Estimated: 60 mL/min — ABNORMAL LOW (ref >60)
Glucose, Bld: 178 mg/dL — ABNORMAL HIGH (ref 70–99)
Potassium: 4.3 mmol/L (ref 3.5–5.1)
Sodium: 145 mmol/L (ref 135–145)
Total Bilirubin: 1.4 mg/dL — ABNORMAL HIGH (ref 0.3–1.2)
Total Protein: 7.2 g/dL (ref 6.5–8.1)

## 2020-12-05 LAB — CULTURE, BLOOD (ROUTINE X 2): Culture: NO GROWTH

## 2020-12-05 LAB — C-REACTIVE PROTEIN: CRP: 1.2 mg/dL — ABNORMAL HIGH (ref <1.0)

## 2020-12-05 LAB — MAGNESIUM: Magnesium: 2.6 mg/dL — ABNORMAL HIGH (ref 1.7–2.4)

## 2020-12-05 LAB — GLUCOSE, CAPILLARY
Glucose-Capillary: 170 mg/dL — ABNORMAL HIGH (ref 70–99)
Glucose-Capillary: 224 mg/dL — ABNORMAL HIGH (ref 70–99)
Glucose-Capillary: 248 mg/dL — ABNORMAL HIGH (ref 70–99)

## 2020-12-05 LAB — FERRITIN: Ferritin: 4700 ng/mL — ABNORMAL HIGH (ref 11–307)

## 2020-12-05 MED ORDER — SENNOSIDES-DOCUSATE SODIUM 8.6-50 MG PO TABS
1.0000 | ORAL_TABLET | Freq: Two times a day (BID) | ORAL | Status: DC
  Administered 2020-12-05 (×2): 1 via ORAL
  Filled 2020-12-05 (×2): qty 1

## 2020-12-05 MED ORDER — POLYETHYLENE GLYCOL 3350 17 G PO PACK
17.0000 g | PACK | Freq: Every day | ORAL | Status: DC
  Administered 2020-12-05: 12:00:00 17 g via ORAL
  Filled 2020-12-05: qty 1

## 2020-12-05 MED ORDER — ZOLPIDEM TARTRATE 5 MG PO TABS: 5.0000 mg | ORAL_TABLET | Freq: Every evening | ORAL | Status: DC | PRN

## 2020-12-05 MED ORDER — FUROSEMIDE 10 MG/ML IJ SOLN
20.0000 mg | Freq: Once | INTRAMUSCULAR | Status: AC
  Administered 2020-12-05: 21:00:00 20 mg via INTRAVENOUS
  Filled 2020-12-05: qty 2

## 2020-12-05 NOTE — Progress Notes (Signed)
[facility] Progress Note  Patient: [name]    VVO:[id number]  DOA: 12/05/20     Date of Service: the patient was seen and examined on 12/05/2020  Brief hospital course: Past medical history of hypothyroidism, HTN, GERD, depression, osteoarthritis, active smoker.  Presents with complaints of cough and shortness of breath.  Found to have COVID-19 pneumonia. Currently plan is continue current supportive care for hypoxia.  Assessment and Plan: 1.  Acute hypoxic respiratory failure, POA  88% on room air on admission Acute COVID-19 Viral Pneumonia CXR: hazy bilateral peripheral opacities CT chest: GGO, consolidation Oxygen requirement: 15 LPM, 91% saturation CRP: 1.2, ferritin still elevated, but trending down.  Remdesivir: Completed 12/23 Steroids: On Solu-Medrol twice daily Baricitinib/Actemra(off-label use): Baricitinib initiated on 12/20 The investigational nature of this medication was discussed with the patient/HCPOA and they choose to proceed as the potential benefits are felt to outweigh risks at this time.  Antibiotics: Currently not indicated Vitamin C and Zinc: Continue DVT Prophylaxis: enoxaparin (LOVENOX) injection 40 mg Start: 11/30/20 0600 Prone positioning and incentive spirometer use recommended.  The treatment plan and use of medications and known side effects were discussed with patient/family. It was clearly explained that Complete risks and long-term side effects are unknown, however in the best clinical judgment they seem to be of some clinical benefit rather than medical risks. Patient/family agree with the treatment plan and want to receive these treatments as indicated.   2.  D-dimer elevation. CT PE negative, lower extremity Doppler negative.  Continue to monitor.  3.  Elevated LFT Secondary to COVID-19 illness. Monitor.  4.  Thrombocytopenia After initial improvement. Monitor. Secondary to COVID-19 pneumonia.  Monitor.  5.   Steroid-induced hyperglycemia Continue sliding scale insulin.  6.  Hypothyroidism Continue Synthroid  7.  GERD Continue PPI  Body mass index is 26.21 kg/m.    Interventions:        Diet: Cardiac diet DVT Prophylaxis:   enoxaparin (LOVENOX) injection 40 mg Start: 11/30/20 0600    Advance goals of care discussion: Full code  Family Communication: no family was present at bedside, at the time of interview.  Discussed with daughter on the phone  Disposition:  Status is: Inpatient  Remains inpatient appropriate because:IV treatments appropriate due to intensity of illness or inability to take PO   Dispo:  Patient From: Home  Planned Disposition: To be determined  Expected discharge date: 12/09/2020  Medically stable for discharge: No   Subjective: Still with cough and shortness of breath.  No nausea no vomiting.  No fever no chills.  Feeling better than yesterday.  Physical Exam:  General: Appear in mild distress, no Rash; Oral Mucosa Clear, moist. no Abnormal Neck Mass Or lumps, Conjunctiva normal  Cardiovascular: S1 and S2 Present, no Murmur, Respiratory: good respiratory effort, Bilateral Air entry present and bilateral  Crackles, no wheezes Abdomen: Bowel Sound present, Soft and no tenderness Extremities: no Pedal edema Neurology: alert and oriented to time, place, and person affect appropriate. no new focal deficit Gait not checked due to patient safety concerns   Vitals:   12/05/20 0506 12/05/20 0843 12/05/20 1149 12/05/20 1437  BP: (!) 142/91  (!) 131/91   Pulse: 74  88   Resp:   (!) 25   Temp: 97.6 F (36.4 C)  98.5 F (36.9 C)   TempSrc: Axillary  Oral   SpO2: 90% 100% 92% 92%  Weight:      Height:       No intake  or output data in the 24 hours ending 12/05/20 1744 Filed Weights   11/18/2020 1935 12/01/20 1426  Weight: 82.6 kg 75.9 kg    Data Reviewed: I have personally reviewed and interpreted daily labs, tele strips, imagings as discussed  above. I reviewed all nursing notes, pharmacy notes, vitals, pertinent old records I have discussed plan of care as described above with RN and patient/family.  CBC: Recent Labs  Lab 12/01/20 1300 12/02/20 0519 12/03/20 0513 12/04/20 0456 12/05/20 0813  WBC 11.8* 12.9* 21.0* 18.7* 22.5*  NEUTROABS 8.3* 8.3* 13.2* 12.7* 17.9*  HGB 11.8* 11.6* 12.3 11.8* 12.1  HCT 35.9* 35.4* 37.8 36.0 36.6  MCV 108.8* 107.9* 109.6* 106.2* 105.8*  PLT 183 167 164 125* 123XX123*   Basic Metabolic Panel: Recent Labs  Lab 11/30/20 0521 12/01/20 1300 12/02/20 0519 12/03/20 0513 12/04/20 0456 12/05/20 0813  NA 143 143 142 145 146* 145  K 4.1 4.2 4.3 4.1 4.0 4.3  CL 109 107 107 111 112* 113*  CO2 21* 23 22 22 22  21*  GLUCOSE 200* 152* 168* 162* 153* 178*  BUN 16 28* 35* 36* 36* 37*  CREATININE 0.95 0.92 0.97 1.09* 0.99 1.02*  CALCIUM 8.5* 8.6* 8.9 9.0 9.1 9.2  MG 2.1 2.3 2.5* 2.6* 2.6* 2.6*  PHOS 2.9 2.4* 3.9 3.3 3.2  --     Studies: No results found.  Scheduled Meds: . vitamin C  500 mg Oral Daily  . baricitinib  4 mg Oral Daily  . cholecalciferol  1,000 Units Oral Daily  . enoxaparin (LOVENOX) injection  40 mg Subcutaneous Q24H  . famotidine  20 mg Oral QHS  . fluticasone  1 spray Each Nare Daily  . gabapentin  300 mg Oral TID  . insulin aspart  0-9 Units Subcutaneous TID WC  . Ipratropium-Albuterol  1 puff Inhalation Q6H  . levothyroxine  125 mcg Oral Q0600  . loratadine  10 mg Oral Daily  . methylPREDNISolone (SOLU-MEDROL) injection  60 mg Intravenous Q12H  . mometasone-formoterol  2 puff Inhalation BID  . polyethylene glycol  17 g Oral Daily  . senna-docusate  1 tablet Oral BID  . vitamin B-12  100 mcg Oral Daily  . zinc sulfate  220 mg Oral Daily   Continuous Infusions:  PRN Meds: acetaminophen, chlorpheniramine-HYDROcodone, guaiFENesin-dextromethorphan, ondansetron **OR** ondansetron (ZOFRAN) IV  Time spent: 35 minutes  Author: Berle Mull, MD [facility] 12/05/2020 5:44 PM  To reach On-call, see care teams to locate the attending and reach out via [email or web address]. Between 7PM-7AM, please contact night-coverage If you still have difficulty reaching the attending provider, please page the [facility] (Director on Call) for [facility] on amion for assistance.

## 2020-12-05 NOTE — Progress Notes (Signed)
Patient was able to get to the chair and to the bedside commode but was very SHOB. Continues to be on 15HFNC and 15 non-rebreathe with O2 stats in the mid 90s. Safety maintained will continue to monitor.

## 2020-12-06 ENCOUNTER — Inpatient Hospital Stay (HOSPITAL_COMMUNITY): Payer: Medicare Other

## 2020-12-06 DIAGNOSIS — G9340 Encephalopathy, unspecified: Secondary | ICD-10-CM

## 2020-12-06 LAB — CBC WITH DIFFERENTIAL/PLATELET
Abs Immature Granulocytes: 0.81 10*3/uL — ABNORMAL HIGH (ref 0.00–0.07)
Basophils Absolute: 0.1 10*3/uL (ref 0.0–0.1)
Basophils Relative: 1 %
Eosinophils Absolute: 0 10*3/uL (ref 0.0–0.5)
Eosinophils Relative: 0 %
HCT: 40 % (ref 36.0–46.0)
Hemoglobin: 13.1 g/dL (ref 12.0–15.0)
Immature Granulocytes: 4 %
Lymphocytes Relative: 5 %
Lymphs Abs: 1.2 10*3/uL (ref 0.7–4.0)
MCH: 35.6 pg — ABNORMAL HIGH (ref 26.0–34.0)
MCHC: 32.8 g/dL (ref 30.0–36.0)
MCV: 108.7 fL — ABNORMAL HIGH (ref 80.0–100.0)
Monocytes Absolute: 1.5 10*3/uL — ABNORMAL HIGH (ref 0.1–1.0)
Monocytes Relative: 7 %
Neutro Abs: 18.3 10*3/uL — ABNORMAL HIGH (ref 1.7–7.7)
Neutrophils Relative %: 83 %
Platelets: 127 10*3/uL — ABNORMAL LOW (ref 150–400)
RBC: 3.68 MIL/uL — ABNORMAL LOW (ref 3.87–5.11)
RDW: 13.2 % (ref 11.5–15.5)
WBC: 21.9 10*3/uL — ABNORMAL HIGH (ref 4.0–10.5)
nRBC: 0.7 % — ABNORMAL HIGH (ref 0.0–0.2)

## 2020-12-06 LAB — T4, FREE: Free T4: 1.53 ng/dL — ABNORMAL HIGH (ref 0.61–1.12)

## 2020-12-06 LAB — BLOOD GAS, ARTERIAL
Acid-Base Excess: 1.2 mmol/L (ref 0.0–2.0)
Bicarbonate: 22.3 mmol/L (ref 20.0–28.0)
FIO2: 100
O2 Saturation: 89.4 %
Patient temperature: 98.6
pCO2 arterial: 26.1 mmHg — ABNORMAL LOW (ref 32.0–48.0)
pH, Arterial: 7.54 — ABNORMAL HIGH (ref 7.350–7.450)
pO2, Arterial: 57.9 mmHg — ABNORMAL LOW (ref 83.0–108.0)

## 2020-12-06 LAB — MRSA PCR SCREENING: MRSA by PCR: NEGATIVE

## 2020-12-06 LAB — COMPREHENSIVE METABOLIC PANEL
ALT: 100 U/L — ABNORMAL HIGH (ref 0–44)
AST: 138 U/L — ABNORMAL HIGH (ref 15–41)
Albumin: 3.5 g/dL (ref 3.5–5.0)
Alkaline Phosphatase: 134 U/L — ABNORMAL HIGH (ref 38–126)
Anion gap: 13 (ref 5–15)
BUN: 37 mg/dL — ABNORMAL HIGH (ref 8–23)
CO2: 26 mmol/L (ref 22–32)
Calcium: 10 mg/dL (ref 8.9–10.3)
Chloride: 110 mmol/L (ref 98–111)
Creatinine, Ser: 1.24 mg/dL — ABNORMAL HIGH (ref 0.44–1.00)
GFR, Estimated: 47 mL/min — ABNORMAL LOW (ref >60)
Glucose, Bld: 200 mg/dL — ABNORMAL HIGH (ref 70–99)
Potassium: 4.4 mmol/L (ref 3.5–5.1)
Sodium: 149 mmol/L — ABNORMAL HIGH (ref 135–145)
Total Bilirubin: 1.5 mg/dL — ABNORMAL HIGH (ref 0.3–1.2)
Total Protein: 7.7 g/dL (ref 6.5–8.1)

## 2020-12-06 LAB — PROTIME-INR
INR: 1.3 — ABNORMAL HIGH (ref 0.8–1.2)
Prothrombin Time: 15.6 s — ABNORMAL HIGH (ref 11.4–15.2)

## 2020-12-06 LAB — APTT: aPTT: 27 seconds (ref 24–36)

## 2020-12-06 LAB — COMPREHENSIVE METABOLIC PANEL WITH GFR
ALT: 121 U/L — ABNORMAL HIGH (ref 0–44)
AST: 142 U/L — ABNORMAL HIGH (ref 15–41)
Albumin: 3.5 g/dL (ref 3.5–5.0)
Alkaline Phosphatase: 137 U/L — ABNORMAL HIGH (ref 38–126)
Anion gap: 16 — ABNORMAL HIGH (ref 5–15)
BUN: 43 mg/dL — ABNORMAL HIGH (ref 8–23)
CO2: 22 mmol/L (ref 22–32)
Calcium: 9.7 mg/dL (ref 8.9–10.3)
Chloride: 112 mmol/L — ABNORMAL HIGH (ref 98–111)
Creatinine, Ser: 1.47 mg/dL — ABNORMAL HIGH (ref 0.44–1.00)
GFR, Estimated: 39 mL/min — ABNORMAL LOW
Glucose, Bld: 193 mg/dL — ABNORMAL HIGH (ref 70–99)
Potassium: 4.4 mmol/L (ref 3.5–5.1)
Sodium: 150 mmol/L — ABNORMAL HIGH (ref 135–145)
Total Bilirubin: 1.9 mg/dL — ABNORMAL HIGH (ref 0.3–1.2)
Total Protein: 7.8 g/dL (ref 6.5–8.1)

## 2020-12-06 LAB — PROCALCITONIN: Procalcitonin: 0.12 ng/mL

## 2020-12-06 LAB — AMMONIA: Ammonia: 20 umol/L (ref 9–35)

## 2020-12-06 LAB — LACTIC ACID, PLASMA
Lactic Acid, Venous: 7.9 mmol/L (ref 0.5–1.9)
Lactic Acid, Venous: 5.1 mmol/L (ref 0.5–1.9)

## 2020-12-06 LAB — GLUCOSE, CAPILLARY
Glucose-Capillary: 216 mg/dL — ABNORMAL HIGH (ref 70–99)
Glucose-Capillary: 188 mg/dL — ABNORMAL HIGH (ref 70–99)
Glucose-Capillary: 163 mg/dL — ABNORMAL HIGH (ref 70–99)

## 2020-12-06 LAB — D-DIMER, QUANTITATIVE: D-Dimer, Quant: >20 ug/mL-FEU — ABNORMAL HIGH (ref 0.00–0.50)

## 2020-12-06 LAB — C-REACTIVE PROTEIN: CRP: 2 mg/dL — ABNORMAL HIGH (ref <1.0)

## 2020-12-06 LAB — FERRITIN: Ferritin: 4314 ng/mL — ABNORMAL HIGH (ref 11–307)

## 2020-12-06 LAB — VITAMIN B12: Vitamin B-12: 1751 pg/mL — ABNORMAL HIGH (ref 180–914)

## 2020-12-06 LAB — TSH: TSH: 0.404 u[IU]/mL (ref 0.350–4.500)

## 2020-12-06 LAB — MAGNESIUM: Magnesium: 2.7 mg/dL — ABNORMAL HIGH (ref 1.7–2.4)

## 2020-12-06 MED ORDER — LORAZEPAM 2 MG/ML IJ SOLN: 1.0000 mg | Freq: Once | INTRAMUSCULAR | Status: DC

## 2020-12-06 MED ORDER — INSULIN ASPART 100 UNIT/ML ~~LOC~~ SOLN
0.0000 [IU] | SUBCUTANEOUS | Status: DC
  Administered 2020-12-06 – 2020-12-07 (×3): 2 [IU] via SUBCUTANEOUS
  Administered 2020-12-07 (×3): 3 [IU] via SUBCUTANEOUS
  Administered 2020-12-07 – 2020-12-08 (×2): 2 [IU] via SUBCUTANEOUS
  Administered 2020-12-08: 20:00:00 7 [IU] via SUBCUTANEOUS
  Administered 2020-12-08: 09:00:00 2 [IU] via SUBCUTANEOUS
  Administered 2020-12-08: 17:00:00 3 [IU] via SUBCUTANEOUS
  Administered 2020-12-08: 04:00:00 2 [IU] via SUBCUTANEOUS
  Administered 2020-12-08: 13:00:00 3 [IU] via SUBCUTANEOUS
  Administered 2020-12-09: 05:00:00 9 [IU] via SUBCUTANEOUS
  Administered 2020-12-09: 09:00:00 5 [IU] via SUBCUTANEOUS
  Administered 2020-12-09: 9 [IU] via SUBCUTANEOUS

## 2020-12-06 MED ORDER — IOHEXOL 350 MG/ML SOLN
100.0000 mL | Freq: Once | INTRAVENOUS | Status: AC | PRN
  Administered 2020-12-06: 15:00:00 100 mL via INTRAVENOUS

## 2020-12-06 MED ORDER — DEXTROSE 5 % IV SOLN: INTRAVENOUS | Status: DC

## 2020-12-06 MED ORDER — CHLORHEXIDINE GLUCONATE 0.12 % MT SOLN
15.0000 mL | Freq: Two times a day (BID) | OROMUCOSAL | Status: DC
  Administered 2020-12-06 – 2020-12-07 (×2): 15 mL via OROMUCOSAL
  Filled 2020-12-06: qty 15

## 2020-12-06 MED ORDER — ORAL CARE MOUTH RINSE
15.0000 mL | Freq: Two times a day (BID) | OROMUCOSAL | Status: DC
  Administered 2020-12-07 (×2): 15 mL via OROMUCOSAL

## 2020-12-06 MED ORDER — METOPROLOL TARTRATE 5 MG/5ML IV SOLN
5.0000 mg | INTRAVENOUS | Status: DC | PRN
  Administered 2020-12-06: 15:00:00 5 mg via INTRAVENOUS
  Filled 2020-12-06: qty 5

## 2020-12-06 MED ORDER — LEVETIRACETAM IN NACL 500 MG/100ML IV SOLN
500.0000 mg | Freq: Two times a day (BID) | INTRAVENOUS | Status: DC
  Administered 2020-12-06 – 2020-12-14 (×16): 500 mg via INTRAVENOUS
  Filled 2020-12-06 (×18): qty 100

## 2020-12-06 MED ORDER — PIPERACILLIN-TAZOBACTAM 3.375 G IVPB
3.3750 g | Freq: Three times a day (TID) | INTRAVENOUS | Status: DC
  Administered 2020-12-06 – 2020-12-10 (×12): 3.375 g via INTRAVENOUS
  Filled 2020-12-06 (×12): qty 50

## 2020-12-06 MED ORDER — CHLORHEXIDINE GLUCONATE CLOTH 2 % EX PADS
6.0000 | MEDICATED_PAD | Freq: Every day | CUTANEOUS | Status: DC
  Administered 2020-12-08 – 2020-12-18 (×11): 6 via TOPICAL

## 2020-12-06 MED ORDER — LEVOTHYROXINE SODIUM 100 MCG/5ML IV SOLN
62.5000 ug | Freq: Every day | INTRAVENOUS | Status: DC
  Administered 2020-12-07 – 2020-12-14 (×8): 62.5 ug via INTRAVENOUS
  Filled 2020-12-06 (×8): qty 5

## 2020-12-06 MED ORDER — VANCOMYCIN HCL IN DEXTROSE 1-5 GM/200ML-% IV SOLN
1000.0000 mg | INTRAVENOUS | Status: DC
  Administered 2020-12-07 – 2020-12-09 (×3): 1000 mg via INTRAVENOUS
  Filled 2020-12-06 (×3): qty 200

## 2020-12-06 MED ORDER — HEPARIN BOLUS VIA INFUSION
2000.0000 [IU] | Freq: Once | INTRAVENOUS | Status: DC
  Administered 2020-12-06: 19:00:00 2000 [IU] via INTRAVENOUS
  Filled 2020-12-06: qty 2000

## 2020-12-06 MED ORDER — ORAL CARE MOUTH RINSE
15.0000 mL | Freq: Two times a day (BID) | OROMUCOSAL | Status: DC
  Administered 2020-12-06: 15:00:00 15 mL via OROMUCOSAL

## 2020-12-06 MED ORDER — VANCOMYCIN HCL 1500 MG/300ML IV SOLN
1500.0000 mg | Freq: Once | INTRAVENOUS | Status: AC
  Administered 2020-12-06: 16:00:00 1500 mg via INTRAVENOUS
  Filled 2020-12-06: qty 300

## 2020-12-06 MED ORDER — LORAZEPAM 2 MG/ML IJ SOLN: 1.0000 mg | INTRAMUSCULAR | Status: DC | PRN

## 2020-12-06 MED ORDER — CHLORHEXIDINE GLUCONATE CLOTH 2 % EX PADS
6.0000 | MEDICATED_PAD | Freq: Every day | CUTANEOUS | Status: DC
  Administered 2020-12-06 – 2020-12-08 (×3): 6 via TOPICAL

## 2020-12-06 MED ORDER — IPRATROPIUM-ALBUTEROL 20-100 MCG/ACT IN AERS
1.0000 | INHALATION_SPRAY | Freq: Three times a day (TID) | RESPIRATORY_TRACT | Status: DC
  Administered 2020-12-07 (×3): 1 via RESPIRATORY_TRACT

## 2020-12-06 MED ORDER — LORAZEPAM 2 MG/ML IJ SOLN
INTRAMUSCULAR | Status: AC
  Filled 2020-12-06: qty 1

## 2020-12-06 MED ORDER — HEPARIN (PORCINE) 25000 UT/250ML-% IV SOLN
1250.0000 [IU]/h | INTRAVENOUS | Status: DC
  Administered 2020-12-06: 19:00:00 1250 [IU]/h via INTRAVENOUS
  Filled 2020-12-06: qty 250

## 2020-12-06 NOTE — Progress Notes (Signed)
ANTICOAGULATION CONSULT NOTE - Initial Consult  Pharmacy Consult for IV heparin Indication: pulmonary embolus  No Known Allergies  Patient Measurements: Height: 5\' 7"  (170.2 cm) Weight: 75.9 kg (167 lb 5.3 oz) IBW/kg (Calculated) : 61.6 Heparin Dosing Weight: actual body weight   Vital Signs: Temp: 98.6 F (37 C) (12/26 1418) Temp Source: Axillary (12/26 1418) BP: 136/97 (12/26 1600) Pulse Rate: 116 (12/26 1600)  Labs: Recent Labs    12/04/20 0456 12/05/20 0813 12/06/20 0337  HGB 11.8* 12.1 13.1  HCT 36.0 36.6 40.0  PLT 125* 100* 127*  CREATININE 0.99 1.02* 1.24*    Estimated Creatinine Clearance: 46.1 mL/min (A) (by C-G formula based on SCr of 1.24 mg/dL (H)).   Medical History: Past Medical History:  Diagnosis Date  . Arthritis   . Depression   . GERD (gastroesophageal reflux disease)   . Hypertension   . Thyroid disease   . Tobacco abuse     Assessment: ZALIAH WISSNER is a 69 y.o. female admitted on 12/10/20 with COVID pneumonia. D-dimer increased to > 20 on 12/21. CTa chest on 12/21 negative for PE. Lower extremity venous duplex on 12/22 negative for DVT. Developed acute encephalopathy 12/26 and lactic acidosis requiring transfer to ICU. CTa chest today + for acute PE within posterior segmental branches of the left lower lobe, no evidence of RV strain. Pharmacy consulted for IV heparin dosing. Patient has been on Enoxaparin 40mg  SQ q24h for VTE prophylaxis-last dose this morning at 1033. CBC: Hgb WNL at 13.1. Pltc slightly low but improved to 127K.   Goal of Therapy:  Heparin level 0.3-0.7 units/ml Monitor platelets by anticoagulation protocol: Yes   Plan:  Heparin 2000 units IV bolus x 1, then start heparin infusion at 1250 units/hr Heparin level 6 hours after initiation Daily CBC, heparin level Monitor closely for s/sx of bleeding    Lindell Spar, PharmD, BCPS Clinical Pharmacist  12/06/2020,4:50 PM
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
## 2020-12-06 NOTE — Plan of Care (Signed)
  Problem: Education: Goal: Knowledge of General Education information will improve Description: Including pain rating scale, medication(s)/side effects and non-pharmacologic comfort measures Outcome: Progressing   Problem: Activity: Goal: Risk for activity intolerance will decrease Outcome: Progressing   Problem: Coping: Goal: Level of anxiety will decrease Outcome: Progressing   

## 2020-12-06 NOTE — Progress Notes (Signed)
Pharmacy Antibiotic Note  [name] is a 69 y.o. female admitted on 2020/11/30 with COVID pneumonia.  Pharmacy has been consulted for vancomycin and zosyn dosing.  Plan: VAncomycin 1500mg  IV x 1, then 1g IV q24h (expected AUC 463 using SCr 1.24) Zosyn 3.375gm IV q8h (4hr extended infusions) Check vancomycin levels as needed Follow up renal function & cultures  Height: 5\' 7"  (170.2 cm) Weight: 75.9 kg (167 lb 5.3 oz) IBW/kg (Calculated) : 61.6  Temp (24hrs), Avg:98.7 F (37.1 C), Min:97.9 F (36.6 C), Max:100.1 F (37.8 C)  Recent Labs  Lab 11-30-2020 1925 11-30-2020 2144 11/30/20 0521 12/02/20 0519 12/03/20 0513 12/04/20 0456 12/05/20 0813 12/06/20 0337 12/06/20 1323  WBC 7.5  --    < > 12.9* 21.0* 18.7* 22.5* 21.9*  --   CREATININE 0.90  --    < > 0.97 1.09* 0.99 1.02* 1.24*  --   LATICACIDVEN 1.8 1.5  --   --   --   --   --   --  7.9*   < > = values in this interval not displayed.    Estimated Creatinine Clearance: 46.1 mL/min (A) (by C-G formula based on SCr of 1.24 mg/dL (H)).    No Known Allergies  Antimicrobials this admission: Azithro 12/19 >> 12/23 Remdesivir 12/19 >> 12/23  Baricitinib 12/20 >> 12/26 Vanc 12/26 >> Zosyn 12/26 >>  Dose adjustments this admission:  Microbiology results: 12/19 BCx: 1/4 Staph hominis, likely contaminant  12/26 BCx: sent 12/26 MRSA PCR:  Thank you for allowing pharmacy to be a part of this patient's care.  Peggyann Juba, PharmD, BCPS Pharmacy: [phone] 12/06/2020 2:57 PM

## 2020-12-06 NOTE — Progress Notes (Signed)
CRITICAL VALUE ALERT  Critical Value:  LA 7.9  Date & Time Notied: 12/06/2020 1423  Provider Notified: Posey Pronto, MD  Orders Received/Actions taken:

## 2020-12-06 NOTE — Progress Notes (Signed)
Patient was found at the end of bed covered in stool. She took off her tele and oxygen. O2 stat was 68% and she was refusing to put it back on. Called a rapid.

## 2020-12-06 NOTE — Consult Note (Signed)
NAME:  [name], MRN:  [id number], DOB:  [birth year], LOS: 7 ADMISSION DATE:  2020-12-01, CONSULTATION DATE:  12/06/2020  REFERRING MD:  Posey Pronto, [facility], CHIEF COMPLAINT: Encephalopathy, hypoxia  Brief History:  69 year old smoker admitted 12/19 with acute hypoxic respiratory failure due to COVID 19 viral pneumonia.  Has been on nonrebreather and high flow nasal cannula. Developed acute encephalopathy 12/26 and lactic acidosis requiring transfer to ICU  History of Present Illness:  She was admitted 12/19 for severe hypoxia due to Covid pneumonia.  CT angiogram did not show PE.  She was treated with oxygen, remdesivir, steroids, baricitinib and Actemra.  She was on Lovenox for DVT prophylaxis.  Mental status change was noted on 12/26 where she was only oriented to self and repeating herself.  She had an episode of desaturation when she took her oxygen mask off and fecal incontinence.  Oxygen saturations recovered.  She was transferred to ICU .  No seizure activity was noted.  Lactate was found to be 7.9 Head and CT chest was obtained and she was transferred to the ICU  Past Medical History:  hypothyroidism, HTN, GERD, depression, osteoarthritis  Significant Hospital Events:  12/19 hospital admission 12/26 transferred to ICU  Consults:    Procedures:    Significant Diagnostic Tests:  Head CT 12/26 encephalomalacia related to remote infarcts in left parietal occipital, right occipital and left temporal lobe -new compared to 2014.  CT angiogram chest 12/26 acute pulmonary emboli within segmental branches of left lower lobe , small clot burden, no evidence of RV strain  CT angiogram chest 12/21 no evidence of pulmonary embolus, diffuse interlobular septal thickening with GGO especially basis  Micro Data:  Blood culture 12/19 1/4 staph hominis  Antimicrobials:  Zosyn 12/26>> vanc 12/26 >>   Interim History / Subjective:    Objective   Blood pressure (!) 136/97, pulse  (!) 116, temperature 98.6 F (37 C), temperature source Axillary, resp. rate 18, height 5\' 7"  (1.702 m), weight 75.9 kg, SpO2 96 %.    FiO2 (%):  [100 %] 100 %   Intake/Output Summary (Last 24 hours) at 12/06/2020 1636 Last data filed at 12/06/2020 0400 Gross per 24 hour  Intake 600 ml  Output --  Net 600 ml   Filed Weights   12/01/20 1935 12/01/20 1426  Weight: 82.6 kg 75.9 kg    Examination: General: Elderly woman, no distress on high flow nasal cannula and nonrebreather HENT: Eyes closed, would not open long enough for me to examine, pupils bilateral equal and reactive Lungs: Bilateral air entry, no accessory muscle use Cardiovascular: S1-S2 regular, no murmur Abdomen: Soft, nontender Extremities: No edema Neuro: Awake, follows one-step commands good strength both upper and lower extremities, normal reflexes , no clonus, bilateral downgoing plantars, oriented x1 to name, keeps repeating "614" GU: Clear urine  Chest x-ray 12/24 diffuse hazy opacity bilaterally Labs show mild hyponatremia and slight increase in creatinine to 1.2, procalcitonin is low, lactate decreased from 7.9-5.1 , stable leukocytosis  Resolved Hospital Problem list     Assessment & Plan:    Acute hypoxic respiratory failure due to COVID pneumonia -Continue high flow nasal cannula plus nonrebreather, she remains high risk for intubation. She is currently able to maintain her airway -Continue current treatment with Solu-Medrol 60 every 12 -Not sure antibiotics are necessary here, no indication of HAP, leukocytosis may be related to steroids  Small left lower lobe PE -full dose anticoagulation  Acute encephalopathy -no seizure was witnessed  but lactic acidosis and fecal incontinence is suggestive , head CT shows old CVAs -Obtain EEG -Empiric Keppra      Best practice (evaluated daily)  Diet: NPO Pain/Anxiety/Delirium protocol (if indicated): N/A VAP protocol (if indicated): N/A DVT  prophylaxis: Full dose anticoagulation GI prophylaxis: N/A Glucose control: SSI Mobility: Bedrest Disposition: ICU  Goals of Care:  Last date of multidisciplinary goals of care discussion: Per [facility]  Code Status: Full  Labs   CBC: Recent Labs  Lab 12/02/20 0519 12/03/20 0513 12/04/20 0456 12/05/20 0813 12/06/20 0337  WBC 12.9* 21.0* 18.7* 22.5* 21.9*  NEUTROABS 8.3* 13.2* 12.7* 17.9* 18.3*  HGB 11.6* 12.3 11.8* 12.1 13.1  HCT 35.4* 37.8 36.0 36.6 40.0  MCV 107.9* 109.6* 106.2* 105.8* 108.7*  PLT 167 164 125* 100* 127*    Basic Metabolic Panel: Recent Labs  Lab 11/30/20 0521 12/01/20 1300 12/02/20 0519 12/03/20 0513 12/04/20 0456 12/05/20 0813 12/06/20 0337  NA 143 143 142 145 146* 145 149*  K 4.1 4.2 4.3 4.1 4.0 4.3 4.4  CL 109 107 107 111 112* 113* 110  CO2 21* 23 22 22 22  21* 26  GLUCOSE 200* 152* 168* 162* 153* 178* 200*  BUN 16 28* 35* 36* 36* 37* 37*  CREATININE 0.95 0.92 0.97 1.09* 0.99 1.02* 1.24*  CALCIUM 8.5* 8.6* 8.9 9.0 9.1 9.2 10.0  MG 2.1 2.3 2.5* 2.6* 2.6* 2.6* 2.7*  PHOS 2.9 2.4* 3.9 3.3 3.2  --   --    GFR: Estimated Creatinine Clearance: 46.1 mL/min (A) (by C-G formula based on SCr of 1.24 mg/dL (H)). Recent Labs  Lab 12/05/2020 1925 12/05/2020 2144 11/30/20 0521 12/03/20 0513 12/04/20 0456 12/05/20 0813 12/06/20 0337 12/06/20 1323 12/06/20 1610  PROCALCITON 0.10  --   --   --   --   --   --  0.12  --   WBC 7.5  --    < > 21.0* 18.7* 22.5* 21.9*  --   --   LATICACIDVEN 1.8 1.5  --   --   --   --   --  7.9* 5.1*   < > = values in this interval not displayed.    Liver Function Tests: Recent Labs  Lab 12/02/20 0519 12/03/20 0513 12/04/20 0456 12/05/20 0813 12/06/20 0337  AST 84* 89* 70* 105* 138*  ALT 31 37 40 63* 100*  ALKPHOS 113 121 117 135* 134*  BILITOT 0.9 0.8 1.1 1.4* 1.5*  PROT 7.0 7.3 6.8 7.2 7.7  ALBUMIN 3.2* 3.4* 3.3* 3.3* 3.5   No results for input(s): LIPASE, AMYLASE in the last 168 hours. Recent Labs  Lab  12/06/20 1323  AMMONIA 20    ABG    Component Value Date/Time   PHART 7.540 (H) 12/06/2020 1230   PCO2ART 26.1 (L) 12/06/2020 1230   PO2ART 57.9 (L) 12/06/2020 1230   HCO3 22.3 12/06/2020 1230   TCO2 25 04/01/2011 0137   O2SAT 89.4 12/06/2020 1230     Coagulation Profile: No results for input(s): INR, PROTIME in the last 168 hours.  Cardiac Enzymes: No results for input(s): CKTOTAL, CKMB, CKMBINDEX, TROPONINI in the last 168 hours.  HbA1C: Hgb A1c MFr Bld  Date/Time Value Ref Range Status  11/30/2020 05:21 AM 4.9 4.8 - 5.6 % Final    Comment:    (NOTE) Pre diabetes:          5.7%-6.4%  Diabetes:              >6.4%  Glycemic control  for   <7.0% adults with diabetes     CBG: Recent Labs  Lab 12/05/20 0749 12/05/20 1146 12/05/20 1633 12/06/20 0751 12/06/20 1135  GLUCAP 170* 224* 248* 216* 188*    Review of Systems:   Unable to obtain due to altered mental status  Past Medical History:  She,  has a past medical history of Arthritis, Depression, GERD (gastroesophageal reflux disease), Hypertension, Thyroid disease, and Tobacco abuse.   Surgical History:   Past Surgical History:  Procedure Laterality Date  . CESAREAN SECTION    . EYE SURGERY       Social History:   reports that she has been smoking cigarettes. She has a 20.00 pack-year smoking history. She has never used smokeless tobacco. She reports current alcohol use. She reports current drug use. Drug: Marijuana.   Family History:  Her family history includes Breast cancer (age of onset: 55) in her mother; Esophageal cancer in her father; Esophageal cancer (age of onset: 18) in her brother; Healthy in her daughter; Throat cancer in her brother. There is no history of Colon cancer.   Allergies No Known Allergies   Home Medications  Prior to Admission medications   Medication Sig Start Date End Date Taking? Authorizing Provider  cetirizine (ZYRTEC ALLERGY) 10 MG tablet Take 1 tablet (10 mg total)  by mouth daily. 11/24/20  Yes Hall-Potvin, [place], PA-C  Cyanocobalamin (VITAMIN B 12 PO) Take 1 tablet by mouth daily.   Yes [provider]  famotidine (PEPCID) 20 MG tablet One at bedtime Patient taking differently: Take 20 mg by mouth at bedtime. 08/28/15  Yes Tanda Rockers, MD  fluticasone (FLONASE) 50 MCG/ACT nasal spray Place 1 spray into both nostrils daily. 11/24/20  Yes Hall-Potvin, [place], PA-C  Fluticasone-Salmeterol (ADVAIR) 250-50 MCG/DOSE AEPB Inhale 1 puff into the lungs 2 (two) times daily.   Yes [provider]  gabapentin (NEURONTIN) 300 MG capsule Take 300 mg by mouth 3 (three) times daily.   Yes [provider]  hydrochlorothiazide (HYDRODIURIL) 25 MG tablet Take 25 mg by mouth daily.   Yes [provider]  ibuprofen (ADVIL,MOTRIN) 800 MG tablet Take 1 tablet (800 mg total) by mouth every 8 (eight) hours as needed. Patient taking differently: Take 800 mg by mouth every 8 (eight) hours as needed for fever, headache, mild pain, moderate pain or cramping. 01/26/15  Yes Lawyer, Harrell Gave, PA-C  levothyroxine (SYNTHROID, LEVOTHROID) 125 MCG tablet Take 125 mcg by mouth daily before breakfast.   Yes [provider]  irbesartan (AVAPRO) 75 MG tablet TAKE ONE TABLET BY MOUTH ONCE DAILY Patient not taking: No sig reported 12/30/15   Tanda Rockers, MD  pregabalin (LYRICA) 75 MG capsule Take by mouth. Patient not taking: No sig reported 10/26/20   [provider]     Critical care time: 11m     Kara Mead MD. [facility]. [facility] See Amion for pager  If no response to pager , please call [phone]  After 7:00 pm call Elink  [phone]   12/06/2020

## 2020-12-07 ENCOUNTER — Inpatient Hospital Stay (HOSPITAL_COMMUNITY): Payer: Medicare Other | Admitting: Certified Registered"

## 2020-12-07 ENCOUNTER — Inpatient Hospital Stay (HOSPITAL_COMMUNITY): Payer: Medicare Other

## 2020-12-07 ENCOUNTER — Inpatient Hospital Stay (HOSPITAL_COMMUNITY)
Admit: 2020-12-07 | Discharge: 2020-12-07 | Disposition: A | Discharge status: Home / Self Care | Payer: Medicare Other | Attending: Internal Medicine | Admitting: Internal Medicine

## 2020-12-07 DIAGNOSIS — R4182 Altered mental status, unspecified: Secondary | ICD-10-CM

## 2020-12-07 LAB — D-DIMER, QUANTITATIVE: D-Dimer, Quant: >20 ug/mL-FEU — ABNORMAL HIGH (ref 0.00–0.50)

## 2020-12-07 LAB — CBC WITH DIFFERENTIAL/PLATELET
Abs Immature Granulocytes: 0.56 10*3/uL — ABNORMAL HIGH (ref 0.00–0.07)
Basophils Absolute: 0.1 10*3/uL (ref 0.0–0.1)
Basophils Relative: 0 %
Eosinophils Absolute: 0 10*3/uL (ref 0.0–0.5)
Eosinophils Relative: 0 %
HCT: 40.8 % (ref 36.0–46.0)
Hemoglobin: 13.4 g/dL (ref 12.0–15.0)
Immature Granulocytes: 2 %
Lymphocytes Relative: 3 %
Lymphs Abs: 0.9 10*3/uL (ref 0.7–4.0)
MCH: 35.7 pg — ABNORMAL HIGH (ref 26.0–34.0)
MCHC: 32.8 g/dL (ref 30.0–36.0)
MCV: 108.8 fL — ABNORMAL HIGH (ref 80.0–100.0)
Monocytes Absolute: 0.9 10*3/uL (ref 0.1–1.0)
Monocytes Relative: 3 %
Neutro Abs: 22.6 10*3/uL — ABNORMAL HIGH (ref 1.7–7.7)
Neutrophils Relative %: 92 %
Platelets: 134 10*3/uL — ABNORMAL LOW (ref 150–400)
RBC: 3.75 MIL/uL — ABNORMAL LOW (ref 3.87–5.11)
RDW: 13.5 % (ref 11.5–15.5)
WBC: 25 10*3/uL — ABNORMAL HIGH (ref 4.0–10.5)
nRBC: 0.4 % — ABNORMAL HIGH (ref 0.0–0.2)

## 2020-12-07 LAB — C-REACTIVE PROTEIN: CRP: 17.3 mg/dL — ABNORMAL HIGH (ref <1.0)

## 2020-12-07 LAB — COMPREHENSIVE METABOLIC PANEL
ALT: 123 U/L — ABNORMAL HIGH (ref 0–44)
AST: 158 U/L — ABNORMAL HIGH (ref 15–41)
Albumin: 3.4 g/dL — ABNORMAL LOW (ref 3.5–5.0)
Alkaline Phosphatase: 117 U/L (ref 38–126)
Anion gap: 15 (ref 5–15)
BUN: 39 mg/dL — ABNORMAL HIGH (ref 8–23)
CO2: 22 mmol/L (ref 22–32)
Calcium: 9.7 mg/dL (ref 8.9–10.3)
Chloride: 114 mmol/L — ABNORMAL HIGH (ref 98–111)
Creatinine, Ser: 1.34 mg/dL — ABNORMAL HIGH (ref 0.44–1.00)
GFR, Estimated: 43 mL/min — ABNORMAL LOW (ref >60)
Glucose, Bld: 224 mg/dL — ABNORMAL HIGH (ref 70–99)
Potassium: 3.7 mmol/L (ref 3.5–5.1)
Sodium: 151 mmol/L — ABNORMAL HIGH (ref 135–145)
Total Bilirubin: 2.8 mg/dL — ABNORMAL HIGH (ref 0.3–1.2)
Total Protein: 7.5 g/dL (ref 6.5–8.1)

## 2020-12-07 LAB — BASIC METABOLIC PANEL
Anion gap: 15 (ref 5–15)
BUN: 37 mg/dL — ABNORMAL HIGH (ref 8–23)
CO2: 21 mmol/L — ABNORMAL LOW (ref 22–32)
Calcium: 9.3 mg/dL (ref 8.9–10.3)
Chloride: 114 mmol/L — ABNORMAL HIGH (ref 98–111)
Creatinine, Ser: 1.14 mg/dL — ABNORMAL HIGH (ref 0.44–1.00)
GFR, Estimated: 52 mL/min — ABNORMAL LOW (ref >60)
Glucose, Bld: 259 mg/dL — ABNORMAL HIGH (ref 70–99)
Potassium: 3.7 mmol/L (ref 3.5–5.1)
Sodium: 150 mmol/L — ABNORMAL HIGH (ref 135–145)

## 2020-12-07 LAB — PROCALCITONIN: Procalcitonin: 1.5 ng/mL

## 2020-12-07 LAB — LACTIC ACID, PLASMA
Lactic Acid, Venous: 4.2 mmol/L (ref 0.5–1.9)
Lactic Acid, Venous: 3.3 mmol/L (ref 0.5–1.9)

## 2020-12-07 LAB — HEPARIN LEVEL (UNFRACTIONATED)
Heparin Unfractionated: 1.66 IU/mL — ABNORMAL HIGH (ref 0.30–0.70)
Heparin Unfractionated: 1.06 IU/mL — ABNORMAL HIGH (ref 0.30–0.70)

## 2020-12-07 LAB — BLOOD GAS, ARTERIAL
Acid-base deficit: 1.7 mmol/L (ref 0.0–2.0)
Bicarbonate: 21.9 mmol/L (ref 20.0–28.0)
FIO2: 100
O2 Saturation: 89.9 %
Patient temperature: 98.8
pCO2 arterial: 35.4 mmHg (ref 32.0–48.0)
pH, Arterial: 7.409 (ref 7.350–7.450)
pO2, Arterial: 68 mmHg — ABNORMAL LOW (ref 83.0–108.0)

## 2020-12-07 LAB — GLUCOSE, CAPILLARY
Glucose-Capillary: 167 mg/dL — ABNORMAL HIGH (ref 70–99)
Glucose-Capillary: 170 mg/dL — ABNORMAL HIGH (ref 70–99)
Glucose-Capillary: 199 mg/dL — ABNORMAL HIGH (ref 70–99)
Glucose-Capillary: 232 mg/dL — ABNORMAL HIGH (ref 70–99)
Glucose-Capillary: 240 mg/dL — ABNORMAL HIGH (ref 70–99)
Glucose-Capillary: 242 mg/dL — ABNORMAL HIGH (ref 70–99)

## 2020-12-07 LAB — RPR
RPR Ser Ql: REACTIVE — AB
RPR Titer: 1:1 {titer}

## 2020-12-07 LAB — FERRITIN: Ferritin: 7465 ng/mL — ABNORMAL HIGH (ref 11–307)

## 2020-12-07 LAB — TRIGLYCERIDES: Triglycerides: 212 mg/dL — ABNORMAL HIGH (ref <150)

## 2020-12-07 LAB — MAGNESIUM: Magnesium: 2.5 mg/dL — ABNORMAL HIGH (ref 1.7–2.4)

## 2020-12-07 MED ORDER — MIDAZOLAM HCL 2 MG/2ML IJ SOLN
INTRAMUSCULAR | Status: AC
  Filled 2020-12-07: qty 4

## 2020-12-07 MED ORDER — LORAZEPAM 2 MG/ML IJ SOLN
1.0000 mg | INTRAMUSCULAR | Status: DC | PRN
  Administered 2020-12-07 (×2): 2 mg via INTRAVENOUS
  Filled 2020-12-07: qty 2

## 2020-12-07 MED ORDER — SUCCINYLCHOLINE CHLORIDE 200 MG/10ML IV SOSY
PREFILLED_SYRINGE | INTRAVENOUS | Status: AC
  Filled 2020-12-07: qty 10

## 2020-12-07 MED ORDER — CHLORHEXIDINE GLUCONATE 0.12% ORAL RINSE (MEDLINE KIT)
15.0000 mL | Freq: Two times a day (BID) | OROMUCOSAL | Status: DC
  Administered 2020-12-07 – 2020-12-18 (×21): 15 mL via OROMUCOSAL

## 2020-12-07 MED ORDER — VECURONIUM BROMIDE 10 MG IV SOLR
INTRAVENOUS | Status: AC
  Filled 2020-12-07: qty 10

## 2020-12-07 MED ORDER — ETOMIDATE 2 MG/ML IV SOLN
INTRAVENOUS | Status: DC | PRN
  Administered 2020-12-07: 12 mg via INTRAVENOUS

## 2020-12-07 MED ORDER — THIAMINE HCL 100 MG/ML IJ SOLN
100.0000 mg | Freq: Every day | INTRAMUSCULAR | Status: DC
  Administered 2020-12-07: 20:00:00 100 mg via INTRAVENOUS
  Filled 2020-12-07: qty 2

## 2020-12-07 MED ORDER — FENTANYL CITRATE (PF) 100 MCG/2ML IJ SOLN
INTRAMUSCULAR | Status: AC
  Filled 2020-12-07: qty 2

## 2020-12-07 MED ORDER — ETOMIDATE 2 MG/ML IV SOLN
INTRAVENOUS | Status: AC
  Filled 2020-12-07: qty 20

## 2020-12-07 MED ORDER — SUCCINYLCHOLINE CHLORIDE 20 MG/ML IJ SOLN
INTRAMUSCULAR | Status: DC | PRN
  Administered 2020-12-07: 120 mg via INTRAVENOUS

## 2020-12-07 MED ORDER — LORAZEPAM 2 MG/ML IJ SOLN
0.0000 mg | INTRAMUSCULAR | Status: DC
  Administered 2020-12-08: 03:00:00 1 mg via INTRAVENOUS
  Filled 2020-12-07: qty 2

## 2020-12-07 MED ORDER — THIAMINE HCL 100 MG PO TABS: 100.0000 mg | ORAL_TABLET | Freq: Every day | ORAL | Status: DC

## 2020-12-07 MED ORDER — ORAL CARE MOUTH RINSE
15.0000 mL | OROMUCOSAL | Status: DC
  Administered 2020-12-08 – 2020-12-18 (×108): 15 mL via OROMUCOSAL

## 2020-12-07 MED ORDER — LORAZEPAM 1 MG PO TABS: 1.0000 mg | ORAL_TABLET | ORAL | Status: DC | PRN

## 2020-12-07 MED ORDER — FOLIC ACID 1 MG PO TABS: 1.0000 mg | ORAL_TABLET | Freq: Every day | ORAL | Status: DC

## 2020-12-07 MED ORDER — LIDOCAINE HCL (CARDIAC) PF 100 MG/5ML IV SOSY
PREFILLED_SYRINGE | INTRAVENOUS | Status: AC
  Filled 2020-12-07: qty 5

## 2020-12-07 MED ORDER — ROCURONIUM BROMIDE 10 MG/ML (PF) SYRINGE
PREFILLED_SYRINGE | INTRAVENOUS | Status: AC
  Filled 2020-12-07: qty 10

## 2020-12-07 MED ORDER — HEPARIN (PORCINE) 25000 UT/250ML-% IV SOLN
1200.0000 [IU]/h | INTRAVENOUS | Status: AC
  Administered 2020-12-07 – 2020-12-11 (×4): 850 [IU]/h via INTRAVENOUS
  Administered 2020-12-12 – 2020-12-13 (×2): 1200 [IU]/h via INTRAVENOUS
  Filled 2020-12-07 (×6): qty 250

## 2020-12-07 MED ORDER — STERILE WATER FOR INJECTION IJ SOLN
INTRAMUSCULAR | Status: AC
  Filled 2020-12-07: qty 10

## 2020-12-07 MED ORDER — PROPOFOL 1000 MG/100ML IV EMUL
5.0000 ug/kg/min | INTRAVENOUS | Status: DC
  Administered 2020-12-07: 22:00:00 5 ug/kg/min via INTRAVENOUS
  Administered 2020-12-08: 03:00:00 40 ug/kg/min via INTRAVENOUS
  Administered 2020-12-08: 06:00:00 80 ug/kg/min via INTRAVENOUS
  Administered 2020-12-08: 18:00:00 35 ug/kg/min via INTRAVENOUS
  Administered 2020-12-08: 04:00:00 40 ug/kg/min via INTRAVENOUS
  Administered 2020-12-08: 10:00:00 80 ug/kg/min via INTRAVENOUS
  Administered 2020-12-09: 20:00:00 30 ug/kg/min via INTRAVENOUS
  Administered 2020-12-09: 10:00:00 40 ug/kg/min via INTRAVENOUS
  Administered 2020-12-09: 05:00:00 60 ug/kg/min via INTRAVENOUS
  Administered 2020-12-09 – 2020-12-10 (×6): 35 ug/kg/min via INTRAVENOUS
  Administered 2020-12-11: 40 ug/kg/min via INTRAVENOUS
  Administered 2020-12-11 (×2): 50 ug/kg/min via INTRAVENOUS
  Filled 2020-12-07 (×16): qty 100
  Filled 2020-12-07: qty 200

## 2020-12-07 MED ORDER — FENTANYL CITRATE (PF) 100 MCG/2ML IJ SOLN
25.0000 ug | INTRAMUSCULAR | Status: DC | PRN
  Administered 2020-12-08 – 2020-12-11 (×4): 50 ug via INTRAVENOUS
  Filled 2020-12-07 (×2): qty 2

## 2020-12-07 MED ORDER — HEPARIN (PORCINE) 25000 UT/250ML-% IV SOLN: 1050.0000 [IU]/h | INTRAVENOUS | Status: DC

## 2020-12-07 MED ORDER — ADULT MULTIVITAMIN W/MINERALS CH
1.0000 | ORAL_TABLET | Freq: Every day | ORAL | Status: DC
  Filled 2020-12-07: qty 1

## 2020-12-07 MED ORDER — LORAZEPAM 2 MG/ML IJ SOLN: 0.0000 mg | Freq: Three times a day (TID) | INTRAMUSCULAR | Status: DC

## 2020-12-07 MED ORDER — PROPOFOL 500 MG/50ML IV EMUL
INTRAVENOUS | Status: AC
  Filled 2020-12-07: qty 50

## 2020-12-07 NOTE — Progress Notes (Signed)
ANTICOAGULATION CONSULT NOTE - Initial Consult  Pharmacy Consult for IV heparin Indication: pulmonary embolus  No Known Allergies  Patient Measurements: Height: 5\' 7"  (170.2 cm) Weight: 75.9 kg (167 lb 5.3 oz) IBW/kg (Calculated) : 61.6 Heparin Dosing Weight: actual body weight   Vital Signs: Temp: 98.9 F (37.2 C) (12/27 0100) Temp Source: Oral (12/27 0100) BP: 150/89 (12/27 0300) Pulse Rate: 111 (12/27 0300)  Labs: Recent Labs    12/05/20 0813 12/06/20 0337 12/06/20 1756 12/07/20 0148  HGB 12.1 13.1  --  13.4  HCT 36.6 40.0  --  40.8  PLT 100* 127*  --  134*  APTT  --   --  27  --   LABPROT  --   --  15.6*  --   INR  --   --  1.3*  --   HEPARINUNFRC  --   --   --  1.66*  CREATININE 1.02* 1.24* 1.47* 1.34*    Estimated Creatinine Clearance: 42.7 mL/min (A) (by C-G formula based on SCr of 1.34 mg/dL (H)).   Medical History: Past Medical History:  Diagnosis Date  . Arthritis   . Depression   . GERD (gastroesophageal reflux disease)   . Hypertension   . Thyroid disease   . Tobacco abuse     Assessment: [name] is a 69 y.o. female admitted on 11/27/2020 with COVID pneumonia. D-dimer increased to > 20 on 12/21. CTa chest on 12/21 negative for PE. Lower extremity venous duplex on 12/22 negative for DVT. Developed acute encephalopathy 12/26 and lactic acidosis requiring transfer to ICU. CTa chest today + for acute PE within posterior segmental branches of the left lower lobe, no evidence of RV strain. Pharmacy consulted for IV heparin dosing. Patient has been on Enoxaparin 40mg  SQ q24h for VTE prophylaxis-last dose this morning at 1033. CBC: Hgb WNL at 13.1. Pltc slightly low but improved to 127K.   12/07/20  Heparin level = 1.66 (supratherapeutic) with heparin infusing @ 1250 units/hr  Hgb WNL, PLTC low but stable  No bleeding or line issues noted per RN  Goal of Therapy:  Heparin level 0.3-0.7 units/ml Monitor platelets by anticoagulation  protocol: Yes   Plan:  Hold heparin infusion x 1 hr, then restart @ 1050 units/hr Heparin level 8 hours after rate decreased Daily CBC, heparin level Monitor closely for s/sx of bleeding   , PharmD 12/07/2020,4:03 AM

## 2020-12-07 NOTE — Progress Notes (Signed)
EEG Completed; Results Pending  

## 2020-12-07 NOTE — Evaluation (Signed)
SLP Cancellation Note  Patient Details Name: [name] MRN: [id number] DOB: 02-Jan-1951   Cancelled treatment:       Reason Eval/Treat Not Completed: Medical issues which prohibited therapy (RN reports pt desaturates into the 70s with any movement therefore, not appropriate for po, Will continue efforts.)   Chales Abrahams 12/07/2020, 10:12 AM   Rolena Infante, MS [facility] SLP Acute Rehab Services Office [phone] Pager [phone]

## 2020-12-07 NOTE — Progress Notes (Addendum)
eLink Physician-Brief Progress Note Patient Name: [name] DOB: 11/16/1951 MRN: [id number]   Date of Service  12/07/2020  HPI/Events of Note  ABG on 100%/PRVC 18/TV 490/p 8 = 7.40/35.4/68/21.9.  eICU Interventions  Plan: 1. Increase PEEP to 10 2. Wean FiO2 as tolerated.      Intervention Category Major Interventions: Respiratory failure - evaluation and management  Draper Gallon Eugene 12/07/2020, 11:35 PM

## 2020-12-07 NOTE — Progress Notes (Signed)
eLink Physician-Brief Progress Note Patient Name: [name] DOB: 03-Jun-1951 MRN: [id number]   Date of Service  12/07/2020  HPI/Events of Note  Review of CXR reveals ETT tip to be 3 cm above carina. ETT position is satisfactory. Progression of diffuse interstitial opacity c/w COVID pneumonia also noted.   eICU Interventions  Continue present management.      Intervention Category Major Interventions: Other:  Lysle Dingwall 12/07/2020, 10:05 PM

## 2020-12-07 NOTE — Procedures (Signed)
Patient Name: [name]  MRN: [id number]  Epilepsy Attending: Charlsie Quest  Referring Physician/Provider: Dr Lynden Oxford Date: 12/07/2020 Duration: 23.05 mins  Patient history: 69yo  With prior L PO encephalomalacia now with ams. EEG to evaluate for seizure  Level of alertness: Awake  AEDs during EEG study: LEv  Technical aspects: This EEG study was done with scalp electrodes positioned according to the 10-20 International system of electrode placement. Electrical activity was acquired at a sampling rate of 500Hz  and reviewed with a high frequency filter of 70Hz  and a low frequency filter of 1Hz . EEG data were recorded continuously and digitally stored.   Description: No clear posterior dominant rhythm was seen.  EEG showed continuous generalized mixed frequencies with predominatly 6-8hz  theta-alpha activity as well as intermittent 2-3Hz  delta slowing.  Hyperventilation and photic stimulation were not performed.     ABNORMALITY -Continuous slow, generalized  IMPRESSION: This study is suggestive of moderate diffuse encephalopathy, nonspecific etiology. No seizures or epileptiform discharges were seen throughout the recording.  Drago Hammonds 

## 2020-12-07 NOTE — Progress Notes (Signed)
PT Cancellation Note  Patient Details Name: [name] MRN: [id number] DOB: 08/01/1951   Cancelled Treatment:    Reason Eval/Treat Not Completed: Medical issues which prohibited therapy   Rada Hay 12/07/2020, 8:16 AM Blanchard Kelch PT Acute Rehabilitation Services Pager [phone] Office [phone]

## 2020-12-07 NOTE — Anesthesia Procedure Notes (Signed)
Procedure Name: Intubation Date/Time: 12/07/2020 9:12 PM Performed by: Rohan Juenger D, CRNA Pre-anesthesia Checklist: Patient identified, Emergency Drugs available, Suction available and Patient being monitored Patient Re-evaluated:Patient Re-evaluated prior to induction Oxygen Delivery Method: Supernova nasal CPAP Preoxygenation: Pre-oxygenation with 100% oxygen Induction Type: IV induction and Rapid sequence Laryngoscope Size: Glidescope and 3 Grade View: Grade I Tube type: Oral Tube size: 7.5 mm Number of attempts: 1 Airway Equipment and Method: Stylet Placement Confirmation: ETT inserted through vocal cords under direct vision,  positive ETCO2 and breath sounds checked- equal and bilateral Secured at: 22 cm Tube secured with: Tape Dental Injury: Teeth and Oropharynx as per pre-operative assessment

## 2020-12-07 NOTE — Progress Notes (Signed)
Pts. HFNC left in room after Intabated.

## 2020-12-07 NOTE — Progress Notes (Signed)
NAME:  [name], MRN:  [id number], DOB:  [birth year], LOS: 8 ADMISSION DATE:  12/19/2020, CONSULTATION DATE: 12/06/2020 REFERRING MD: Dr. Posey Pronto, [facility] hospitalist, CHIEF COMPLAINT: Encephalopathy, hypoxia   Brief History:  69 year old smoker admitted 12/19 with acute hypoxic respiratory failure due to COVID 19 viral pneumonia.  Has been on nonrebreather and high flow nasal cannula. Developed acute encephalopathy 12/26 and lactic acidosis requiring transfer to ICU  History of Present Illness:  She was admitted 12/19 for severe hypoxia due to Covid pneumonia.  CT angiogram did not show PE.  She was treated with oxygen, remdesivir, steroids, baricitinib and Actemra.  She was on Lovenox for DVT prophylaxis.  Mental status change was noted on 12/26 where she was only oriented to self and repeating herself.  She had an episode of desaturation when she took her oxygen mask off and fecal incontinence.  Oxygen saturations recovered.  She was transferred to ICU .  No seizure activity was noted.  Lactate was found to be 7.9 Head and CT chest was obtained and she was transferred to the ICU  Past Medical History:   Past Medical History:  Diagnosis Date  . Arthritis   . Depression   . GERD (gastroesophageal reflux disease)   . Hypertension   . Thyroid disease   . Tobacco abuse     Significant Hospital Events:   12/19 hospital admission 12/26 transferred to ICU  Consults:  PCCM-12/26  Procedures:    Significant Diagnostic Tests:  Head CT 12/26 encephalomalacia related to remote infarcts in left parietal occipital, right occipital and left temporal lobe -new compared to 2014.  CT angiogram chest 12/26 acute pulmonary emboli within segmental branches of left lower lobe , small clot burden, no evidence of RV strain  CT angiogram chest 12/21 no evidence of pulmonary embolus, diffuse interlobular septal thickening with GGO especially basis  Micro Data:  Blood culture 12/19 1/4  staff hominis  Antimicrobials:  Zosyn 12/26>> Vancomycin 12/26>>  Interim History / Subjective:  Remains encephalopathic Follows one-step commands Not answering questions appropriately Groaning  Objective   Blood pressure (!) 153/85, pulse 96, temperature 98.9 F (37.2 C), temperature source Oral, resp. rate (!) 32, height 5\' 7"  (1.702 m), weight 75.9 kg, SpO2 94 %.    FiO2 (%):  [100 %] 100 %   Intake/Output Summary (Last 24 hours) at 12/07/2020 0850 Last data filed at 12/07/2020 0600 Gross per 24 hour  Intake 505.53 ml  Output 200 ml  Net 305.53 ml   Filed Weights   Dec 18, 2020 1935 12/01/20 1426  Weight: 82.6 kg 75.9 kg    Examination: General: Elderly lady, tachypneic, on nasal cannula and nonrebreather  HENT: Dry oral mucosa Lungs: Decreased air movement bilaterally Cardiovascular: S1-S2 appreciated, no murmur Abdomen: Soft, bowel sounds appreciated Extremities: No clubbing, no edema Neuro: Arouses to name calling, opens eyes to commands, not oriented to place and time, not able to follow complex commands or answer questions GU: Fair output  CT chest reviewed by myself showing extensive interlobular septal thickening, groundglass changes  Resolved Hospital Problem list     Assessment & Plan:  Acute hypoxic respiratory failure due to Covid pneumonia -Continue oxygen supplementation, nonrebreather with high flow nasal cannula -Remains able to maintain her airway -Continuing steroids  Pulmonary embolism -On full dose anticoagulation  Acute encephalopathy -EEG ordered -Empiric Keppra -No seizures witnessed however patient was found to have fecal incontinence and remains encephalopathic  Acute COVID-19 viral pneumonia -On baricitinib and steroids  Chronic  CVA -Family stated no prior history of CVA -CT head showing areas of encephalomalacia consistent with chronic CVA in the left parieto-occipital region  Transaminitis -Continue to monitor  closely  Steroid-induced hyperglycemia -Continue sliding scale insulin  Hypothyroidism -Continue Synthroid  Acute kidney injury -Trend electrolytes -Avoid nephrotoxic's  Risk of decompensation remains very high Potentially may need intubation  Best practice (evaluated daily)  Diet: Remains n.p.o. Pain/Anxiety/Delirium protocol (if indicated):  VAP protocol (if indicated): Not applicable DVT prophylaxis: On full dose anticoagulation GI prophylaxis: Not applicable Glucose control: SSI Mobility: Bedrest Disposition: ICU  Goals of Care:  Last date of multidisciplinary goals of care discussion: Per [facility] Code Status: Full code  Labs   CBC: Recent Labs  Lab 12/03/20 0513 12/04/20 0456 12/05/20 0813 12/06/20 0337 12/07/20 0148  WBC 21.0* 18.7* 22.5* 21.9* 25.0*  NEUTROABS 13.2* 12.7* 17.9* 18.3* 22.6*  HGB 12.3 11.8* 12.1 13.1 13.4  HCT 37.8 36.0 36.6 40.0 40.8  MCV 109.6* 106.2* 105.8* 108.7* 108.8*  PLT 164 125* 100* 127* 134*    Basic Metabolic Panel: Recent Labs  Lab 12/01/20 1300 12/02/20 0519 12/03/20 0513 12/04/20 0456 12/05/20 0813 12/06/20 0337 12/06/20 1756 12/07/20 0148  NA 143 142 145 146* 145 149* 150* 151*  K 4.2 4.3 4.1 4.0 4.3 4.4 4.4 3.7  CL 107 107 111 112* 113* 110 112* 114*  CO2 23 22 22 22  21* 26 22 22   GLUCOSE 152* 168* 162* 153* 178* 200* 193* 224*  BUN 28* 35* 36* 36* 37* 37* 43* 39*  CREATININE 0.92 0.97 1.09* 0.99 1.02* 1.24* 1.47* 1.34*  CALCIUM 8.6* 8.9 9.0 9.1 9.2 10.0 9.7 9.7  MG 2.3 2.5* 2.6* 2.6* 2.6* 2.7*  --  2.5*  PHOS 2.4* 3.9 3.3 3.2  --   --   --   --    GFR: Estimated Creatinine Clearance: 42.7 mL/min (A) (by C-G formula based on SCr of 1.34 mg/dL (H)). Recent Labs  Lab 12/04/20 0456 12/05/20 0813 12/06/20 0337 12/06/20 1323 12/06/20 1610 12/07/20 0148  PROCALCITON  --   --   --  0.12  --  1.50  WBC 18.7* 22.5* 21.9*  --   --  25.0*  LATICACIDVEN  --   --   --  7.9* 5.1*  --     Liver Function  Tests: Recent Labs  Lab 12/04/20 0456 12/05/20 0813 12/06/20 0337 12/06/20 1756 12/07/20 0148  AST 70* 105* 138* 142* 158*  ALT 40 63* 100* 121* 123*  ALKPHOS 117 135* 134* 137* 117  BILITOT 1.1 1.4* 1.5* 1.9* 2.8*  PROT 6.8 7.2 7.7 7.8 7.5  ALBUMIN 3.3* 3.3* 3.5 3.5 3.4*   No results for input(s): LIPASE, AMYLASE in the last 168 hours. Recent Labs  Lab 12/06/20 1323  AMMONIA 20    ABG    Component Value Date/Time   PHART 7.540 (H) 12/06/2020 1230   PCO2ART 26.1 (L) 12/06/2020 1230   PO2ART 57.9 (L) 12/06/2020 1230   HCO3 22.3 12/06/2020 1230   TCO2 25 04/01/2011 0137   O2SAT 89.4 12/06/2020 1230     Coagulation Profile: Recent Labs  Lab 12/06/20 1756  INR 1.3*    Cardiac Enzymes: No results for input(s): CKTOTAL, CKMB, CKMBINDEX, TROPONINI in the last 168 hours.  HbA1C: Hgb A1c MFr Bld  Date/Time Value Ref Range Status  11/30/2020 05:21 AM 4.9 4.8 - 5.6 % Final    Comment:    (NOTE) Pre diabetes:  5.7%-6.4%  Diabetes:              >6.4%  Glycemic control for   <7.0% adults with diabetes     CBG: Recent Labs  Lab 12/06/20 1135 12/06/20 2041 12/07/20 0045 12/07/20 0548 12/07/20 0812  GLUCAP 188* 163* 167* 170* 199*    Review of Systems:   Encephalopathic  Past Medical History:  She,  has a past medical history of Arthritis, Depression, GERD (gastroesophageal reflux disease), Hypertension, Thyroid disease, and Tobacco abuse.   Surgical History:   Past Surgical History:  Procedure Laterality Date  . CESAREAN SECTION    . EYE SURGERY       Social History:   reports that she has been smoking cigarettes. She has a 20.00 pack-year smoking history. She has never used smokeless tobacco. She reports current alcohol use. She reports current drug use. Drug: Marijuana.   Family History:  Her family history includes Breast cancer (age of onset: 53) in her mother; Esophageal cancer in her father; Esophageal cancer (age of onset: 69) in  her brother; Healthy in her daughter; Throat cancer in her brother. There is no history of Colon cancer.   Allergies No Known Allergies   The patient is critically ill with multiple organ systems failure and requires high complexity decision making for assessment and support, frequent evaluation and titration of therapies, application of advanced monitoring technologies and extensive interpretation of multiple databases. Critical Care Time devoted to patient care services described in this note independent of APP/resident time (if applicable)  is 35 minutes.   Virl Diamond MD [facility] Personal pager: [phone] If unanswered, please page CCM On-call: #[phone]

## 2020-12-07 NOTE — Progress Notes (Signed)
ANTICOAGULATION CONSULT NOTE - Follow Up Consult  Pharmacy Consult for IV heparin Indication: pulmonary embolus  No Known Allergies  Patient Measurements: Height: 5\' 7"  (170.2 cm) Weight: 75.9 kg (167 lb 5.3 oz) IBW/kg (Calculated) : 61.6 Heparin Dosing Weight: actual body weight   Vital Signs: Temp: 98.7 F (37.1 C) (12/27 1200) Temp Source: Oral (12/27 1200) BP: 147/100 (12/27 1500) Pulse Rate: 97 (12/27 1500)  Labs: Recent Labs    12/05/20 0813 12/06/20 0337 12/06/20 1756 12/07/20 0148 12/07/20 1410  HGB 12.1 13.1  --  13.4  --   HCT 36.6 40.0  --  40.8  --   PLT 100* 127*  --  134*  --   APTT  --   --  27  --   --   LABPROT  --   --  15.6*  --   --   INR  --   --  1.3*  --   --   HEPARINUNFRC  --   --   --  1.66* 1.06*  CREATININE 1.02* 1.24* 1.47* 1.34*  --     Estimated Creatinine Clearance: 42.7 mL/min (A) (by C-G formula based on SCr of 1.34 mg/dL (H)).   Medical History: Past Medical History:  Diagnosis Date  . Arthritis   . Depression   . GERD (gastroesophageal reflux disease)   . Hypertension   . Thyroid disease   . Tobacco abuse     Assessment: [name] is a 69 y.o. female admitted on 30-Nov-2020 with COVID pneumonia. D-dimer increased to > 20 on 12/21. CTa chest on 12/21 negative for PE. Lower extremity venous duplex on 12/22 negative for DVT. Developed acute encephalopathy 12/26 and lactic acidosis requiring transfer to ICU. CTa chest today + for acute PE within posterior segmental branches of the left lower lobe, no evidence of RV strain. Pharmacy consulted for IV heparin dosing. Patient has been on Enoxaparin 40mg  SQ q24h for VTE prophylaxis  12/07/20  Heparin level = 1.06 (supratherapeutic) after heparin held for one hour then rate decreased to 1050 units/hr  Hgb WNL, PLTC low but stable  No bleeding or line issues noted per RN  Goal of Therapy:  Heparin level 0.3-0.7 units/ml Monitor platelets by anticoagulation protocol:  Yes   Plan:  Hold heparin infusion x 1 hr, then restart @ 850 units/hr Heparin level 8 hours after rate decreased Daily CBC, heparin level Monitor closely for s/sx of bleeding   , PharmD, BCPS Pharmacy: [phone] 12/07/2020,4:16 PM
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
## 2020-12-07 NOTE — Progress Notes (Addendum)
eLink Physician-Brief Progress Note Patient Name: [name] DOB: March 29, 1951 MRN: [id number]   Date of Service  12/07/2020  HPI/Events of Note  Hypoxic respiratory failure d/t COVID pneumonia. Sats dropped into low 80's high 70's on HFNC + NRM. Nursing also reports that the patient's WOB is increased and the patient is more confused.   eICU Interventions  Plan: 1. Anesthesia to intubate the patient.  2. Ventilator settings: 100%/PRVC 18/TV 450/P 10 3. ABG 1 hour post intubation.  4. Portable CXR STAT - post intubation.  5. Propofol IV infusion. Titrate to RASS = 0 to -1.     Intervention Category Major Interventions: Respiratory failure - evaluation and management;Hypoxemia - evaluation and management  Lenell Antu 12/07/2020, 8:45 PM

## 2020-12-08 ENCOUNTER — Inpatient Hospital Stay (HOSPITAL_COMMUNITY): Payer: Medicare Other

## 2020-12-08 ENCOUNTER — Inpatient Hospital Stay: Payer: Self-pay

## 2020-12-08 DIAGNOSIS — J9600 Acute respiratory failure, unspecified whether with hypoxia or hypercapnia: Secondary | ICD-10-CM | POA: Diagnosis not present

## 2020-12-08 DIAGNOSIS — U071 COVID-19: Secondary | ICD-10-CM | POA: Diagnosis not present

## 2020-12-08 LAB — CBC WITH DIFFERENTIAL/PLATELET
Abs Immature Granulocytes: 0.51 10*3/uL — ABNORMAL HIGH (ref 0.00–0.07)
Basophils Absolute: 0.1 10*3/uL (ref 0.0–0.1)
Basophils Relative: 0 %
Eosinophils Absolute: 0.2 10*3/uL (ref 0.0–0.5)
Eosinophils Relative: 1 %
HCT: 36.6 % (ref 36.0–46.0)
Hemoglobin: 11.5 g/dL — ABNORMAL LOW (ref 12.0–15.0)
Immature Granulocytes: 2 %
Lymphocytes Relative: 4 %
Lymphs Abs: 1.1 10*3/uL (ref 0.7–4.0)
MCH: 35 pg — ABNORMAL HIGH (ref 26.0–34.0)
MCHC: 31.4 g/dL (ref 30.0–36.0)
MCV: 111.2 fL — ABNORMAL HIGH (ref 80.0–100.0)
Monocytes Absolute: 0.9 10*3/uL (ref 0.1–1.0)
Monocytes Relative: 3 %
Neutro Abs: 24 10*3/uL — ABNORMAL HIGH (ref 1.7–7.7)
Neutrophils Relative %: 90 %
Platelets: 164 10*3/uL (ref 150–400)
RBC: 3.29 MIL/uL — ABNORMAL LOW (ref 3.87–5.11)
RDW: 13.5 % (ref 11.5–15.5)
WBC: 26.7 10*3/uL — ABNORMAL HIGH (ref 4.0–10.5)
nRBC: 0.3 % — ABNORMAL HIGH (ref 0.0–0.2)

## 2020-12-08 LAB — BLOOD GAS, ARTERIAL
Acid-base deficit: 2.7 mmol/L — ABNORMAL HIGH (ref 0.0–2.0)
Acid-base deficit: 4.8 mmol/L — ABNORMAL HIGH (ref 0.0–2.0)
Bicarbonate: 21.1 mmol/L (ref 20.0–28.0)
Bicarbonate: 21.5 mmol/L (ref 20.0–28.0)
Drawn by: 560031
FIO2: 100
MECHVT: 490 mL
O2 Saturation: 88.6 %
O2 Saturation: 94.7 %
PEEP: 10 cmH2O
Patient temperature: 98.2
Patient temperature: 98.1
RATE: 18 resp/min
pCO2 arterial: 34.2 mmHg (ref 32.0–48.0)
pCO2 arterial: 47.6 mmHg (ref 32.0–48.0)
pH, Arterial: 7.405 (ref 7.350–7.450)
pH, Arterial: 7.274 — ABNORMAL LOW (ref 7.350–7.450)
pO2, Arterial: 62 mmHg — ABNORMAL LOW (ref 83.0–108.0)
pO2, Arterial: 87.1 mmHg (ref 83.0–108.0)

## 2020-12-08 LAB — COMPREHENSIVE METABOLIC PANEL
ALT: 91 U/L — ABNORMAL HIGH (ref 0–44)
AST: 66 U/L — ABNORMAL HIGH (ref 15–41)
Albumin: 3 g/dL — ABNORMAL LOW (ref 3.5–5.0)
Alkaline Phosphatase: 108 U/L (ref 38–126)
Anion gap: 12 (ref 5–15)
BUN: 39 mg/dL — ABNORMAL HIGH (ref 8–23)
CO2: 22 mmol/L (ref 22–32)
Calcium: 9.1 mg/dL (ref 8.9–10.3)
Chloride: 114 mmol/L — ABNORMAL HIGH (ref 98–111)
Creatinine, Ser: 1.36 mg/dL — ABNORMAL HIGH (ref 0.44–1.00)
GFR, Estimated: 42 mL/min — ABNORMAL LOW (ref >60)
Glucose, Bld: 215 mg/dL — ABNORMAL HIGH (ref 70–99)
Potassium: 3.6 mmol/L (ref 3.5–5.1)
Sodium: 148 mmol/L — ABNORMAL HIGH (ref 135–145)
Total Bilirubin: 1.5 mg/dL — ABNORMAL HIGH (ref 0.3–1.2)
Total Protein: 6.9 g/dL (ref 6.5–8.1)

## 2020-12-08 LAB — GLUCOSE, CAPILLARY
Glucose-Capillary: 156 mg/dL — ABNORMAL HIGH (ref 70–99)
Glucose-Capillary: 169 mg/dL — ABNORMAL HIGH (ref 70–99)
Glucose-Capillary: 188 mg/dL — ABNORMAL HIGH (ref 70–99)
Glucose-Capillary: 220 mg/dL — ABNORMAL HIGH (ref 70–99)
Glucose-Capillary: 235 mg/dL — ABNORMAL HIGH (ref 70–99)
Glucose-Capillary: 313 mg/dL — ABNORMAL HIGH (ref 70–99)

## 2020-12-08 LAB — PROCALCITONIN: Procalcitonin: 1.22 ng/mL

## 2020-12-08 LAB — T.PALLIDUM AB, TOTAL: T Pallidum Abs: REACTIVE — AB

## 2020-12-08 LAB — D-DIMER, QUANTITATIVE: D-Dimer, Quant: 6.81 ug/mL-FEU — ABNORMAL HIGH (ref 0.00–0.50)

## 2020-12-08 LAB — C-REACTIVE PROTEIN: CRP: 25.3 mg/dL — ABNORMAL HIGH (ref <1.0)

## 2020-12-08 LAB — HEPARIN LEVEL (UNFRACTIONATED)
Heparin Unfractionated: 0.68 IU/mL (ref 0.30–0.70)
Heparin Unfractionated: 0.53 IU/mL (ref 0.30–0.70)

## 2020-12-08 LAB — MAGNESIUM: Magnesium: 3 mg/dL — ABNORMAL HIGH (ref 1.7–2.4)

## 2020-12-08 LAB — FERRITIN: Ferritin: >7500 ng/mL — ABNORMAL HIGH (ref 11–307)

## 2020-12-08 MED ORDER — THIAMINE HCL 100 MG/ML IJ SOLN
100.0000 mg | Freq: Every day | INTRAMUSCULAR | Status: DC
  Administered 2020-12-08 – 2020-12-13 (×2): 100 mg via INTRAVENOUS
  Filled 2020-12-08 (×2): qty 2

## 2020-12-08 MED ORDER — FOLIC ACID 1 MG PO TABS
1.0000 mg | ORAL_TABLET | Freq: Every day | ORAL | Status: DC
  Administered 2020-12-08 – 2020-12-18 (×11): 1 mg
  Filled 2020-12-08 (×11): qty 1

## 2020-12-08 MED ORDER — LIP MEDEX EX OINT
TOPICAL_OINTMENT | CUTANEOUS | Status: DC | PRN
  Administered 2020-12-08 – 2020-12-10 (×5): 1 via TOPICAL
  Filled 2020-12-08: qty 7

## 2020-12-08 MED ORDER — FENTANYL 2500MCG IN NS 250ML (10MCG/ML) PREMIX INFUSION
0.0000 ug/h | INTRAVENOUS | Status: DC
  Administered 2020-12-08: 200 ug/h via INTRAVENOUS
  Administered 2020-12-08: 07:00:00 25 ug/h via INTRAVENOUS
  Administered 2020-12-09 (×2): 300 ug/h via INTRAVENOUS
  Administered 2020-12-09: 23:00:00 275 ug/h via INTRAVENOUS
  Administered 2020-12-10: 250 ug/h via INTRAVENOUS
  Administered 2020-12-10: 09:00:00 225 ug/h via INTRAVENOUS
  Administered 2020-12-11: 200 ug/h via INTRAVENOUS
  Filled 2020-12-08 (×8): qty 250

## 2020-12-08 MED ORDER — PHENYLEPHRINE CONCENTRATED 100MG/250ML (0.4 MG/ML) INFUSION SIMPLE
25.0000 ug/min | INTRAVENOUS | Status: DC
  Administered 2020-12-08: 125 ug/min via INTRAVENOUS
  Filled 2020-12-08: qty 250

## 2020-12-08 MED ORDER — SODIUM CHLORIDE 0.9% FLUSH: 10.0000 mL | INTRAVENOUS | Status: DC | PRN

## 2020-12-08 MED ORDER — ARTIFICIAL TEARS OPHTHALMIC OINT
TOPICAL_OINTMENT | OPHTHALMIC | Status: DC | PRN
  Filled 2020-12-08: qty 3.5

## 2020-12-08 MED ORDER — SODIUM CHLORIDE 0.9% FLUSH
10.0000 mL | Freq: Two times a day (BID) | INTRAVENOUS | Status: DC
  Administered 2020-12-08 – 2020-12-11 (×7): 10 mL
  Administered 2020-12-12: 20 mL
  Administered 2020-12-12 – 2020-12-14 (×5): 10 mL
  Administered 2020-12-15 (×2): 20 mL
  Administered 2020-12-16 – 2020-12-18 (×5): 10 mL

## 2020-12-08 MED ORDER — NOREPINEPHRINE 4 MG/250ML-% IV SOLN
0.0000 ug/min | INTRAVENOUS | Status: DC
  Administered 2020-12-08: 21:00:00 14 ug/min via INTRAVENOUS
  Administered 2020-12-08 – 2020-12-09 (×2): 10 ug/min via INTRAVENOUS
  Administered 2020-12-09: 18:00:00 4 ug/min via INTRAVENOUS
  Administered 2020-12-09: 02:00:00 13 ug/min via INTRAVENOUS
  Administered 2020-12-10: 09:00:00 4 ug/min via INTRAVENOUS
  Administered 2020-12-10: 21:00:00 5 ug/min via INTRAVENOUS
  Filled 2020-12-08 (×4): qty 250
  Filled 2020-12-08: qty 500
  Filled 2020-12-08 (×2): qty 250

## 2020-12-08 MED ORDER — ATROPINE SULFATE 1 MG/10ML IJ SOSY
PREFILLED_SYRINGE | INTRAMUSCULAR | Status: AC
  Filled 2020-12-08: qty 10

## 2020-12-08 MED ORDER — ACETAMINOPHEN 160 MG/5ML PO SOLN
650.0000 mg | Freq: Four times a day (QID) | ORAL | Status: DC | PRN
  Administered 2020-12-17: 650 mg
  Filled 2020-12-08: qty 20.3

## 2020-12-08 MED ORDER — ATROPINE SULFATE 0.4 MG/ML IJ SOLN
0.4000 mg | INTRAMUSCULAR | Status: DC | PRN
  Administered 2020-12-08: 15:00:00 0.4 mg via INTRAVENOUS
  Filled 2020-12-08 (×3): qty 1

## 2020-12-08 MED ORDER — SODIUM CHLORIDE 0.9 % IV BOLUS
1000.0000 mL | Freq: Once | INTRAVENOUS | Status: AC
  Administered 2020-12-08: 04:00:00 1000 mL via INTRAVENOUS

## 2020-12-08 MED ORDER — VECURONIUM BROMIDE 10 MG IV SOLR
10.0000 mg | INTRAVENOUS | Status: DC | PRN
  Administered 2020-12-08 – 2020-12-18 (×14): 10 mg via INTRAVENOUS
  Filled 2020-12-08 (×16): qty 10

## 2020-12-08 MED ORDER — THIAMINE HCL 100 MG PO TABS
100.0000 mg | ORAL_TABLET | Freq: Every day | ORAL | Status: DC
  Administered 2020-12-09 – 2020-12-18 (×9): 100 mg
  Filled 2020-12-08 (×9): qty 1

## 2020-12-08 MED ORDER — MIDAZOLAM HCL 2 MG/2ML IJ SOLN
1.0000 mg | INTRAMUSCULAR | Status: DC | PRN
  Administered 2020-12-09: 02:00:00 1 mg via INTRAVENOUS
  Administered 2020-12-10 – 2020-12-14 (×11): 2 mg via INTRAVENOUS
  Filled 2020-12-08 (×12): qty 2

## 2020-12-08 MED ORDER — SODIUM CHLORIDE 0.9 % IV SOLN
250.0000 mL | INTRAVENOUS | Status: DC
  Administered 2020-12-08 – 2020-12-17 (×2): 250 mL via INTRAVENOUS

## 2020-12-08 MED ORDER — ADULT MULTIVITAMIN LIQUID CH
15.0000 mL | Freq: Every day | ORAL | Status: DC
  Administered 2020-12-08 – 2020-12-18 (×11): 15 mL
  Filled 2020-12-08 (×11): qty 15

## 2020-12-08 MED ORDER — PHENYLEPHRINE HCL-NACL 10-0.9 MG/250ML-% IV SOLN
25.0000 ug/min | INTRAVENOUS | Status: DC
  Administered 2020-12-08: 04:00:00 25 ug/min via INTRAVENOUS
  Administered 2020-12-08: 10:00:00 150 ug/min via INTRAVENOUS
  Filled 2020-12-08: qty 250
  Filled 2020-12-08: qty 500
  Filled 2020-12-08: qty 250

## 2020-12-08 NOTE — Progress Notes (Signed)
ANTICOAGULATION CONSULT NOTE - Follow Up Consult  Pharmacy Consult for IV heparin Indication: pulmonary embolus  No Known Allergies  Patient Measurements: Height: 5\' 7"  (170.2 cm) Weight: 71 kg (156 lb 8.4 oz) IBW/kg (Calculated) : 61.6 Heparin Dosing Weight: actual body weight   Vital Signs: Temp: 100.94 F (38.3 C) (12/28 1000) Temp Source: Bladder (12/28 0800) BP: 84/58 (12/28 1000) Pulse Rate: 75 (12/28 1000)  Labs: Recent Labs    12/06/20 0337 12/06/20 0337 12/06/20 1756 12/07/20 0148 12/07/20 1410 12/07/20 1858 12/08/20 0215 12/08/20 1017  HGB 13.1  --   --  13.4  --   --  11.5*  --   HCT 40.0  --   --  40.8  --   --  36.6  --   PLT 127*  --   --  134*  --   --  164  --   APTT  --   --  27  --   --   --   --   --   LABPROT  --   --  15.6*  --   --   --   --   --   INR  --   --  1.3*  --   --   --   --   --   HEPARINUNFRC  --    < >  --  1.66* 1.06*  --  0.68 0.53  CREATININE 1.24*  --  1.47* 1.34*  --  1.14* 1.36*  --    < > = values in this interval not displayed.   Estimated Creatinine Clearance: 38.5 mL/min (A) (by C-G formula based on SCr of 1.36 mg/dL (H)).  Medical History: Past Medical History:  Diagnosis Date  . Arthritis   . Depression   . GERD (gastroesophageal reflux disease)   . Hypertension   . Thyroid disease   . Tobacco abuse    Assessment: [name] is a 69 y.o. female admitted on 2020/12/16 with COVID pneumonia. D-dimer increased to > 20 on 12/21. CTa chest on 12/21 negative for PE. Lower extremity venous duplex on 12/22 negative for DVT. Developed acute encephalopathy 12/26 and lactic acidosis requiring transfer to ICU. CTa chest today + for acute PE within posterior segmental branches of the left lower lobe, no evidence of RV strain. Pharmacy consulted for IV heparin dosing. Patient has been on Enoxaparin 40mg  SQ q24h for VTE prophylaxis  12/08/20  Heparin level = 0.53 on 850 units/hr, remains in therapeutic range  Hgb down  to 11.5, plts WNL  No bleeding or line issues noted per RN  Goal of Therapy:  Heparin level 0.3-0.7 units/ml Monitor platelets by anticoagulation protocol: Yes   Plan:  Continue heparin drip at 850 units/hr Daily CBC, heparin level Monitor closely for s/sx of bleeding  PharmD 12/08/2020, 11:01 AM

## 2020-12-08 NOTE — Progress Notes (Signed)
NAME:  [name], MRN:  [id number], DOB:  [birth year], LOS: 9 ADMISSION DATE:  2020-12-04, CONSULTATION DATE: 12/06/2020 REFERRING MD: Dr. Posey Pronto, [facility], CHIEF COMPLAINT: Encephalopathy, hypoxia   Brief History:  69 year old smoker admitted 12/19 with acute hypoxic respiratory failure due to COVID 19 viral pneumonia.  Has been on nonrebreather and high flow nasal cannula. Developed acute encephalopathy 12/26 and lactic acidosis requiring transfer to ICU  History of Present Illness:  She was admitted 12/19 for severe hypoxia due to Covid pneumonia.  CT angiogram did not show PE.  She was treated with oxygen, remdesivir, steroids, baricitinib and Actemra.  She was on Lovenox for DVT prophylaxis.  Mental status change was noted on 12/26 where she was only oriented to self and repeating herself.  She had an episode of desaturation when she took her oxygen mask off and fecal incontinence.  Oxygen saturations recovered.  She was transferred to ICU .  No seizure activity was noted.  Lactate was found to be 7.9 Head and CT chest was obtained and she was transferred to the ICU  Past Medical History:   Past Medical History:  Diagnosis Date  . Arthritis   . Depression   . GERD (gastroesophageal reflux disease)   . Hypertension   . Thyroid disease   . Tobacco abuse     Significant Hospital Events:   12/19 hospital admission 12/26 transferred to ICU 12/27-intubated  Consults:  PCCM-12/26  Procedures:  Endotracheal intubation 12/27  Significant Diagnostic Tests:  Head CT 12/26 encephalomalacia related to remote infarcts in left parietal occipital, right occipital and left temporal lobe -new compared to 2014.  CT angiogram chest 12/26 acute pulmonary emboli within segmental branches of left lower lobe , small clot burden, no evidence of RV strain  CT angiogram chest 12/21 no evidence of pulmonary embolus, diffuse interlobular septal thickening with GGO especially  basis  Micro Data:  Blood culture 12/19 1/4 staff hominis  Antimicrobials:  Zosyn 12/26>> Vancomycin 12/26>>  Interim History / Subjective:  Intubated overnight for increased work of breathing and hypoxemia Periods of hypotension requiring pressors  Objective   Blood pressure 92/63, pulse 89, temperature (!) 100.4 F (38 C), resp. rate (!) 31, height 5\' 7"  (1.702 m), weight 71 kg, SpO2 (!) 88 %.    Vent Mode: PRVC FiO2 (%):  [100 %] 100 % Set Rate:  [18 bmp] 18 bmp Vt Set:  [490 mL] 490 mL PEEP:  [8 cmH20-10 cmH20] 10 cmH20   Intake/Output Summary (Last 24 hours) at 12/08/2020 0928 Last data filed at 12/08/2020 9798 Gross per 24 hour  Intake 2818.56 ml  Output 550 ml  Net 2268.56 ml   Filed Weights   2020/12/04 1935 12/01/20 1426 12/08/20 0400  Weight: 82.6 kg 75.9 kg 71 kg    Examination: General: Elderly lady, on ventilator HENT: Dry oral mucosa Lungs: Decreased air movement bilaterally, few rales at the bases Cardiovascular: S1-S2 appreciated with no murmur Abdomen: Soft, bowel sounds appreciated Extremities: No clubbing, no edema Neuro: Sedated GU: Fair output  CT chest reviewed by myself showing extensive interlobular septal thickening, groundglass changes  Resolved Hospital Problem list     Assessment & Plan:  Acute hypoxic respiratory failure due to Covid pneumonia -On ventilator -Continue mechanical ventilation per ARDS protocol Target TVol 6-8cc/kgIBW Target Plateau Pressure < 30cm H20 Target driving pressure less than 15 cm of water Target PaO2 55-65: titrate PEEP/FiO2 per protocol As long as PaO2 to FiO2 ratio is less than 1:150  position in prone position for 16 hours a day Ventilator associated pneumonia prevention protocol  Pulmonary embolism -On full dose anticoagulation  Encephalopathy -EEG negative for seizures -Empiric Keppra  Acute COVID-19 viral pneumonia -On baricitinib on steroids  Chronic CVA -Family stated no prior history  of CVA -CT showing areas of encephalomalacia consistent with chronic CVA in the left parieto-occipital region   Transaminitis -Continue to monitor closely  Hypothyroidism -Continue Synthroid  Acute kidney injury -Trend electrolytes -Avoid nephrotoxic's  Risk of decompensation remains very high  Requiring intermittent paralytics for hypoxemia  Prognosis is guarded  Best practice (evaluated daily)  Diet: Remains n.p.o. Pain/Anxiety/Delirium protocol (if indicated):  VAP protocol (if indicated): Not applicable DVT prophylaxis: On full dose anticoagulation GI prophylaxis: Not applicable Glucose control: SSI Mobility: Bedrest Disposition: ICU  Goals of Care:  Last date of multidisciplinary goals of care discussion: We will update family Code Status: Full code  Labs   CBC: Recent Labs  Lab 12/04/20 0456 12/05/20 0813 12/06/20 0337 12/07/20 0148 12/08/20 0215  WBC 18.7* 22.5* 21.9* 25.0* 26.7*  NEUTROABS 12.7* 17.9* 18.3* 22.6* 24.0*  HGB 11.8* 12.1 13.1 13.4 11.5*  HCT 36.0 36.6 40.0 40.8 36.6  MCV 106.2* 105.8* 108.7* 108.8* 111.2*  PLT 125* 100* 127* 134* 164    Basic Metabolic Panel: Recent Labs  Lab 12/01/20 1300 12/02/20 0519 12/03/20 0513 12/04/20 0456 12/05/20 0813 12/06/20 0337 12/06/20 1756 12/07/20 0148 12/07/20 1858 12/08/20 0215  NA 143 142 145 146* 145 149* 150* 151* 150* 148*  K 4.2 4.3 4.1 4.0 4.3 4.4 4.4 3.7 3.7 3.6  CL 107 107 111 112* 113* 110 112* 114* 114* 114*  CO2 23 22 22 22  21* 26 22 22  21* 22  GLUCOSE 152* 168* 162* 153* 178* 200* 193* 224* 259* 215*  BUN 28* 35* 36* 36* 37* 37* 43* 39* 37* 39*  CREATININE 0.92 0.97 1.09* 0.99 1.02* 1.24* 1.47* 1.34* 1.14* 1.36*  CALCIUM 8.6* 8.9 9.0 9.1 9.2 10.0 9.7 9.7 9.3 9.1  MG 2.3 2.5* 2.6* 2.6* 2.6* 2.7*  --  2.5*  --  3.0*  PHOS 2.4* 3.9 3.3 3.2  --   --   --   --   --   --    GFR: Estimated Creatinine Clearance: 38.5 mL/min (A) (by C-G formula based on SCr of 1.36 mg/dL  (H)). Recent Labs  Lab 12/05/20 0813 12/06/20 0337 12/06/20 1323 12/06/20 1610 12/07/20 0148 12/07/20 1858 12/07/20 2244 12/08/20 0215  PROCALCITON  --   --  0.12  --  1.50  --   --  1.22  WBC 22.5* 21.9*  --   --  25.0*  --   --  26.7*  LATICACIDVEN  --   --  7.9* 5.1*  --  4.2* 3.3*  --     Liver Function Tests: Recent Labs  Lab 12/05/20 0813 12/06/20 0337 12/06/20 1756 12/07/20 0148 12/08/20 0215  AST 105* 138* 142* 158* 66*  ALT 63* 100* 121* 123* 91*  ALKPHOS 135* 134* 137* 117 108  BILITOT 1.4* 1.5* 1.9* 2.8* 1.5*  PROT 7.2 7.7 7.8 7.5 6.9  ALBUMIN 3.3* 3.5 3.5 3.4* 3.0*   No results for input(s): LIPASE, AMYLASE in the last 168 hours. Recent Labs  Lab 12/06/20 1323  AMMONIA 20    ABG    Component Value Date/Time   PHART 7.405 12/08/2020 0742   PCO2ART 34.2 12/08/2020 0742   PO2ART 62.0 (L) 12/08/2020 0742   HCO3 21.1 12/08/2020 0742  TCO2 25 04/01/2011 0137   ACIDBASEDEF 2.7 (H) 12/08/2020 0742   O2SAT 88.6 12/08/2020 0742     Coagulation Profile: Recent Labs  Lab 12/06/20 1756  INR 1.3*    Cardiac Enzymes: No results for input(s): CKTOTAL, CKMB, CKMBINDEX, TROPONINI in the last 168 hours.  HbA1C: Hgb A1c MFr Bld  Date/Time Value Ref Range Status  11/30/2020 05:21 AM 4.9 4.8 - 5.6 % Final    Comment:    (NOTE) Pre diabetes:          5.7%-6.4%  Diabetes:              >6.4%  Glycemic control for   <7.0% adults with diabetes     CBG: Recent Labs  Lab 12/07/20 1612 12/07/20 1947 12/08/20 0025 12/08/20 0422 12/08/20 0858  GLUCAP 240* 232* 188* 169* 156*    Review of Systems:   Encephalopathic  Past Medical History:  She,  has a past medical history of Arthritis, Depression, GERD (gastroesophageal reflux disease), Hypertension, Thyroid disease, and Tobacco abuse.   Surgical History:   Past Surgical History:  Procedure Laterality Date  . CESAREAN SECTION    . EYE SURGERY       Social History:   reports that she has  been smoking cigarettes. She has a 20.00 pack-year smoking history. She has never used smokeless tobacco. She reports current alcohol use. She reports current drug use. Drug: Marijuana.   Family History:  Her family history includes Breast cancer (age of onset: 49) in her mother; Esophageal cancer in her father; Esophageal cancer (age of onset: 52) in her brother; Healthy in her daughter; Throat cancer in her brother. There is no history of Colon cancer.   Allergies No Known Allergies   The patient is critically ill with multiple organ systems failure and requires high complexity decision making for assessment and support, frequent evaluation and titration of therapies, application of advanced monitoring technologies and extensive interpretation of multiple databases. Critical Care Time devoted to patient care services described in this note independent of APP/resident time (if applicable)  is 32 minutes.   Virl Diamond MD [facility] Personal pager: [phone] If unanswered, please page CCM On-call: #[phone]

## 2020-12-08 NOTE — Progress Notes (Signed)
ABG sent to lab, lab notified

## 2020-12-08 NOTE — Progress Notes (Signed)
ANTICOAGULATION CONSULT NOTE - Follow Up Consult  Pharmacy Consult for IV heparin Indication: pulmonary embolus  No Known Allergies  Patient Measurements: Height: 5\' 7"  (170.2 cm) Weight: 71 kg (156 lb 8.4 oz) IBW/kg (Calculated) : 61.6 Heparin Dosing Weight: actual body weight   Vital Signs: Temp: 100.4 F (38 C) (12/28 0400) Temp Source: Core (12/28 0200) BP: 93/71 (12/28 0400) Pulse Rate: 107 (12/28 0400)  Labs: Recent Labs    12/06/20 0337 12/06/20 1756 12/07/20 0148 12/07/20 1410 12/07/20 1858 12/08/20 0215  HGB 13.1  --  13.4  --   --  11.5*  HCT 40.0  --  40.8  --   --  36.6  PLT 127*  --  134*  --   --  164  APTT  --  27  --   --   --   --   LABPROT  --  15.6*  --   --   --   --   INR  --  1.3*  --   --   --   --   HEPARINUNFRC  --   --  1.66* 1.06*  --  0.68  CREATININE 1.24* 1.47* 1.34*  --  1.14* 1.36*    Estimated Creatinine Clearance: 38.5 mL/min (A) (by C-G formula based on SCr of 1.36 mg/dL (H)).   Medical History: Past Medical History:  Diagnosis Date  . Arthritis   . Depression   . GERD (gastroesophageal reflux disease)   . Hypertension   . Thyroid disease   . Tobacco abuse     Assessment: [name] is a 69 y.o. female admitted on 16-Dec-2020 with COVID pneumonia. D-dimer increased to > 20 on 12/21. CTa chest on 12/21 negative for PE. Lower extremity venous duplex on 12/22 negative for DVT. Developed acute encephalopathy 12/26 and lactic acidosis requiring transfer to ICU. CTa chest today + for acute PE within posterior segmental branches of the left lower lobe, no evidence of RV strain. Pharmacy consulted for IV heparin dosing. Patient has been on Enoxaparin 40mg  SQ q24h for VTE prophylaxis  12/08/20  Heparin level = 0.68 therapeutic  Hgb down to 11.5, plts WNL  No bleeding or line issues noted per RN  Goal of Therapy:  Heparin level 0.3-0.7 units/ml Monitor platelets by anticoagulation protocol: Yes   Plan:  Continue  heparin drip at 850 units/hr Heparin level in 8 hours Daily CBC, heparin level Monitor closely for s/sx of bleeding   RPh 12/08/2020, 4:58 AM

## 2020-12-08 NOTE — Progress Notes (Signed)
  SLP Cancellation Note  Patient Details Name: [name] MRN: [id number] DOB: Jan 09, 1951   Cancelled treatment:       Reason Eval/Treat Not Completed: Medical issues which prohibited therapy;Patient not medically ready . Patient had to be intubated on 12/27. Please reorder SLP for swallow evaluation if/when extubated. Thank you for this consult!  Angela Nevin, MA, CCC-SLP Speech Therapy

## 2020-12-08 NOTE — Progress Notes (Signed)
eLink Physician-Brief Progress Note Patient Name: LYNDSAY TALAMANTE DOB: 02/08/1951 MRN: [id number]   Date of Service  12/08/2020  HPI/Events of Note  Hypotension - BP = 81/64 s/p nursing increasing Propofol IV infusion and giving Fentanyl 50 mcg IV. Can't increase Propofol or Fentanyl until BP improves.   eICU Interventions  Plan: 1. Bolus with 0.9 NaCl 1 liter IV over 1 hour now.  2. Phenylephrine IV infusion via PIC. Titrate to MAP >= 65.     Intervention Category Major Interventions: Hypotension - evaluation and management  Knox Holdman Eugene 12/08/2020, 3:23 AM

## 2020-12-08 NOTE — Progress Notes (Signed)
Patient's personal clothing, jewelries (bracelet, rings, necklace), watch, cellphone were placed on personal belonging bag and stored inside patient's room.
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
## 2020-12-08 NOTE — Progress Notes (Signed)
Pharmacy Antibiotic Note  [name] is a 69 y.o. female admitted on 12/16/2020 with COVID pneumonia.  Pharmacy has been consulted for vancomycin and zosyn dosing.  Plan: Vancomycin 1500mg  IV x 1, then 1g IV q24h (expected AUC 463 using SCr 1.24) Zosyn 3.375gm IV q8h (4hr extended infusions) Check vancomycin levels as needed Follow up renal function & cultures SCr variable - 1.36 this am, no change Vancomycin  12/26 rpt BCx: 12/28 BCID resulted 2 of 4 bottles Gram Positive Rods - no identification  Height: 5\' 7"  (170.2 cm) Weight: 71 kg (156 lb 8.4 oz) IBW/kg (Calculated) : 61.6  Temp (24hrs), Avg:100.1 F (37.8 C), Min:98.2 F (36.8 C), Max:101.12 F (38.4 C)  Recent Labs  Lab 12/04/20 0456 12/05/20 0813 12/06/20 0337 12/06/20 1323 12/06/20 1610 12/06/20 1756 12/07/20 0148 12/07/20 1858 12/07/20 2244 12/08/20 0215  WBC 18.7* 22.5* 21.9*  --   --   --  25.0*  --   --  26.7*  CREATININE 0.99 1.02* 1.24*  --   --  1.47* 1.34* 1.14*  --  1.36*  LATICACIDVEN  --   --   --  7.9* 5.1*  --   --  4.2* 3.3*  --     Estimated Creatinine Clearance: 38.5 mL/min (A) (by C-G formula based on SCr of 1.36 mg/dL (H)).    No Known Allergies  Antimicrobials this admission: Azithro 12/19 >> 12/23 Remdesivir 12/19 >> 12/23  Baricitinib 12/20 >> 12/26 Vanc 12/26 >> Zosyn 12/26 >>  Dose adjustments this admission:  Microbiology results: 12/19 BCx: 1/4 Staph hominis, likely contaminant  12/26 BCx: BCID 12/28 2/4 GPR 12/26 MRSA PCR:  Thank you for allowing pharmacy to be a part of this patient's care.  1/29 PharmD [facility] Rx [id number] 12/08/2020, 7:35 AM

## 2020-12-08 NOTE — Progress Notes (Signed)
Peripherally Inserted Central Catheter Placement  The IV Nurse has discussed with the patient and/or persons authorized to consent for the patient, the purpose of this procedure and the potential benefits and risks involved with this procedure.  The benefits include less needle sticks, lab draws from the catheter, and the patient may be discharged home with the catheter. Risks include, but not limited to, infection, bleeding, blood clot (thrombus formation), and puncture of an artery; nerve damage and irregular heartbeat and possibility to perform a PICC exchange if needed/ordered by physician.  Alternatives to this procedure were also discussed.  Bard Power PICC patient education guide, fact sheet on infection prevention and patient information card has been provided to patient /or left at bedside.    PICC Placement Documentation  PICC Triple Lumen 12/08/20 PICC Right Basilic 37 cm 0 cm (Active)  Indication for Insertion or Continuance of Line Chronic illness with exacerbations (CF, Sickle Cell, etc.) 12/08/20 1351  Exposed Catheter (cm) 0 cm 12/08/20 1351  Site Assessment Clean;Dry;Intact 12/08/20 1351  Lumen #1 Status Flushed;Blood return noted 12/08/20 1351  Lumen #2 Status Flushed;Blood return noted 12/08/20 1351  Lumen #3 Status Flushed;Blood return noted 12/08/20 1351  Dressing Type Transparent 12/08/20 1351  Dressing Status Clean;Dry;Intact 12/08/20 1351  Antimicrobial disc in place? Yes 12/08/20 1351  Safety Lock Not Applicable 12/08/20 1351  Line Adjustment (NICU/IV Team Only) No 12/08/20 1351  Dressing Intervention New dressing;Other (Comment) 12/08/20 1351  Dressing Change Due 12/15/20 12/08/20 1351       Reginia Forts Albarece 12/08/2020, 1:53 PM

## 2020-12-08 NOTE — Progress Notes (Signed)
Updated daughter. 

## 2020-12-08 NOTE — Progress Notes (Signed)
[facility] PLAN OF CARE NOTE Patient: [name] LNL:[id number]   PCP: Gwenyth Bender, MD DOB: 1951/03/11   DOA: 12-29-2020   DOS: 12/08/2020    Pt was very confused with increase work of breathing last night, had to be intubated. Currently on pressors as well. D/w PCCM, pt will be transferred to their service. [facility] will assume care when PCCM feels appropriate. Appreciate PCCM assistance.   Author: Lynden Oxford, MD [facility] 12/08/2020 8:32 AM   If 7PM-7AM, please contact night-coverage at [email or web address]

## 2020-12-08 NOTE — Progress Notes (Addendum)
PT Cancellation Note  Patient Details Name: [name] MRN: [id number] DOB: 1951-11-22   Cancelled Treatment:    Reason Eval/Treat Not Completed: Medical issues which prohibited therapy (pt placed on ventilator last night. Please re-order PT when pt is extubated and able to participate in PT.)  Tamala Ser PT 12/08/2020  Acute Rehabilitation Services Pager [phone] Office [phone]

## 2020-12-08 NOTE — Progress Notes (Signed)
Assisted tele visit to patient with daughter.  Terrisa Curfman Ann, RN  

## 2020-12-08 NOTE — Progress Notes (Signed)
eLink Physician-Brief Progress Note Patient Name: [name] DOB: [birth year] MRN: [id number]   Date of Service  12/08/2020  HPI/Events of Note  Agitation  Ventilator Asynchrony - BP   eICU Interventions  Plan: 1. Add Fentanyl IV infusion. Titrate to RASS = 0 to -1.     Intervention Category Major Interventions: Delirium, psychosis, severe agitation - evaluation and management  Domnick Chervenak Eugene 12/08/2020, 6:17 AM

## 2020-12-09 DIAGNOSIS — U071 COVID-19: Secondary | ICD-10-CM | POA: Diagnosis not present

## 2020-12-09 DIAGNOSIS — J9600 Acute respiratory failure, unspecified whether with hypoxia or hypercapnia: Secondary | ICD-10-CM | POA: Diagnosis not present

## 2020-12-09 LAB — BLOOD GAS, ARTERIAL
Acid-base deficit: 5.6 mmol/L — ABNORMAL HIGH (ref 0.0–2.0)
Bicarbonate: 20.6 mmol/L (ref 20.0–28.0)
FIO2: 70
O2 Saturation: 94 %
Patient temperature: 97.2
pCO2 arterial: 44.5 mmHg (ref 32.0–48.0)
pH, Arterial: 7.282 — ABNORMAL LOW (ref 7.350–7.450)
pO2, Arterial: 79.6 mmHg — ABNORMAL LOW (ref 83.0–108.0)

## 2020-12-09 LAB — C-REACTIVE PROTEIN: CRP: 27.9 mg/dL — ABNORMAL HIGH (ref <1.0)

## 2020-12-09 LAB — FERRITIN: Ferritin: 6740 ng/mL — ABNORMAL HIGH (ref 11–307)

## 2020-12-09 LAB — GLUCOSE, CAPILLARY
Glucose-Capillary: 355 mg/dL — ABNORMAL HIGH (ref 70–99)
Glucose-Capillary: 380 mg/dL — ABNORMAL HIGH (ref 70–99)
Glucose-Capillary: 289 mg/dL — ABNORMAL HIGH (ref 70–99)
Glucose-Capillary: 284 mg/dL — ABNORMAL HIGH (ref 70–99)
Glucose-Capillary: 305 mg/dL — ABNORMAL HIGH (ref 70–99)
Glucose-Capillary: 304 mg/dL — ABNORMAL HIGH (ref 70–99)

## 2020-12-09 LAB — MAGNESIUM
Magnesium: 2.4 mg/dL (ref 1.7–2.4)
Magnesium: 2.6 mg/dL — ABNORMAL HIGH (ref 1.7–2.4)

## 2020-12-09 LAB — PHOSPHORUS
Phosphorus: 3.9 mg/dL (ref 2.5–4.6)
Phosphorus: 3.8 mg/dL (ref 2.5–4.6)

## 2020-12-09 LAB — CBC WITH DIFFERENTIAL/PLATELET
Abs Immature Granulocytes: 1.09 10*3/uL — ABNORMAL HIGH (ref 0.00–0.07)
Basophils Absolute: 0.1 10*3/uL (ref 0.0–0.1)
Basophils Relative: 0 %
Eosinophils Absolute: 0 10*3/uL (ref 0.0–0.5)
Eosinophils Relative: 0 %
HCT: 30.8 % — ABNORMAL LOW (ref 36.0–46.0)
Hemoglobin: 9.7 g/dL — ABNORMAL LOW (ref 12.0–15.0)
Immature Granulocytes: 4 %
Lymphocytes Relative: 2 %
Lymphs Abs: 0.6 10*3/uL — ABNORMAL LOW (ref 0.7–4.0)
MCH: 35.4 pg — ABNORMAL HIGH (ref 26.0–34.0)
MCHC: 31.5 g/dL (ref 30.0–36.0)
MCV: 112.4 fL — ABNORMAL HIGH (ref 80.0–100.0)
Monocytes Absolute: 1.1 10*3/uL — ABNORMAL HIGH (ref 0.1–1.0)
Monocytes Relative: 4 %
Neutro Abs: 27.7 10*3/uL — ABNORMAL HIGH (ref 1.7–7.7)
Neutrophils Relative %: 90 %
Platelets: 220 10*3/uL (ref 150–400)
RBC: 2.74 MIL/uL — ABNORMAL LOW (ref 3.87–5.11)
RDW: 13.5 % (ref 11.5–15.5)
WBC: 30.6 10*3/uL — ABNORMAL HIGH (ref 4.0–10.5)
nRBC: 0.4 % — ABNORMAL HIGH (ref 0.0–0.2)

## 2020-12-09 LAB — COMPREHENSIVE METABOLIC PANEL
ALT: 57 U/L — ABNORMAL HIGH (ref 0–44)
AST: 25 U/L (ref 15–41)
Albumin: 2.7 g/dL — ABNORMAL LOW (ref 3.5–5.0)
Alkaline Phosphatase: 86 U/L (ref 38–126)
Anion gap: 11 (ref 5–15)
BUN: 36 mg/dL — ABNORMAL HIGH (ref 8–23)
CO2: 19 mmol/L — ABNORMAL LOW (ref 22–32)
Calcium: 7.9 mg/dL — ABNORMAL LOW (ref 8.9–10.3)
Chloride: 107 mmol/L (ref 98–111)
Creatinine, Ser: 1.52 mg/dL — ABNORMAL HIGH (ref 0.44–1.00)
GFR, Estimated: 37 mL/min — ABNORMAL LOW (ref >60)
Glucose, Bld: 377 mg/dL — ABNORMAL HIGH (ref 70–99)
Potassium: 4.2 mmol/L (ref 3.5–5.1)
Sodium: 137 mmol/L (ref 135–145)
Total Bilirubin: 1 mg/dL (ref 0.3–1.2)
Total Protein: 6.3 g/dL — ABNORMAL LOW (ref 6.5–8.1)

## 2020-12-09 LAB — HEPARIN LEVEL (UNFRACTIONATED): Heparin Unfractionated: 0.58 IU/mL (ref 0.30–0.70)

## 2020-12-09 LAB — D-DIMER, QUANTITATIVE: D-Dimer, Quant: 2.34 ug/mL-FEU — ABNORMAL HIGH (ref 0.00–0.50)

## 2020-12-09 MED ORDER — METHYLPREDNISOLONE SODIUM SUCC 40 MG IJ SOLR
40.0000 mg | INTRAMUSCULAR | Status: DC
  Administered 2020-12-10 – 2020-12-18 (×9): 40 mg via INTRAVENOUS
  Filled 2020-12-09 (×10): qty 1

## 2020-12-09 MED ORDER — INSULIN ASPART 100 UNIT/ML ~~LOC~~ SOLN
0.0000 [IU] | SUBCUTANEOUS | Status: DC
  Administered 2020-12-09: 20:00:00 15 [IU] via SUBCUTANEOUS
  Administered 2020-12-09: 13:00:00 11 [IU] via SUBCUTANEOUS
  Administered 2020-12-09: 17:00:00 15 [IU] via SUBCUTANEOUS
  Administered 2020-12-10: 21:00:00 11 [IU] via SUBCUTANEOUS
  Administered 2020-12-10: 05:00:00 7 [IU] via SUBCUTANEOUS
  Administered 2020-12-10: 16:00:00 11 [IU] via SUBCUTANEOUS
  Administered 2020-12-10: 12:00:00 4 [IU] via SUBCUTANEOUS
  Administered 2020-12-10: 01:00:00 7 [IU] via SUBCUTANEOUS
  Administered 2020-12-10 – 2020-12-11 (×3): 3 [IU] via SUBCUTANEOUS
  Administered 2020-12-11: 4 [IU] via SUBCUTANEOUS
  Administered 2020-12-11: 3 [IU] via SUBCUTANEOUS
  Administered 2020-12-11: 4 [IU] via SUBCUTANEOUS
  Administered 2020-12-11: 7 [IU] via SUBCUTANEOUS
  Administered 2020-12-12: 4 [IU] via SUBCUTANEOUS
  Administered 2020-12-12: 3 [IU] via SUBCUTANEOUS
  Administered 2020-12-12 (×2): 4 [IU] via SUBCUTANEOUS
  Administered 2020-12-12 – 2020-12-13 (×2): 3 [IU] via SUBCUTANEOUS
  Administered 2020-12-13 (×2): 4 [IU] via SUBCUTANEOUS
  Administered 2020-12-14: 7 [IU] via SUBCUTANEOUS
  Administered 2020-12-14: 4 [IU] via SUBCUTANEOUS
  Administered 2020-12-14: 3 [IU] via SUBCUTANEOUS
  Administered 2020-12-14: 4 [IU] via SUBCUTANEOUS
  Administered 2020-12-14: 3 [IU] via SUBCUTANEOUS
  Administered 2020-12-15 (×2): 4 [IU] via SUBCUTANEOUS
  Administered 2020-12-15: 3 [IU] via SUBCUTANEOUS
  Administered 2020-12-15 (×2): 4 [IU] via SUBCUTANEOUS
  Administered 2020-12-16: 7 [IU] via SUBCUTANEOUS
  Administered 2020-12-16 (×2): 11 [IU] via SUBCUTANEOUS
  Administered 2020-12-16 (×2): 4 [IU] via SUBCUTANEOUS
  Administered 2020-12-16: 11 [IU] via SUBCUTANEOUS
  Administered 2020-12-17: 3 [IU] via SUBCUTANEOUS
  Administered 2020-12-17 (×2): 11 [IU] via SUBCUTANEOUS
  Administered 2020-12-17 (×2): 7 [IU] via SUBCUTANEOUS
  Administered 2020-12-17: 11 [IU] via SUBCUTANEOUS
  Administered 2020-12-17 – 2020-12-18 (×5): 7 [IU] via SUBCUTANEOUS

## 2020-12-09 MED ORDER — PROSOURCE TF PO LIQD
90.0000 mL | Freq: Two times a day (BID) | ORAL | Status: DC
  Administered 2020-12-09 – 2020-12-18 (×19): 90 mL
  Filled 2020-12-09 (×18): qty 90

## 2020-12-09 MED ORDER — PANTOPRAZOLE SODIUM 40 MG PO PACK
40.0000 mg | PACK | Freq: Every day | ORAL | Status: DC
  Administered 2020-12-09 – 2020-12-18 (×10): 40 mg
  Filled 2020-12-09 (×9): qty 20

## 2020-12-09 MED ORDER — PENICILLIN G POTASSIUM 20000000 UNITS IJ SOLR
9.0000 10*6.[IU] | Freq: Two times a day (BID) | INTRAVENOUS | Status: DC
  Administered 2020-12-09 – 2020-12-14 (×11): 9 10*6.[IU] via INTRAVENOUS
  Filled 2020-12-09 (×11): qty 9

## 2020-12-09 MED ORDER — VITAL HIGH PROTEIN PO LIQD
1000.0000 mL | ORAL | Status: AC
  Administered 2020-12-09 – 2020-12-15 (×7): 1000 mL

## 2020-12-09 MED ORDER — PROSOURCE TF PO LIQD
45.0000 mL | Freq: Every day | ORAL | Status: DC
  Administered 2020-12-09 – 2020-12-14 (×5): 45 mL
  Filled 2020-12-09 (×5): qty 45

## 2020-12-09 MED ORDER — INSULIN ASPART 100 UNIT/ML ~~LOC~~ SOLN
5.0000 [IU] | SUBCUTANEOUS | Status: DC
  Administered 2020-12-09 – 2020-12-10 (×6): 5 [IU] via SUBCUTANEOUS

## 2020-12-09 MED ORDER — PROSOURCE TF PO LIQD
45.0000 mL | Freq: Two times a day (BID) | ORAL | Status: DC
  Administered 2020-12-09: 10:00:00 45 mL
  Filled 2020-12-09: qty 45

## 2020-12-09 NOTE — Progress Notes (Signed)
Assisted tele visit to patient with daughter.  [name] R Taryn Nave, RN   

## 2020-12-09 NOTE — Progress Notes (Signed)
eLink Physician-Brief Progress Note Patient Name: [name] DOB: November 25, 1951 MRN: [id number]   Date of Service  12/09/2020  HPI/Events of Note  Hyperglycemia - Blood glucose = 380.   eICU Interventions  Plan: 1. D/C D5W IV infusion running at 50 mL/hour.      Intervention Category Major Interventions: Hyperglycemia - active titration of insulin therapy  Lenell Antu 12/09/2020, 5:19 AM
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
## 2020-12-09 NOTE — TOC Progression Note (Signed)
Transition of Care [facility]) - Progression Note    Patient Details  Name: [name] MRN: [id number] Date of Birth: [birth year]  [facility]) CM/SW Contact  Golda Acre, RN Phone Number: 12/09/2020, 7:21 AM  Clinical Narrative:    Assessment & Plan:  Acute hypoxic respiratory failure due to Covid pneumonia -On ventilator -Continue mechanical ventilation per ARDS protocol Target TVol 6-8cc/kgIBW Target Plateau Pressure < 30cm H20 Target driving pressure less than 15 cm of water Target PaO2 55-65: titrate PEEP/FiO2 per protocol As long as PaO2 to FiO2 ratio is less than 1:150 position in prone position for 16 hours a day Ventilator associated pneumonia prevention protocol  Pulmonary embolism -On full dose anticoagulation  Encephalopathy -EEG negative for seizures -Empiric Keppra  Acute COVID-19 viral pneumonia -On baricitinib on steroids  Chronic CVA -Family stated no prior history of CVA -CT showing areas of encephalomalacia consistent with chronic CVA in the left parieto-occipital region   Transaminitis -Continue to monitor closely  Hypothyroidism -Continue Synthroid  Acute kidney injury -Trend electrolytes -Avoid nephrotoxic's  Risk of decompensation remains very high  Requiring intermittent paralytics for hypoxemia  Prognosis is guarded Following for progression   Expected Discharge Plan: Home/Self Care Barriers to Discharge: No Barriers Identified  Expected Discharge Plan and Services Expected Discharge Plan: Home/Self Care       Living arrangements for the past 2 months: Single Family Home                                       Social Determinants of Health (SDOH) Interventions    Readmission Risk Interventions No flowsheet data found.

## 2020-12-09 NOTE — Progress Notes (Signed)
Inpatient Diabetes Program Recommendations  AACE/ADA: New Consensus Statement on Inpatient Glycemic Control (2015)  Target Ranges:  Prepandial:   less than 140 mg/dL      Peak postprandial:   less than 180 mg/dL (1-2 hours)      Critically ill patients:  140 - 180 mg/dL   Lab Results  Component Value Date   GLUCAP 289 (H) 12/09/2020   HGBA1C 4.9 11/30/2020    Review of Glycemic Control  Diabetes history: None Outpatient Diabetes medications: None Current orders for Inpatient glycemic control: Novolog 0-20 units Q4H + 5 units Q4H for TF coverage.  May benefit from small amount of basal insulin while on steroids.  Inpatient Diabetes Program Recommendations:    Add Levemir 4 units bid. Continue with Novolog 0-20 + 5 units Q4H.  Follow.  Thank you. Ailene Ards, RD, LDN, CDE Inpatient Diabetes Coordinator [phone]
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
## 2020-12-09 NOTE — Progress Notes (Signed)
ANTICOAGULATION CONSULT NOTE - Follow Up Consult  Pharmacy Consult for IV heparin Indication: pulmonary embolus  No Known Allergies  Patient Measurements: Height: 5\' 7"  (170.2 cm) Weight: 71 kg (156 lb 8.4 oz) IBW/kg (Calculated) : 61.6 Heparin Dosing Weight: actual body weight   Vital Signs: Temp: 98.42 F (36.9 C) (12/29 0630) Temp Source: Bladder (12/29 0400) BP: 134/80 (12/29 0630) Pulse Rate: 59 (12/29 0630)  Labs: Recent Labs    12/06/20 1756 12/06/20 1756 12/07/20 0148 12/07/20 1410 12/07/20 1858 12/08/20 0215 12/08/20 1017 12/09/20 0451  HGB  --    < > 13.4  --   --  11.5*  --  9.7*  HCT  --   --  40.8  --   --  36.6  --  30.8*  PLT  --   --  134*  --   --  164  --  220  APTT 27  --   --   --   --   --   --   --   LABPROT 15.6*  --   --   --   --   --   --   --   INR 1.3*  --   --   --   --   --   --   --   HEPARINUNFRC  --   --  1.66*   < >  --  0.68 0.53 0.58  CREATININE 1.47*  --  1.34*  --  1.14* 1.36*  --  1.52*   < > = values in this interval not displayed.   Estimated Creatinine Clearance: 34.4 mL/min (A) (by C-G formula based on SCr of 1.52 mg/dL (H)).  Medical History: Past Medical History:  Diagnosis Date  . Arthritis   . Depression   . GERD (gastroesophageal reflux disease)   . Hypertension   . Thyroid disease   . Tobacco abuse    Assessment: [name] is a 69 y.o. female admitted on 12-19-20 with COVID pneumonia. D-dimer increased to > 20 on 12/21. CTa chest on 12/21 negative for PE. Lower extremity venous duplex on 12/22 negative for DVT. Developed acute encephalopathy 12/26 and lactic acidosis requiring transfer to ICU. CTa chest today + for acute PE within posterior segmental branches of the left lower lobe, no evidence of RV strain. Pharmacy consulted for IV heparin dosing. Patient has been on Enoxaparin 40mg  SQ q24h for VTE prophylaxis  12/09/20  Heparin level = 0.58 on 850 units/hr, remains in therapeutic range  Hgb down  to 11.5, plts WNL  Ddimer decreasing from > 20  No bleeding or line issues noted per RN  Goal of Therapy:  Heparin level 0.3-0.7 units/ml Monitor platelets by anticoagulation protocol: Yes   Plan:  Continue heparin drip at 850 units/hr Daily CBC, heparin level Monitor closely for s/sx of bleeding  PharmD 12/09/2020, 7:45 AM

## 2020-12-10 ENCOUNTER — Inpatient Hospital Stay (HOSPITAL_COMMUNITY): Payer: Medicare Other

## 2020-12-10 DIAGNOSIS — U071 COVID-19: Secondary | ICD-10-CM | POA: Diagnosis not present

## 2020-12-10 DIAGNOSIS — J80 Acute respiratory distress syndrome: Secondary | ICD-10-CM

## 2020-12-10 LAB — COMPREHENSIVE METABOLIC PANEL
ALT: 39 U/L (ref 0–44)
AST: 22 U/L (ref 15–41)
Albumin: 2.1 g/dL — ABNORMAL LOW (ref 3.5–5.0)
Alkaline Phosphatase: 69 U/L (ref 38–126)
Anion gap: 8 (ref 5–15)
BUN: 50 mg/dL — ABNORMAL HIGH (ref 8–23)
CO2: 22 mmol/L (ref 22–32)
Calcium: 8.5 mg/dL — ABNORMAL LOW (ref 8.9–10.3)
Chloride: 111 mmol/L (ref 98–111)
Creatinine, Ser: 1.65 mg/dL — ABNORMAL HIGH (ref 0.44–1.00)
GFR, Estimated: 34 mL/min — ABNORMAL LOW (ref >60)
Glucose, Bld: 209 mg/dL — ABNORMAL HIGH (ref 70–99)
Potassium: 3.8 mmol/L (ref 3.5–5.1)
Sodium: 141 mmol/L (ref 135–145)
Total Bilirubin: 0.6 mg/dL (ref 0.3–1.2)
Total Protein: 5.6 g/dL — ABNORMAL LOW (ref 6.5–8.1)

## 2020-12-10 LAB — CULTURE, BLOOD (ROUTINE X 2)
Special Requests: ADEQUATE
Special Requests: ADEQUATE

## 2020-12-10 LAB — CBC WITH DIFFERENTIAL/PLATELET
Abs Immature Granulocytes: 1.58 10*3/uL — ABNORMAL HIGH (ref 0.00–0.07)
Basophils Absolute: 0.1 10*3/uL (ref 0.0–0.1)
Basophils Relative: 0 %
Eosinophils Absolute: 0.1 10*3/uL (ref 0.0–0.5)
Eosinophils Relative: 0 %
HCT: 26.8 % — ABNORMAL LOW (ref 36.0–46.0)
Hemoglobin: 8.4 g/dL — ABNORMAL LOW (ref 12.0–15.0)
Immature Granulocytes: 6 %
Lymphocytes Relative: 4 %
Lymphs Abs: 1.2 10*3/uL (ref 0.7–4.0)
MCH: 35.1 pg — ABNORMAL HIGH (ref 26.0–34.0)
MCHC: 31.3 g/dL (ref 30.0–36.0)
MCV: 112.1 fL — ABNORMAL HIGH (ref 80.0–100.0)
Monocytes Absolute: 1.4 10*3/uL — ABNORMAL HIGH (ref 0.1–1.0)
Monocytes Relative: 5 %
Neutro Abs: 23.6 10*3/uL — ABNORMAL HIGH (ref 1.7–7.7)
Neutrophils Relative %: 85 %
Platelets: 246 10*3/uL (ref 150–400)
RBC: 2.39 MIL/uL — ABNORMAL LOW (ref 3.87–5.11)
RDW: 13.5 % (ref 11.5–15.5)
WBC: 27.9 10*3/uL — ABNORMAL HIGH (ref 4.0–10.5)
nRBC: 1.3 % — ABNORMAL HIGH (ref 0.0–0.2)

## 2020-12-10 LAB — MAGNESIUM
Magnesium: 2.5 mg/dL — ABNORMAL HIGH (ref 1.7–2.4)
Magnesium: 2.5 mg/dL — ABNORMAL HIGH (ref 1.7–2.4)

## 2020-12-10 LAB — CREATININE, SERUM
Creatinine, Ser: 1.76 mg/dL — ABNORMAL HIGH (ref 0.44–1.00)
GFR, Estimated: 31 mL/min — ABNORMAL LOW (ref >60)

## 2020-12-10 LAB — GLUCOSE, CAPILLARY
Glucose-Capillary: 133 mg/dL — ABNORMAL HIGH (ref 70–99)
Glucose-Capillary: 191 mg/dL — ABNORMAL HIGH (ref 70–99)
Glucose-Capillary: 204 mg/dL — ABNORMAL HIGH (ref 70–99)
Glucose-Capillary: 236 mg/dL — ABNORMAL HIGH (ref 70–99)
Glucose-Capillary: 259 mg/dL — ABNORMAL HIGH (ref 70–99)
Glucose-Capillary: 262 mg/dL — ABNORMAL HIGH (ref 70–99)

## 2020-12-10 LAB — TRIGLYCERIDES: Triglycerides: 381 mg/dL — ABNORMAL HIGH (ref <150)

## 2020-12-10 LAB — PHOSPHORUS
Phosphorus: 3.4 mg/dL (ref 2.5–4.6)
Phosphorus: 3.6 mg/dL (ref 2.5–4.6)

## 2020-12-10 LAB — D-DIMER, QUANTITATIVE: D-Dimer, Quant: 1.85 ug/mL-FEU — ABNORMAL HIGH (ref 0.00–0.50)

## 2020-12-10 LAB — FERRITIN: Ferritin: 5237 ng/mL — ABNORMAL HIGH (ref 11–307)

## 2020-12-10 LAB — C-REACTIVE PROTEIN: CRP: 18.1 mg/dL — ABNORMAL HIGH (ref <1.0)

## 2020-12-10 LAB — HEPARIN LEVEL (UNFRACTIONATED): Heparin Unfractionated: 0.42 IU/mL (ref 0.30–0.70)

## 2020-12-10 MED ORDER — INSULIN ASPART 100 UNIT/ML ~~LOC~~ SOLN
8.0000 [IU] | SUBCUTANEOUS | Status: DC
  Administered 2020-12-10 – 2020-12-14 (×23): 8 [IU] via SUBCUTANEOUS

## 2020-12-10 NOTE — Consult Note (Signed)
[facility]                                                                                        Patient Identification: Patient Name: [name] MRN: [id number] [place] Date: 12/03/2020  7:14 PM Today's Date: 12/10/2020 Reason for consult: Corynebacterium pseudodiptheria bacteremia  Principal Problem:   Acute respiratory failure due to COVID-19 [facility]) Active Problems:   Hypothyroidism   HYPERTENSION, BENIGN ESSENTIAL   Cigarette smoker   Acute hypoxemic respiratory failure due to COVID-19 [facility])   Antibiotics: Penicillin Day 2, Zosyn Day 5 and vancomycin day 5   Assessment Acute Respiratory Failure in the setting of COVID PNA and Presumed VAP  ? Neurosyphilis: Positive T pallidum abx and RPR, RPR with titre 1:1, No definite h/o treatment for syphilis and on empiric treatment for neurosyphilis with no LP to r/o Neurosyphilis in the setting of COVID PNA  Corynebacterium pseudodiptheriae in blood cultures   Recommendations  Given negative MRSA PCR, improving chest xray findings, would be reasonable to DC Vancomycin in the setting of AKI. 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 This should also cover the Corynebacterium pseudodiptheriae which was isolated in blood cx. I suspect this is a contaminant and not a true bacteremia  Repeat blood cultures  Dr Johnnye Sima will follow from Friday.  Rest of the management as per the primary team. Please call with questions or concerns.  Thank you for the consult _________________________________________________________________________________________________________ HPI and Hospital Course: [name] is a 69 y.o. female with PMH of hypothyroidism, hypertension, GERD, depression, tobacco abuse, osteoarthritis with recent  COVID diagnosis who presented to the ER due to worsening symptoms of vomiting fever and hypoxia. She was treated with oxygen, remdesivir, steroids, baricitinib and Actemra.Mental status change was noted on 12/26 where she was only oriented to self and repeatingherself. She had an episode of desaturation when she took her oxygen mask off and fecal incontinence. Oxygen saturations recovered. She was transferred to ICU .No seizure activity was noted. Lactate was found to be 7.9. she was transferred to the ICU 12/27 intubated for increased work of breathing and hypoxemia CT chest with pulmonary embolism   ROS: unavailable as patient is intubated   Past Medical History:  Diagnosis Date  . Arthritis   . Depression   . GERD (gastroesophageal reflux disease)   . Hypertension   . Thyroid disease   . Tobacco abuse    Past Surgical History:  Procedure Laterality Date  . CESAREAN SECTION    . EYE SURGERY       Scheduled Meds: . chlorhexidine gluconate (MEDLINE KIT)  15 mL Mouth Rinse BID  . Chlorhexidine Gluconate Cloth  6 each Topical Q0600  . feeding supplement (PROSource TF)  45 mL Per Tube Daily  . feeding supplement (PROSource TF)  90 mL Per Tube BID  . feeding supplement (VITAL HIGH PROTEIN)  1,000 mL Per Tube Q24H  . folic acid  1 mg Per Tube Daily  . insulin aspart  0-20 Units Subcutaneous Q4H  .  insulin aspart  8 Units Subcutaneous Q4H  . levothyroxine  62.5 mcg Intravenous Daily  . mouth rinse  15 mL Mouth Rinse 10 times per day  . methylPREDNISolone (SOLU-MEDROL) injection  40 mg Intravenous Q24H  . multivitamin  15 mL Per Tube Daily  . pantoprazole sodium  40 mg Per Tube Q1200  . sodium chloride flush  10-40 mL Intracatheter Q12H  . thiamine  100 mg Per Tube Daily   Or  . thiamine  100 mg Intravenous Daily   Continuous Infusions: . sodium chloride Stopped (12/08/20 0436)  . fentaNYL infusion INTRAVENOUS 225 mcg/hr (12/10/20 0912)  . heparin 850 Units/hr (12/10/20  0653)  . levETIRAcetam Stopped (12/10/20 0522)  . norepinephrine (LEVOPHED) Adult infusion 4 mcg/min (12/10/20 0913)  . penicillin g continuous IV infusion 9 Million Units (12/10/20 0050)  . piperacillin-tazobactam (ZOSYN)  IV Stopped (12/10/20 0438)  . propofol (DIPRIVAN) infusion 35 mcg/kg/min (12/10/20 0914)  . vancomycin Stopped (12/09/20 1948)   PRN Meds:.acetaminophen (TYLENOL) oral liquid 160 mg/5 mL, artificial tears, atropine, fentaNYL (SUBLIMAZE) injection, lip balm, midazolam, [DISCONTINUED] ondansetron **OR** ondansetron (ZOFRAN) IV, sodium chloride flush, vecuronium  No Known Allergies  Social History   Socioeconomic History  . Marital status: Widowed    Spouse name: Not on file  . Number of children: Not on file  . Years of education: Not on file  . Highest education level: Not on file  Occupational History  . Occupation: retired   Tobacco Use  . Smoking status: Current Every Day Smoker    Packs/day: 0.50    Years: 40.00    Pack years: 20.00    Types: Cigarettes  . Smokeless tobacco: Never Used  Substance and Sexual Activity  . Alcohol use: Yes    Alcohol/week: 0.0 standard drinks    Comment: occational  . Drug use: Yes    Types: Marijuana    Comment: occational  . Sexual activity: Yes    Birth control/protection: Post-menopausal  Other Topics Concern  . Not on file  Social History Narrative   Lives alone in a one story home.  Has one daughter.     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   Social Determinants of Health   Financial Resource Strain: Not on file  Food Insecurity: Not on file  Transportation Needs: Not on file  Physical Activity: Not on file  Stress: Not on file  Social Connections: Not on file  Intimate Partner Violence: Not on file    Vitals  BP 112/65   Pulse (!) 59   Temp 98.6 F (37 C)   Resp (!) 24   Ht _0  (1.702 m)   Wt 75.5 kg   SpO2 93%   BMI 26.07 kg/m     Physical Exam Constitutional:  Intubated and sedated, on Fentanyl, 6 of levophed    Comments:   Cardiovascular:     Rate and Rhythm: Normal rate and regular rhythm.     Heart sounds:   Pulmonary:     Effort: mechanical breath sounds, coarse    Comments:   Abdominal:     Palpations: Abdomen is soft.     Tenderness:   Musculoskeletal:        General: No swelling   Skin:    Comments: no obvious rashes   Neurological:     General:   Psychiatric:        Mood and Affect:   PICC line+  Pertinent Microbiology - Results for orders placed or performed during the hospital encounter of 11/25/2020  Blood Culture (routine x 2)     Status: None   Collection Time: 11/19/2020  7:44 PM   Specimen: BLOOD  Result Value Ref Range Status   Specimen Description   Final    BLOOD BLOOD RIGHT FOREARM Performed at [facility] [address], [place], [place] [address]    Special Requests   Final    BOTTLES DRAWN AEROBIC AND ANAEROBIC Blood Culture results may not be optimal due to an excessive volume of blood received in culture bottles Performed at [facility] [address], [place], [place] [address]    Culture   Final    NO GROWTH 5 DAYS Performed at [facility], [place] [address], [place], [place] [address]    Report Status 12/05/2020 FINAL  Final  Blood Culture (routine x 2)     Status: Abnormal   Collection Time: 11/24/2020 10:11 PM   Specimen: BLOOD  Result Value Ref Range Status   Specimen Description   Final    BLOOD RIGHT ANTECUBITAL Performed at [facility] [address], [place], [place] [address]    Special Requests   Final    BOTTLES DRAWN AEROBIC AND ANAEROBIC Blood Culture adequate volume Performed at [facility] [address], [place], [place] [address]    Culture  Setup Time   Final    GRAM POSITIVE COCCI IN CLUSTERS AEROBIC BOTTLE ONLY Organism ID to follow CRITICAL  RESULT CALLED TO, READ BACK BY AND VERIFIED WITH: Sheffield Slider [facility] 11/30/20 2329 JDW    Culture (A)  Final    STAPHYLOCOCCUS HOMINIS THE SIGNIFICANCE OF ISOLATING THIS ORGANISM FROM A SINGLE SET OF BLOOD CULTURES WHEN MULTIPLE SETS ARE DRAWN IS UNCERTAIN. PLEASE NOTIFY THE MICROBIOLOGY DEPARTMENT WITHIN ONE WEEK IF SPECIATION AND SENSITIVITIES ARE REQUIRED. Performed at [facility], [place] [address] [place], [place] [address]    Report Status 12/01/2020 FINAL  Final  Blood Culture ID Panel (Reflexed)     Status: Abnormal   Collection Time: 11/13/2020 10:11 PM  Result Value Ref Range Status   Enterococcus faecalis NOT DETECTED NOT DETECTED Final   Enterococcus Faecium NOT DETECTED NOT DETECTED Final   Listeria monocytogenes NOT DETECTED NOT DETECTED Final   Staphylococcus species DETECTED (A) NOT DETECTED Final    Comment: CRITICAL RESULT CALLED TO, READ BACK BY AND VERIFIED WITH: Sheffield Slider [facility] 11/30/20 2329 JDW    Staphylococcus aureus (BCID) NOT DETECTED NOT DETECTED Final   Staphylococcus epidermidis NOT DETECTED NOT DETECTED Final   Staphylococcus lugdunensis NOT DETECTED NOT DETECTED Final   Streptococcus species NOT DETECTED NOT DETECTED Final   Streptococcus agalactiae NOT DETECTED NOT DETECTED Final   Streptococcus pneumoniae NOT DETECTED NOT DETECTED Final   Streptococcus pyogenes NOT DETECTED NOT DETECTED Final   A.calcoaceticus-baumannii NOT DETECTED NOT DETECTED Final   Bacteroides fragilis NOT DETECTED NOT DETECTED Final   Enterobacterales NOT DETECTED NOT DETECTED Final   Enterobacter cloacae complex NOT DETECTED NOT DETECTED Final   Escherichia coli NOT DETECTED NOT DETECTED Final   Klebsiella aerogenes NOT DETECTED NOT DETECTED Final   Klebsiella oxytoca NOT DETECTED NOT DETECTED Final   Klebsiella pneumoniae NOT DETECTED NOT DETECTED Final   Proteus species NOT DETECTED NOT DETECTED Final   Salmonella species NOT DETECTED NOT DETECTED Final   Serratia  marcescens NOT DETECTED NOT DETECTED Final  Haemophilus influenzae NOT DETECTED NOT DETECTED Final   Neisseria meningitidis NOT DETECTED NOT DETECTED Final   Pseudomonas aeruginosa NOT DETECTED NOT DETECTED Final   Stenotrophomonas maltophilia NOT DETECTED NOT DETECTED Final   Candida albicans NOT DETECTED NOT DETECTED Final   Candida auris NOT DETECTED NOT DETECTED Final   Candida glabrata NOT DETECTED NOT DETECTED Final   Candida krusei NOT DETECTED NOT DETECTED Final   Candida parapsilosis NOT DETECTED NOT DETECTED Final   Candida tropicalis NOT DETECTED NOT DETECTED Final   Cryptococcus neoformans/gattii NOT DETECTED NOT DETECTED Final    Comment: Performed at [facility], [place] [address], [place], [place] [address]  Culture, blood (routine x 2)     Status: Abnormal   Collection Time: 12/06/20  1:23 PM   Specimen: BLOOD  Result Value Ref Range Status   Specimen Description   Final    BLOOD BLOOD Performed at [facility] [address] [place], [place] [address]    Special Requests   Final    Blood Culture adequate volume Performed at [facility] [address] [place], [place] [address]    Culture  Setup Time   Final    GRAM POSITIVE RODS AEROBIC BOTTLE ONLY CRITICAL RESULT CALLED TO, READ BACK BY AND VERIFIED WITH: H. JACKSON PHARMD, AT 0700 12/08/20 BY D. VANHOOK    Culture (A)  Final    CORYNEBACTERIUM PSEUDODIPHTHERIAE Standardized susceptibility testing for this organism is not available. Performed at [facility], [place] [address], [place], [place] [address]    Report Status 12/10/2020 FINAL  Final  Culture, blood (routine x 2)     Status: Abnormal   Collection Time: 12/06/20  1:23 PM   Specimen: BLOOD  Result Value Ref Range Status   Specimen Description   Final    BLOOD BLOOD RIGHT HAND BLOOD LEFT HAND Performed at [facility] [address], [place], [place] [address]    Special  Requests   Final    Blood Culture adequate volume Performed at [facility] [address] [place], [place] [address]    Culture  Setup Time   Final    GRAM POSITIVE RODS AEROBIC BOTTLE ONLY CRITICAL RESULT CALLED TO, READ BACK BY AND VERIFIED WITH: H. JACKSON PHARMD, AT 0700 12/08/20 BY D. VANHOOK    Culture (A)  Final    CORYNEBACTERIUM PSEUDODIPHTHERIAE Standardized susceptibility testing for this organism is not available. Performed at [facility], [place] [address], [place], [place] [address]    Report Status 12/10/2020 FINAL  Final  MRSA PCR Screening     Status: None   Collection Time: 12/06/20  2:25 PM   Specimen: Nasal Mucosa; Nasopharyngeal  Result Value Ref Range Status   MRSA by PCR NEGATIVE NEGATIVE Final    Comment:        The GeneXpert MRSA Assay (FDA approved for NASAL specimens only), is one component of a comprehensive MRSA colonization surveillance program. It is not intended to diagnose MRSA infection nor to guide or monitor treatment for MRSA infections. Performed at [facility], [place] [address] [place], [place] [address]    Pertinent Lab seen by me: CBC Latest Ref Rng & Units 12/10/2020 12/09/2020 12/08/2020  WBC 4.0 - 10.5 K/uL 27.9(H) 30.6(H) 26.7(H)  Hemoglobin 12.0 - 15.0 g/dL 8.4(L) 9.7(L) 11.5(L)  Hematocrit 36.0 - 46.0 % 26.8(L) 30.8(L) 36.6  Platelets 150 - 400 K/uL 246 220 164  CMP Latest Ref Rng & Units 12/10/2020 12/09/2020 12/08/2020  Glucose 70 - 99 mg/dL - 377(H) 215(H)  BUN 8 - 23 mg/dL - 36(H) 39(H)  Creatinine 0.44 - 1.00 mg/dL 1.76(H) 1.52(H) 1.36(H)  Sodium 135 - 145 mmol/L - 137 148(H)  Potassium 3.5 - 5.1 mmol/L - 4.2 3.6  Chloride 98 - 111 mmol/L - 107 114(H)  CO2 22 - 32 mmol/L - 19(L) 22  Calcium 8.9 - 10.3 mg/dL - 7.9(L) 9.1  Total Protein 6.5 - 8.1 g/dL - 6.3(L) 6.9  Total Bilirubin 0.3 - 1.2 mg/dL - 1.0 1.5(H)  Alkaline Phos 38 - 126 U/L - 86 108  AST 15 - 41 U/L - 25  66(H)  ALT 0 - 44 U/L - 57(H) 91(H)    Pertinent Imagings/Other Imagings Plain films and CT images have been personally visualized and interpreted; radiology reports have been reviewed. Decision making incorporated into the Impression / Recommendations.   I have spent greater than 60 minutes for this patient encounter including review of prior medical records with greater than 50% of time being face to face and coordination of their care.  Electronically signed by:   Rosiland Oz, MD Infectious Disease Physician [facility] for Infectious Disease Pager: [phone]

## 2020-12-10 NOTE — Progress Notes (Signed)
ANTICOAGULATION CONSULT NOTE - Follow Up Consult  Pharmacy Consult for IV heparin Indication: pulmonary embolus  No Known Allergies  Patient Measurements: Height: 5\' 7"  (170.2 cm) Weight: 75.5 kg (166 lb 7.2 oz) IBW/kg (Calculated) : 61.6 Heparin Dosing Weight: actual body weight   Vital Signs: Temp: 98.78 F (37.1 C) (12/30 0400) Temp Source: Bladder (12/30 0400) BP: 98/52 (12/30 0400) Pulse Rate: 77 (12/30 0400)  Labs: Recent Labs    12/08/20 0215 12/08/20 1017 12/09/20 0451 12/10/20 0424  HGB 11.5*  --  9.7* 8.4*  HCT 36.6  --  30.8* 26.8*  PLT 164  --  220 246  HEPARINUNFRC 0.68 0.53 0.58 0.42  CREATININE 1.36*  --  1.52* 1.76*   Estimated Creatinine Clearance: 32.5 mL/min (A) (by C-G formula based on SCr of 1.76 mg/dL (H)).  Medical History: Past Medical History:  Diagnosis Date  . Arthritis   . Depression   . GERD (gastroesophageal reflux disease)   . Hypertension   . Thyroid disease   . Tobacco abuse    Assessment: [name] is a 70 y.o. female admitted on 12-07-20 with COVID pneumonia. D-dimer increased to > 20 on 12/21. CTa chest on 12/21 negative for PE. Lower extremity venous duplex on 12/22 negative for DVT. Developed acute encephalopathy 12/26 and lactic acidosis requiring transfer to ICU. CTa chest today + for acute PE within posterior segmental branches of the left lower lobe, no evidence of RV strain. Pharmacy consulted for IV heparin dosing. Patient has been on Enoxaparin 40mg  SQ q24h for VTE prophylaxis  12/10/20  Heparin level = 0.42 on 850 units/hr, remains in therapeutic range  Hgb down to 8.4, plts WNL  Ddimer decreasing from > 20  No bleeding or line issues noted   Goal of Therapy:  Heparin level 0.3-0.7 units/ml Monitor platelets by anticoagulation protocol: Yes   Plan:  Continue heparin drip at 850 units/hr Daily CBC, heparin level Monitor closely for s/sx of bleeding  RPh 12/10/2020, 5:15 AM

## 2020-12-10 NOTE — Progress Notes (Signed)
Assisted tele visit to patient with daughter.  Margaret Pyle, RN

## 2020-12-10 NOTE — Progress Notes (Signed)
Spoke w/ ID. Will dc vanc/zosyn, re-culture blood and cont monocoverage w/ PCN G Simonne Martinet ACNP-BC [facility] Pulmonary/Critical Care Pager # [phone] OR # [phone] if no answer

## 2020-12-10 NOTE — Progress Notes (Signed)
NAME:  [name], MRN:  [id number], DOB:  11/07/1951, LOS: 40 ADMISSION DATE:  12/08/2020, CONSULTATION DATE: 12/06/2020 REFERRING MD: Dr. Posey Pronto, [facility] hospitalist, CHIEF COMPLAINT: Encephalopathy, hypoxia   Brief History:  69 year old smoker admitted 12/19 with acute hypoxic respiratory failure due to COVID 19 viral pneumonia.  Has been on nonrebreather and high flow nasal cannula. Developed acute encephalopathy 12/26 and lactic acidosis requiring transfer to ICU    Past Medical History:   Past Medical History:  Diagnosis Date  . Arthritis   . Depression   . GERD (gastroesophageal reflux disease)   . Hypertension   . Thyroid disease   . Tobacco abuse     Significant Hospital Events:   12/19 hospital admission 12/23 completed remdesivir 12/26 transferred to ICU, broad-spectrum antibiotics initiated.  Baricitinib stopped, cultures sent, eventually came back as gram-positive rods in 2 samples, CT of chest positive for pulmonary emboli systemic) started 12/27-intubated, required prone positioning 12/29: FiO2 and PEEP requirements improving, tapering steroids.  Still on pressors.  Escalating sliding scale for significant hyperglycemia. Started cont PCN G infusion as RPR was reactive and titer 1:1 w/ T Pallidum Abs Positive w/ delirium decided to treat as neurosyphilis (per ID suggestion).  12/30 BC finalized w/  CORYNEBACTERIUM PSEUDODIPHTHERIAE. Asked ID to formally see for ABX suggestions. Peep?FIO2 needs as well as CXR better. Renal fxn little worse   Consults:  PCCM-12/26  Procedures:  Endotracheal intubation 12/27  Significant Diagnostic Tests:  Head CT 12/26 encephalomalacia related to remote infarcts in left parietal occipital, right occipital and left temporal lobe -new compared to 2014.  CT angiogram chest 12/26 acute pulmonary emboli within segmental branches of left lower lobe , small clot burden, no evidence of RV strain  CT angiogram chest 12/21 no  evidence of pulmonary embolus, diffuse interlobular septal thickening with GGO especially basis  Micro Data:  Blood culture 12/19 1/4 staff hominis Blood cultures x2 on 12/26: CORYNEBACTERIUM PSEUDODIPHTHERIAE  Antimicrobials:  Zosyn 12/26>> Vancomycin 12/26>> Cont PCN G infusion 12/29>>>  Interim History / Subjective:   Oxygen requirements better. Objective   Blood pressure (Abnormal) 112/54, pulse 66, temperature 98.42 F (36.9 C), resp. rate (Abnormal) 24, height 5\' 7"  (1.702 m), weight 75.5 kg, SpO2 (Abnormal) 89 %.    Vent Mode: PRVC FiO2 (%):  [50 %-60 %] 60 % Set Rate:  [24 bmp] 24 bmp Vt Set:  [490 mL] 490 mL PEEP:  [10 cmH20] 10 cmH20 Plateau Pressure:  [18 cmH20-27 cmH20] 18 cmH20   Intake/Output Summary (Last 24 hours) at 12/10/2020 0842 Last data filed at 12/10/2020 [id number] Gross per 24 hour  Intake 3368.74 ml  Output 950 ml  Net 2418.74 ml   Filed Weights   12/01/20 1426 12/08/20 0400 12/10/20 0500  Weight: 75.9 kg 71 kg 75.5 kg    Examination: General 69 year old female currently on full ventilator support HEENT normocephalic atraumatic no jugular venous distention orally intubated Pulmonary scattered rhonchi, peak inspiratory pressures in the mid 20s, not able to calculate plateau pressure currently due to vent synchrony does still desaturate with coughing but PEEP down to 10/FiO2 down to 50% Cardiac regular rate and rhythm Abdomen soft not tender no organomegaly Extremities are warm and dry with strong pulses Neuro still heavily sedated  Resolved Hospital Problem list   Thrombocytopenia   Assessment & Plan:   Acute hypoxic respiratory failure due to Covid pneumonia, with ARDS further complicated by acute pulmonary embolism PCXR personally reviewed. Aeration significantly improved Peep/FIO2 needs: Plan Cont low  VT ventilation 6 ml to 8 ml/kg Cont to wean peep/fio2 Goal sats > 88% goal pao2 > 65 pplat goal > 30/ driving pressure goal <76 Cont IV  heparin  PAD protocol RASS goal change to -2 Cont steroid taper  Day 5 vanc and zosyn; think we may be able to stop these and just treat bacteremia and neurosyphilis  PRN CXR Am abg VAP bundle   Circulatory shock. Septic shock d/t Corynebacterium Pseudodiptheriae bacteremia and complicated by sedation related hypotension  Plan Keep euvolemic  CVP from PICC Asking ID to see for ABX assist   Acute metabolic encephalopathy Neuro-syphilis (presumtive dx)  -EEG negative for seizures -her RPR was reactive and titer 1:1 w/ T Pallidum Abs Positive Ideally would need LP to rule out neuro syphilis; spoke w/ ID who recommended treating as neuro-syphilis  Plan Cont keppra PAD protocol PCN g continuous infusion started 12/29 will be 10-14d course   AKI Scr climbing still UOP:  Plan Avoid hypotension Keep euvolemic Renal dose meds   Fluid and electrolyte imbalance: Hypernatremia: Now normalized, nonanion gap metabolic acidosis Plan Awaiting am chemistry Cont current free water  Daily chem Replace as indicated  Transaminitis: Slowly improving Plan Am cmp   Chronic CVA -Family stated no prior history of CVA -CT showing areas of encephalomalacia consistent with chronic CVA in the left parieto-occipital region Plan Supportive care  Hypothyroidism Plan synthroid  Severe hyperglycemia. Exacerbated by steroids Plan Cont ssi, inc TF coverage to 8 units Goal 140 to 180  Best practice (evaluated daily)  Diet: start tubefeeds 12/29 Pain/Anxiety/Delirium protocol (if indicated): on Prop and fent as of 12/29-->changing RASS goal 12/20 VAP protocol (if indicated):ordered DVT prophylaxis: On full dose anticoagulation GI prophylaxis: PPI  Glucose control: SSI; increased to resistant and added TF coverage 12/29; adjusted 12/30 Mobility: Bedrest Disposition: ICU Tubes/lines: picc 12/28 RUE-->still needed. Foley 12/28 (still needed-->indication rising creatinine and critical  illness)  Goals of Care:  Last date of multidisciplinary goals of care discussion: Synetta Fail --> daughter called. Updated still desire full scope of care.  Code Status: Full code   My cct 32 minutes  [name] ACNP-BC [facility] Pulmonary/Critical Care Pager # [phone] OR # [phone] if no answer

## 2020-12-10 NOTE — Progress Notes (Addendum)
ID Brief Note  MRSA PCR is negative. Consider stopping Vancomycin in the setting of AKI.  Can ontinue Pip/tazo for around 5 days total for possible VAP given improvement in chest xray findings   The corynebacterium pseudodiptheriae spp isolated from both sets of blood cultures on 12/26 are likely from one peripheral stick and not separate needle sticks. They are drawn at same time ( 1323). High suspicion for this to be contaminant. In either case, Penicillin being used for Neurosyphilis has reasonable activity against this organism  Repeat one more sets of blood cultures   Continue Pen G for neurosyphilis given logistic difficulty to have an LP done in the setting of COVID PNA and no h/o treatment of syphilis in the past  Full consult note to follow   Dr Ninetta Lights to follow tomorrow  Odette Fraction, MD [facility]

## 2020-12-11 DIAGNOSIS — A539 Syphilis, unspecified: Secondary | ICD-10-CM | POA: Diagnosis not present

## 2020-12-11 DIAGNOSIS — U071 COVID-19: Secondary | ICD-10-CM | POA: Diagnosis not present

## 2020-12-11 DIAGNOSIS — J9600 Acute respiratory failure, unspecified whether with hypoxia or hypercapnia: Secondary | ICD-10-CM | POA: Diagnosis not present

## 2020-12-11 LAB — COMPREHENSIVE METABOLIC PANEL
ALT: 29 U/L (ref 0–44)
AST: 26 U/L (ref 15–41)
Albumin: 2.1 g/dL — ABNORMAL LOW (ref 3.5–5.0)
Alkaline Phosphatase: 69 U/L (ref 38–126)
Anion gap: 9 (ref 5–15)
BUN: 57 mg/dL — ABNORMAL HIGH (ref 8–23)
CO2: 23 mmol/L (ref 22–32)
Calcium: 8.9 mg/dL (ref 8.9–10.3)
Chloride: 112 mmol/L — ABNORMAL HIGH (ref 98–111)
Creatinine, Ser: 1.52 mg/dL — ABNORMAL HIGH (ref 0.44–1.00)
GFR, Estimated: 37 mL/min — ABNORMAL LOW (ref >60)
Glucose, Bld: 148 mg/dL — ABNORMAL HIGH (ref 70–99)
Potassium: 3.6 mmol/L (ref 3.5–5.1)
Sodium: 144 mmol/L (ref 135–145)
Total Bilirubin: 0.6 mg/dL (ref 0.3–1.2)
Total Protein: 5.8 g/dL — ABNORMAL LOW (ref 6.5–8.1)

## 2020-12-11 LAB — CBC WITH DIFFERENTIAL/PLATELET
Abs Immature Granulocytes: 1.66 10*3/uL — ABNORMAL HIGH (ref 0.00–0.07)
Basophils Absolute: 0.1 10*3/uL (ref 0.0–0.1)
Basophils Relative: 0 %
Eosinophils Absolute: 0.2 10*3/uL (ref 0.0–0.5)
Eosinophils Relative: 1 %
HCT: 24.8 % — ABNORMAL LOW (ref 36.0–46.0)
Hemoglobin: 7.7 g/dL — ABNORMAL LOW (ref 12.0–15.0)
Immature Granulocytes: 6 %
Lymphocytes Relative: 5 %
Lymphs Abs: 1.2 10*3/uL (ref 0.7–4.0)
MCH: 34.4 pg — ABNORMAL HIGH (ref 26.0–34.0)
MCHC: 31 g/dL (ref 30.0–36.0)
MCV: 110.7 fL — ABNORMAL HIGH (ref 80.0–100.0)
Monocytes Absolute: 1.2 10*3/uL — ABNORMAL HIGH (ref 0.1–1.0)
Monocytes Relative: 5 %
Neutro Abs: 21.5 10*3/uL — ABNORMAL HIGH (ref 1.7–7.7)
Neutrophils Relative %: 83 %
Platelets: 213 10*3/uL (ref 150–400)
RBC: 2.24 MIL/uL — ABNORMAL LOW (ref 3.87–5.11)
RDW: 13.8 % (ref 11.5–15.5)
WBC: 25.8 10*3/uL — ABNORMAL HIGH (ref 4.0–10.5)
nRBC: 1.3 % — ABNORMAL HIGH (ref 0.0–0.2)

## 2020-12-11 LAB — GLUCOSE, CAPILLARY
Glucose-Capillary: 141 mg/dL — ABNORMAL HIGH (ref 70–99)
Glucose-Capillary: 144 mg/dL — ABNORMAL HIGH (ref 70–99)
Glucose-Capillary: 145 mg/dL — ABNORMAL HIGH (ref 70–99)
Glucose-Capillary: 167 mg/dL — ABNORMAL HIGH (ref 70–99)
Glucose-Capillary: 189 mg/dL — ABNORMAL HIGH (ref 70–99)
Glucose-Capillary: 234 mg/dL — ABNORMAL HIGH (ref 70–99)

## 2020-12-11 LAB — D-DIMER, QUANTITATIVE: D-Dimer, Quant: 1.74 ug/mL-FEU — ABNORMAL HIGH (ref 0.00–0.50)

## 2020-12-11 LAB — HEPARIN LEVEL (UNFRACTIONATED)
Heparin Unfractionated: 0.23 IU/mL — ABNORMAL LOW (ref 0.30–0.70)
Heparin Unfractionated: 0.26 IU/mL — ABNORMAL LOW (ref 0.30–0.70)
Heparin Unfractionated: 0.51 IU/mL (ref 0.30–0.70)

## 2020-12-11 LAB — C-REACTIVE PROTEIN: CRP: 22.6 mg/dL — ABNORMAL HIGH (ref <1.0)

## 2020-12-11 LAB — FERRITIN: Ferritin: 4726 ng/mL — ABNORMAL HIGH (ref 11–307)

## 2020-12-11 LAB — MAGNESIUM: Magnesium: 2.4 mg/dL (ref 1.7–2.4)

## 2020-12-11 LAB — VITAMIN B1: Vitamin B1 (Thiamine): 156.5 nmol/L (ref 66.5–200.0)

## 2020-12-11 MED ORDER — ARTIFICIAL TEARS OPHTHALMIC OINT
1.0000 "application " | TOPICAL_OINTMENT | Freq: Three times a day (TID) | OPHTHALMIC | Status: DC
  Administered 2020-12-11 – 2020-12-18 (×21): 1 via OPHTHALMIC
  Filled 2020-12-11: qty 3.5

## 2020-12-11 MED ORDER — FENTANYL 2500MCG IN NS 250ML (10MCG/ML) PREMIX INFUSION
25.0000 ug/h | INTRAVENOUS | Status: DC
  Administered 2020-12-11: 275 ug/h via INTRAVENOUS
  Administered 2020-12-11: 300 ug/h via INTRAVENOUS
  Administered 2020-12-12 (×2): 225 ug/h via INTRAVENOUS
  Administered 2020-12-13 – 2020-12-14 (×3): 200 ug/h via INTRAVENOUS
  Administered 2020-12-15 (×3): 275 ug/h via INTRAVENOUS
  Administered 2020-12-16 (×2): 300 ug/h via INTRAVENOUS
  Administered 2020-12-16: 275 ug/h via INTRAVENOUS
  Administered 2020-12-17: 300 ug/h via INTRAVENOUS
  Administered 2020-12-17 – 2020-12-18 (×3): 250 ug/h via INTRAVENOUS
  Filled 2020-12-11 (×20): qty 250

## 2020-12-11 MED ORDER — FENTANYL BOLUS VIA INFUSION
25.0000 ug | INTRAVENOUS | Status: DC | PRN
  Administered 2020-12-13 – 2020-12-14 (×5): 50 ug via INTRAVENOUS
  Filled 2020-12-11: qty 50

## 2020-12-11 MED ORDER — FENTANYL BOLUS VIA INFUSION
25.0000 ug | INTRAVENOUS | Status: DC | PRN
  Filled 2020-12-11: qty 25

## 2020-12-11 MED ORDER — PROPOFOL 1000 MG/100ML IV EMUL
25.0000 ug/kg/min | INTRAVENOUS | Status: DC
  Administered 2020-12-11 – 2020-12-14 (×15): 40 ug/kg/min via INTRAVENOUS
  Administered 2020-12-14 – 2020-12-15 (×3): 50 ug/kg/min via INTRAVENOUS
  Filled 2020-12-11 (×17): qty 100

## 2020-12-11 NOTE — Progress Notes (Signed)
All pt belongings picked up by patient's daughter, Synetta Fail at main hospital entrance. (See Screenings flowsheet for details).

## 2020-12-11 NOTE — Progress Notes (Signed)
Assisted tele visit to patient with daughter.  Margaret Pyle, RN

## 2020-12-11 NOTE — Progress Notes (Signed)
Brief Pharmacy Anticoagulation note:  For full details see note from [facility]  [facility]: HL 0.51 therapeutic on 1200 units/hr No bleeding or line issues noted Obtain confirmatory heparin level with am labs  Arley Phenix RPh 12/11/2020, 10:25 PM

## 2020-12-11 NOTE — TOC Progression Note (Signed)
Transition of Care [facility]) - Progression Note    Patient Details  Name: [name] MRN: [id number] Date of Birth: 1951/07/27  Transition of Care [facility]) CM/SW Contact  Golda Acre, RN Phone Number: 12/11/2020, 9:36 AM  Clinical Narrative:    Patient with Covid pneumonia, acute hypoxemic respiratory failure  No overnight events    Remains on full ventilator support, fully sedated  Ventilator tolerance appears stable  Decreasing FiO2  S1-S2 appreciated    Rhonchi bilaterally   PLan snf when stable or ltach   Expected Discharge Plan: Home/Self Care Barriers to Discharge: No Barriers Identified  Expected Discharge Plan and Services Expected Discharge Plan: Home/Self Care       Living arrangements for the past 2 months: Single Family Home                                       Social Determinants of Health (SDOH) Interventions    Readmission Risk Interventions No flowsheet data found.

## 2020-12-11 NOTE — Progress Notes (Addendum)
INFECTIOUS DISEASE PROGRESS NOTE  ID: [name] is a 69 y.o. female with  Principal Problem:   Acute respiratory failure due to COVID-19 [facility]) Active Problems:   Hypothyroidism   HYPERTENSION, BENIGN ESSENTIAL   Cigarette smoker   Acute hypoxemic respiratory failure due to COVID-19 [facility])   ARDS (adult respiratory distress syndrome) ([place])  Subjective: On vent, no response  Abtx:  Anti-infectives (From admission, onward)   Start     Dose/Rate Route Frequency Ordered Stop   12/09/20 1300  penicillin G potassium 9 Million Units in dextrose 5 % 500 mL continuous infusion        9 Million Units 41.7 mL/hr over 12 Hours Intravenous Every 12 hours 12/09/20 1146     12/07/20 1800  vancomycin (VANCOCIN) IVPB 1000 mg/200 mL premix  Status:  Discontinued        1,000 mg 200 mL/hr over 60 Minutes Intravenous Every 24 hours 12/06/20 1455 12/10/20 1518   12/06/20 1600  piperacillin-tazobactam (ZOSYN) IVPB 3.375 g  Status:  Discontinued        3.375 g 12.5 mL/hr over 240 Minutes Intravenous Every 8 hours 12/06/20 1455 12/10/20 1518   12/06/20 1530  vancomycin (VANCOREADY) IVPB 1500 mg/300 mL        1,500 mg 150 mL/hr over 120 Minutes Intravenous  Once 12/06/20 1454 12/06/20 1749   11/30/20 1000  remdesivir 100 mg in sodium chloride 0.9 % 100 mL IVPB  Status:  Discontinued       "Followed by" Linked Group Details   100 mg 200 mL/hr over 30 Minutes Intravenous Daily 12/09/2020 2150 12/06/2020 2154   11/30/20 1000  remdesivir 100 mg in sodium chloride 0.9 % 100 mL IVPB        100 mg 200 mL/hr over 30 Minutes Intravenous Daily 11/27/2020 2138 12/03/20 0917   11/17/2020 2230  remdesivir 200 mg in sodium chloride 0.9% 250 mL IVPB        200 mg 580 mL/hr over 30 Minutes Intravenous Once 11/17/2020 2138 11/30/20 0007   12/07/2020 2200  azithromycin (ZITHROMAX) 500 mg in sodium chloride 0.9 % 250 mL IVPB  Status:  Discontinued        500 mg 250 mL/hr over 60 Minutes Intravenous Every 24 hours  12/02/2020 2150 12/03/20 1247   12/01/2020 2130  remdesivir 200 mg in sodium chloride 0.9% 250 mL IVPB  Status:  Discontinued       "Followed by" Linked Group Details   200 mg 580 mL/hr over 30 Minutes Intravenous Once 11/11/2020 2150 11/23/2020 2154      Medications:  Scheduled: . artificial tears  1 application Both Eyes L4T  . chlorhexidine gluconate (MEDLINE KIT)  15 mL Mouth Rinse BID  . Chlorhexidine Gluconate Cloth  6 each Topical Q0600  . feeding supplement (PROSource TF)  45 mL Per Tube Daily  . feeding supplement (PROSource TF)  90 mL Per Tube BID  . feeding supplement (VITAL HIGH PROTEIN)  1,000 mL Per Tube Q24H  . folic acid  1 mg Per Tube Daily  . insulin aspart  0-20 Units Subcutaneous Q4H  . insulin aspart  8 Units Subcutaneous Q4H  . levothyroxine  62.5 mcg Intravenous Daily  . mouth rinse  15 mL Mouth Rinse 10 times per day  . methylPREDNISolone (SOLU-MEDROL) injection  40 mg Intravenous Q24H  . multivitamin  15 mL Per Tube Daily  . pantoprazole sodium  40 mg Per Tube Q1200  . sodium chloride flush  10-40  mL Intracatheter Q12H  . thiamine  100 mg Per Tube Daily   Or  . thiamine  100 mg Intravenous Daily    Objective: Vital signs in last 24 hours: Temp:  [97.7 F (36.5 C)-98.9 F (37.2 C)] 98.8 F (37.1 C) (12/31 1137) Pulse Rate:  [54-89] 81 (12/31 1400) Resp:  [15-31] 18 (12/31 1400) BP: (93-132)/(52-70) 102/54 (12/31 1400) SpO2:  [86 %-100 %] 93 % (12/31 1400) FiO2 (%):  [50 %-70 %] 60 % (12/31 1237)   General appearance: no distress Resp: diminished breath sounds anterior - bilateral and rhonchi anterior - bilateral Cardio: regular rate and rhythm GI: normal findings: bowel sounds normal and soft, non-tender Extremities: edema trace, LUE PIC clena, no erythema.   Lab Results Recent Labs    12/10/20 0424 12/10/20 0435 12/11/20 0510  WBC 27.9*  --  25.8*  HGB 8.4*  --  7.7*  HCT 26.8*  --  24.8*  NA  --  141 144  K  --  3.8 3.6  CL  --  111 112*   CO2  --  22 23  BUN  --  50* 57*  CREATININE 1.76* 1.65* 1.52*   Liver Panel Recent Labs    12/10/20 0435 12/11/20 0510  PROT 5.6* 5.8*  ALBUMIN 2.1* 2.1*  AST 22 26  ALT 39 29  ALKPHOS 69 69  BILITOT 0.6 0.6   Sedimentation Rate No results for input(s): ESRSEDRATE in the last 72 hours. C-Reactive Protein Recent Labs    12/10/20 0424 12/11/20 0510  CRP 18.1* 22.6*    Microbiology: Recent Results (from the past 240 hour(s))  Culture, blood (routine x 2)     Status: Abnormal   Collection Time: 12/06/20  1:23 PM   Specimen: BLOOD  Result Value Ref Range Status   Specimen Description   Final    BLOOD BLOOD Performed at [facility] [address], [place], [place] [address]    Special Requests   Final    Blood Culture adequate volume Performed at [facility] [address] [place], [place] [address]    Culture  Setup Time   Final    GRAM POSITIVE RODS AEROBIC BOTTLE ONLY CRITICAL RESULT CALLED TO, READ BACK BY AND VERIFIED WITH: H. JACKSON PHARMD, AT 0700 12/08/20 BY D. VANHOOK    Culture (A)  Final    CORYNEBACTERIUM PSEUDODIPHTHERIAE Standardized susceptibility testing for this organism is not available. Performed at [facility], [place] [address] [place], [place] [address]    Report Status 12/10/2020 FINAL  Final  Culture, blood (routine x 2)     Status: Abnormal   Collection Time: 12/06/20  1:23 PM   Specimen: BLOOD  Result Value Ref Range Status   Specimen Description   Final    BLOOD BLOOD RIGHT HAND BLOOD LEFT HAND Performed at [facility] [address] [place], [place] [address]    Special Requests   Final    Blood Culture adequate volume Performed at [facility] [address], [place], [place] [address]    Culture  Setup Time   Final    GRAM POSITIVE RODS AEROBIC BOTTLE ONLY CRITICAL RESULT CALLED TO, READ BACK BY AND VERIFIED WITH: H. JACKSON PHARMD, AT  0700 12/08/20 BY D. VANHOOK    Culture (A)  Final    CORYNEBACTERIUM PSEUDODIPHTHERIAE Standardized susceptibility testing for this organism is not available. Performed at [facility], [place]  [address], [place], [place] [address]    Report Status 12/10/2020 FINAL  Final  MRSA PCR Screening     Status: None   Collection Time: 12/06/20  2:25 PM   Specimen: Nasal Mucosa; Nasopharyngeal  Result Value Ref Range Status   MRSA by PCR NEGATIVE NEGATIVE Final    Comment:        The GeneXpert MRSA Assay (FDA approved for NASAL specimens only), is one component of a comprehensive MRSA colonization surveillance program. It is not intended to diagnose MRSA infection nor to guide or monitor treatment for MRSA infections. Performed at [facility], [place] [address], [place], [place] [address]   Culture, blood (Routine X 2) w Reflex to ID Panel     Status: None (Preliminary result)   Collection Time: 12/10/20  5:01 PM   Specimen: BLOOD  Result Value Ref Range Status   Specimen Description   Final    BLOOD LEFT ANTECUBITAL Performed at [facility] [address], [place], [place] [address]    Special Requests   Final    BOTTLES DRAWN AEROBIC ONLY Blood Culture adequate volume Performed at [facility] [address], [place], [place] [address]    Culture   Final    NO GROWTH < 12 HOURS Performed at [facility] [address] [place], [place] [address]    Report Status PENDING  Incomplete  Culture, blood (Routine X 2) w Reflex to ID Panel     Status: None (Preliminary result)   Collection Time: 12/10/20  5:01 PM   Specimen: BLOOD LEFT FOREARM  Result Value Ref Range Status   Specimen Description   Final    BLOOD LEFT FOREARM Performed at [facility] [address] [place], [place] [address]    Special Requests   Final    BOTTLES DRAWN AEROBIC ONLY Blood Culture adequate volume Performed at  [facility] [address], [place], [place] [address]    Culture   Final    NO GROWTH < 12 HOURS Performed at [facility] [address] [place], [place] [address]    Report Status PENDING  Incomplete    Studies/Results: DG Chest Port 1 View  Result Date: 12/10/2020 CLINICAL DATA:  COVID pneumonia, ARDS EXAM: PORTABLE CHEST 1 VIEW COMPARISON:  12/07/2020 FINDINGS: Endotracheal tube is seen 4.7 cm above the carina. Right upper extremity PICC line tip noted at the superior cavoatrial junction. Nasogastric tube extends into the upper abdomen beyond the margin of the examination. Pulmonary insufflation has improved. Superimposed diffuse pulmonary infiltrate, more focal at the lung bases bilaterally, appears stable. No pneumothorax. Tiny left pleural effusion. Cardiac size within normal limits. IMPRESSION: Stable support lines and tubes. Improved pulmonary insufflation. Stable diffuse pulmonary infiltrate. Electronically Signed   By: Fidela Salisbury MD   On: 12/10/2020 05:57     Assessment/Plan: Neurosyphilis Corynebacterium BCx COVID/PNA AKI  Total days of antibiotics: Penicillin Day 3, Zosyn Day 5 (off) and vancomycin day 5 (off)          Cr stable, WBC slightly better Would aim for 14 days of Pen G IV.  Alternatively, could call the [place] [facility] hotline for hx of treatment (if prev treated, could explain her RPR). [phone]). they are not answering phone today and not on w/e.   Available as needed.   Bobby Rumpf MD, FACP Infectious Diseases (pager) [phone] [email or web address] 12/11/2020, 2:20 PM  LOS: 12 days

## 2020-12-11 NOTE — Progress Notes (Signed)
NAME:  [name], MRN:  [id number], DOB:  04-21-1951, LOS: 12 ADMISSION DATE:  11/16/2020, CONSULTATION DATE: 12/06/2020 REFERRING MD: Dr. Allena Katz, [facility] hospitalist, CHIEF COMPLAINT: Encephalopathy, hypoxia   Brief History:  69 year old smoker admitted 12/19 with acute hypoxic respiratory failure due to COVID 19 viral pneumonia.  Has been on nonrebreather and high flow nasal cannula. Developed acute encephalopathy 12/26 and lactic acidosis requiring transfer to ICU  Past Medical History:   Past Medical History:  Diagnosis Date  . Arthritis   . Depression   . GERD (gastroesophageal reflux disease)   . Hypertension   . Thyroid disease   . Tobacco abuse     Significant Hospital Events:   12/19 hospital admission 12/23 completed remdesivir 12/26 transferred to ICU, broad-spectrum antibiotics initiated.  Baricitinib stopped, cultures sent, eventually came back as gram-positive rods in 2 samples, CT of chest positive for pulmonary emboli systemic) started 12/27-intubated, required prone positioning 12/29: FiO2 and PEEP requirements improving, tapering steroids.  Still on pressors.  Escalating sliding scale for significant hyperglycemia. Started cont PCN G infusion as RPR was reactive and titer 1:1 w/ T Pallidum Abs Positive w/ delirium decided to treat as neurosyphilis (per ID suggestion).  12/30 BC finalized w/  CORYNEBACTERIUM PSEUDODIPHTHERIAE. Asked ID to formally see for ABX suggestions. Peep?FIO2 needs as well as CXR better. Renal fxn little worse   Consults:  PCCM-12/26  Procedures:  Endotracheal intubation 12/27  Significant Diagnostic Tests:  Head CT 12/26 encephalomalacia related to remote infarcts in left parietal occipital, right occipital and left temporal lobe -new compared to 2014.  CT angiogram chest 12/26 acute pulmonary emboli within segmental branches of left lower lobe , small clot burden, no evidence of RV strain  CT angiogram chest 12/21 no  evidence of pulmonary embolus, diffuse interlobular septal thickening with GGO especially basis  Micro Data:  Blood culture 12/19 1/4 staff hominis Blood cultures x2 on 12/26: CORYNEBACTERIUM PSEUDODIPHTHERIAE RPR reactive, T. pallidum positive  Antimicrobials:  Zosyn 12/26-12/30 Vancomycin 12/26-12/30 Cont PCN G infusion 12/29>>> 14-day treatment planned  Interim History / Subjective:  Sedated, intermittent paralytics still required Oxygen requirements better.  Objective   Blood pressure 100/60, pulse 85, temperature 98 F (36.7 C), temperature source Axillary, resp. rate 18, height 5\' 7"  (1.702 m), weight 75.5 kg, SpO2 91 %. CVP:  [9 mmHg] 9 mmHg  Vent Mode: PRVC FiO2 (%):  [50 %-70 %] 60 % Set Rate:  [24 bmp] 24 bmp Vt Set:  [490 mL] 490 mL PEEP:  [10 cmH20-12 cmH20] 12 cmH20 Plateau Pressure:  [22 cmH20-25 cmH20] 23 cmH20   Intake/Output Summary (Last 24 hours) at 12/11/2020 1614 Last data filed at 12/11/2020 1334 Gross per 24 hour  Intake 2760.17 ml  Output 1690 ml  Net 1070.17 ml   Filed Weights   12/01/20 1426 12/08/20 0400 12/10/20 0500  Weight: 75.9 kg 71 kg 75.5 kg    Examination: General 69-year-on full vent support HEENT normocephalic, atraumatic  Pulmonary: bilateral rhonchi  Cardiac regular rate and rhythm Abdomen: Bowel sounds appreciated Extremities are warm and dry with strong pulses Neuro still heavily sedated  Resolved Hospital Problem list   Thrombocytopenia   Assessment & Plan:  Acute hypoxic respiratory failure due to Covid pneumonia, ARDS Pulmonary embolism Continue mechanical ventilation per ARDS protocol Target TVol 6-8cc/kgIBW Target Plateau Pressure < 30cm H20 Target driving pressure less than 15 cm of water Target PaO2 55-65: titrate PEEP/FiO2 per protocol As long as PaO2 to FiO2 ratio is less than  1:150 position in prone position for 16 hours a day Ventilator associated pneumonia prevention protocol  Circulatory shock Septic  shock -Antibiotics on hold at present  Metabolic encephalopathy Presumptive diagnosis of neurosyphilis -Continue penicillin -Appreciate infectious disease input -On Keppra-possible seizures when she initially came in   Acute kidney injury -Avoid hypotension -Renal dose medications  Non anion gap metabolic acidosis -Continue to monitor -Replete electrolytes  Chronic CVA -Family stated no prior history of CVA -CT showing areas of encephalomalacia consistent with chronic CVA in the left parieto-occipital region Plan Supportive care  Leukocytosis -Stable -Off vancomycin and Zosyn  Hypothyroidism Plan synthroid  Hypoglycemia -Continue SSI  Best practice (evaluated daily)  Diet: Continue tube feeds Pain/Anxiety/Delirium protocol (if indicated): Propofol, fentanyl, intermittent paralytics  VAP protocol (if indicated):ordered DVT prophylaxis: On full dose anticoagulation GI prophylaxis: PPI  Glucose control: SSI; increased to resistant and added TF coverage 12/29; adjusted 12/30 Mobility: Bedrest Disposition: ICU Tubes/lines: picc 12/28 RUE-->still needed. Foley 12/28 (still needed-->indication rising creatinine and critical illness)  Goals of Care:  Last date of multidisciplinary goals of care discussion: Rodena Piety --> daughter called. Updated still desire full scope of care.  Code Status: Full code   The patient is critically ill with multiple organ systems failure and requires high complexity decision making for assessment and support, frequent evaluation and titration of therapies, application of advanced monitoring technologies and extensive interpretation of multiple databases. Critical Care Time devoted to patient care services described in this note independent of APP/resident time (if applicable)  is 35 minutes.   Sherrilyn Rist MD [facility] Critical Care Personal pager: [phone] If unanswered, please page CCM On-call: [phone]

## 2020-12-11 NOTE — Progress Notes (Addendum)
ANTICOAGULATION CONSULT NOTE - Follow Up Consult  Pharmacy Consult for IV heparin Indication: pulmonary embolus  No Known Allergies  Patient Measurements: Height: 5\' 7"  (170.2 cm) Weight: 75.5 kg (166 lb 7.2 oz) IBW/kg (Calculated) : 61.6 Heparin Dosing Weight: actual body weight   Vital Signs: Temp: 98.6 F (37 C) (12/31 0000) Temp Source: Bladder (12/31 0000) BP: 105/53 (12/31 0730) Pulse Rate: 88 (12/31 0730)  Labs: Recent Labs    12/09/20 0451 12/10/20 0424 12/10/20 0435 12/11/20 0510  HGB 9.7* 8.4*  --  7.7*  HCT 30.8* 26.8*  --  24.8*  PLT 220 246  --  213  HEPARINUNFRC 0.58 0.42  --  0.23*  CREATININE 1.52* 1.76* 1.65* 1.52*   Estimated Creatinine Clearance: 37.6 mL/min (A) (by C-G formula based on SCr of 1.52 mg/dL (H)).  Medical History: Past Medical History:  Diagnosis Date  . Arthritis   . Depression   . GERD (gastroesophageal reflux disease)   . Hypertension   . Thyroid disease   . Tobacco abuse    Assessment: [name] is a 69 y.o. female admitted on 12/09/2020 with COVID pneumonia. D-dimer increased to > 20 on 12/21. CTa chest on 12/21 negative for PE. Lower extremity venous duplex on 12/22 negative for DVT. Developed acute encephalopathy 12/26 and lactic acidosis requiring transfer to ICU. CTa chest today + for acute PE within posterior segmental branches of the left lower lobe, no evidence of RV strain. Pharmacy consulted for IV heparin dosing. Patient has been on Enoxaparin 40mg  SQ q24h for VTE prophylaxis  12/11/20  Heparin level = 0.23 subtherapeutic on 850 units/hr  Hgb trending down, no overt bleeding reported; PLT ok  No bleeding or line issues noted   Goal of Therapy:  Heparin level 0.3-0.7 units/ml Monitor platelets by anticoagulation protocol: Yes   Plan:  Increase heparin infusion to 1000 units/hr Recheck a HL in 6 hours Daily CBC, heparin level Monitor closely for s/sx of bleeding  12/11/2020, 7:36  AM  PM update:   HL remains below goal.  Infusion not paused since rate increase per rn No signs of bleeding per RN Increase heparin infusion to 1200 units/hr and recheck a HL in 6 hours.   Valentina Gu D  12/11/20 3:18 PM

## 2020-12-12 DIAGNOSIS — U071 COVID-19: Secondary | ICD-10-CM | POA: Diagnosis not present

## 2020-12-12 DIAGNOSIS — J9600 Acute respiratory failure, unspecified whether with hypoxia or hypercapnia: Secondary | ICD-10-CM | POA: Diagnosis not present

## 2020-12-12 LAB — COMPREHENSIVE METABOLIC PANEL
ALT: 24 U/L (ref 0–44)
AST: 25 U/L (ref 15–41)
Albumin: 2.1 g/dL — ABNORMAL LOW (ref 3.5–5.0)
Alkaline Phosphatase: 82 U/L (ref 38–126)
Anion gap: 11 (ref 5–15)
BUN: 58 mg/dL — ABNORMAL HIGH (ref 8–23)
CO2: 22 mmol/L (ref 22–32)
Calcium: 9.2 mg/dL (ref 8.9–10.3)
Chloride: 109 mmol/L (ref 98–111)
Creatinine, Ser: 1.48 mg/dL — ABNORMAL HIGH (ref 0.44–1.00)
GFR, Estimated: 38 mL/min — ABNORMAL LOW (ref >60)
Glucose, Bld: 157 mg/dL — ABNORMAL HIGH (ref 70–99)
Potassium: 3.6 mmol/L (ref 3.5–5.1)
Sodium: 142 mmol/L (ref 135–145)
Total Bilirubin: 0.4 mg/dL (ref 0.3–1.2)
Total Protein: 6.5 g/dL (ref 6.5–8.1)

## 2020-12-12 LAB — CBC WITH DIFFERENTIAL/PLATELET
Abs Immature Granulocytes: 1.99 10*3/uL — ABNORMAL HIGH (ref 0.00–0.07)
Basophils Absolute: 0.2 10*3/uL — ABNORMAL HIGH (ref 0.0–0.1)
Basophils Relative: 1 %
Eosinophils Absolute: 0.3 10*3/uL (ref 0.0–0.5)
Eosinophils Relative: 1 %
HCT: 25.9 % — ABNORMAL LOW (ref 36.0–46.0)
Hemoglobin: 8.1 g/dL — ABNORMAL LOW (ref 12.0–15.0)
Immature Granulocytes: 8 %
Lymphocytes Relative: 5 %
Lymphs Abs: 1.3 10*3/uL (ref 0.7–4.0)
MCH: 35.1 pg — ABNORMAL HIGH (ref 26.0–34.0)
MCHC: 31.3 g/dL (ref 30.0–36.0)
MCV: 112.1 fL — ABNORMAL HIGH (ref 80.0–100.0)
Monocytes Absolute: 1 10*3/uL (ref 0.1–1.0)
Monocytes Relative: 4 %
Neutro Abs: 21.9 10*3/uL — ABNORMAL HIGH (ref 1.7–7.7)
Neutrophils Relative %: 81 %
Platelets: 239 10*3/uL (ref 150–400)
RBC: 2.31 MIL/uL — ABNORMAL LOW (ref 3.87–5.11)
RDW: 14.6 % (ref 11.5–15.5)
WBC: 26.5 10*3/uL — ABNORMAL HIGH (ref 4.0–10.5)
nRBC: 1.3 % — ABNORMAL HIGH (ref 0.0–0.2)

## 2020-12-12 LAB — MAGNESIUM: Magnesium: 2.3 mg/dL (ref 1.7–2.4)

## 2020-12-12 LAB — FERRITIN: Ferritin: 4680 ng/mL — ABNORMAL HIGH (ref 11–307)

## 2020-12-12 LAB — GLUCOSE, CAPILLARY
Glucose-Capillary: 116 mg/dL — ABNORMAL HIGH (ref 70–99)
Glucose-Capillary: 124 mg/dL — ABNORMAL HIGH (ref 70–99)
Glucose-Capillary: 128 mg/dL — ABNORMAL HIGH (ref 70–99)
Glucose-Capillary: 160 mg/dL — ABNORMAL HIGH (ref 70–99)
Glucose-Capillary: 165 mg/dL — ABNORMAL HIGH (ref 70–99)
Glucose-Capillary: 199 mg/dL — ABNORMAL HIGH (ref 70–99)

## 2020-12-12 LAB — BLOOD GAS, ARTERIAL
Acid-base deficit: 3 mmol/L — ABNORMAL HIGH (ref 0.0–2.0)
Bicarbonate: 23.9 mmol/L (ref 20.0–28.0)
Drawn by: 29503
FIO2: 50
MECHVT: 490 mL
O2 Saturation: 90.3 %
PEEP: 10 cmH2O
Patient temperature: 98.6
RATE: 24 resp/min
pCO2 arterial: 55.1 mmHg — ABNORMAL HIGH (ref 32.0–48.0)
pH, Arterial: 7.26 — ABNORMAL LOW (ref 7.350–7.450)
pO2, Arterial: 68.7 mmHg — ABNORMAL LOW (ref 83.0–108.0)

## 2020-12-12 LAB — HEPARIN LEVEL (UNFRACTIONATED): Heparin Unfractionated: 0.47 IU/mL (ref 0.30–0.70)

## 2020-12-12 LAB — C-REACTIVE PROTEIN: CRP: 33.2 mg/dL — ABNORMAL HIGH (ref <1.0)

## 2020-12-12 LAB — D-DIMER, QUANTITATIVE: D-Dimer, Quant: 1.83 ug/mL-FEU — ABNORMAL HIGH (ref 0.00–0.50)

## 2020-12-12 NOTE — Progress Notes (Signed)
Pt became dyssynchronous with the ventilator when FiO2 reduced to 50% by RT. This RN increased sedation gtts and gave PRN versed with no effect. PRN Vecuronium then given per PRN orders. Pt is now synchronous, with O2 sat currently 90%. This RN will continue to carefully monitor pt.

## 2020-12-12 NOTE — Progress Notes (Signed)
NAME:  [name], MRN:  [id number], DOB:  [birth year], LOS: 13 ADMISSION DATE:  12-17-2020, CONSULTATION DATE: 12/06/2020 REFERRING MD: Dr. Allena Katz, [facility] hospitalist, CHIEF COMPLAINT: Encephalopathy, hypoxia   Brief History:  70 year old smoker admitted 12/19 with acute hypoxic respiratory failure due to COVID 19 viral pneumonia.  Has been on nonrebreather and high flow nasal cannula. Developed acute encephalopathy 12/26 and lactic acidosis requiring transfer to ICU  Past Medical History:   Past Medical History:  Diagnosis Date  . Arthritis   . Depression   . GERD (gastroesophageal reflux disease)   . Hypertension   . Thyroid disease   . Tobacco abuse     Significant Hospital Events:   12/19 hospital admission 12/23 completed remdesivir 12/26 transferred to ICU, broad-spectrum antibiotics initiated.  Baricitinib stopped, cultures sent, eventually came back as gram-positive rods in 2 samples, CT of chest positive for pulmonary emboli systemic) started 12/27-intubated, required prone positioning 12/29: FiO2 and PEEP requirements improving, tapering steroids.  Still on pressors.  Escalating sliding scale for significant hyperglycemia. Started cont PCN G infusion as RPR was reactive and titer 1:1 w/ T Pallidum Abs Positive w/ delirium decided to treat as neurosyphilis (per ID suggestion).  12/30 BC finalized w/  CORYNEBACTERIUM PSEUDODIPHTHERIAE. Asked ID to formally see for ABX suggestions. Peep?FIO2 needs as well as CXR better. Renal fxn little worse   Consults:  PCCM-12/26  Procedures:  Endotracheal intubation 12/27  Significant Diagnostic Tests:  Head CT 12/26 encephalomalacia related to remote infarcts in left parietal occipital, right occipital and left temporal lobe -new compared to 2014.  CT angiogram chest 12/26 acute pulmonary emboli within segmental branches of left lower lobe , small clot burden, no evidence of RV strain  CT angiogram chest 12/21 no  evidence of pulmonary embolus, diffuse interlobular septal thickening with GGO especially basis  Micro Data:  Blood culture 12/19 1/4 staff hominis Blood cultures x2 on 12/26: CORYNEBACTERIUM PSEUDODIPHTHERIAE RPR reactive, T. pallidum positive  Antimicrobials:  Zosyn 12/26-12/30 Vancomycin 12/26-12/30 Cont PCN G infusion 12/29>>> 14-day treatment planned  Interim History / Subjective:  Remains sedated, intermittent paralytics still needed for vent dyssynchrony  Objective   Blood pressure 109/64, pulse 82, temperature 98.6 F (37 C), temperature source Axillary, resp. rate (!) 24, height 5\' 7"  (1.702 m), weight 75.5 kg, SpO2 (!) 87 %. CVP:  [9 mmHg-11 mmHg] 9 mmHg  Vent Mode: PRVC FiO2 (%):  [50 %-80 %] 50 % Set Rate:  [24 bmp] 24 bmp Vt Set:  [490 mL] 490 mL PEEP:  [10 cmH20-12 cmH20] 10 cmH20 Plateau Pressure:  [26 cmH20-33 cmH20] 26 cmH20   Intake/Output Summary (Last 24 hours) at 12/12/2020 1212 Last data filed at 12/12/2020 1211 Gross per 24 hour  Intake 2504 ml  Output 1125 ml  Net 1379 ml   Filed Weights   12/01/20 1426 12/08/20 0400 12/10/20 0500  Weight: 75.9 kg 71 kg 75.5 kg    Examination: General: 70 year old lady on full vent support HEENT: Normocephalic, atraumatic Pulmonary: Rhonchi bilaterally, reduced air entry at the bases Cardiac S1-S2 appreciated Abdomen: Soft, bowel sounds appreciated Extremities are warm and dry with strong pulses Sedated, intermittent paralytics  Resolved Hospital Problem list   Thrombocytopenia   Assessment & Plan:  Acute hypoxic respiratory failure secondary to Covid pneumonia ARDS Pulmonary embolism Continue mechanical ventilation per ARDS protocol Target TVol 6-8cc/kgIBW Target Plateau Pressure < 30cm H20 Target driving pressure less than 15 cm of water Target PaO2 55-65: titrate PEEP/FiO2 per protocol As long  as PaO2 to FiO2 ratio is less than 1:150 position in prone position for 16 hours a day Ventilator associated  pneumonia prevention protocol  Septic shock -Off antibiotics and stable  Presumptive diagnosis of neurosyphilis -To continue penicillin for 14 days  Possible seizures when she initially came in -Empirically on Keppra  Acute kidney injury -Renal dose medications -Avoid hypotension  Chronic CVA -CT shows areas of encephalomalacia in the left frontoparietal region -Continue supportive care  Leukocytosis -Stable -No new fever  Hypothyroidism Plan synthroid  Hypoglycemia -Continue SSI  Best practice (evaluated daily)  Diet: Continue tube feeds Pain/Anxiety/Delirium protocol (if indicated): Fentanyl and propofol, intermittent paralytics VAP protocol (if indicated):ordered DVT prophylaxis: On full dose anticoagulation GI prophylaxis: PPI  Glucose control: SSI; increased to resistant and added TF coverage 12/29; adjusted 12/30 Mobility: Bedrest Disposition: ICU Tubes/lines: picc 12/28 RUE-->still needed. Foley 12/28 (still needed-->indication rising creatinine and critical illness)  Goals of Care:  Last date of multidisciplinary goals of care discussion: Rodena Piety --> daughter called. Updated still desire full scope of care.  Code Status: Full code  The patient is critically ill with multiple organ systems failure and requires high complexity decision making for assessment and support, frequent evaluation and titration of therapies, application of advanced monitoring technologies and extensive interpretation of multiple databases. Critical Care Time devoted to patient care services described in this note independent of APP/resident time (if applicable)  is 32 minutes.   Sherrilyn Rist MD [facility] Personal pager: [phone] If unanswered, please page CCM On-call: [phone]

## 2020-12-12 NOTE — Progress Notes (Signed)
Assisted family with tele-visit via elink 

## 2020-12-12 NOTE — Progress Notes (Signed)
eLink Physician-Brief Progress Note Patient Name: [name] DOB: [birth year] MRN: [id number]   Date of Service  12/12/2020  HPI/Events of Note  Patient with multiple liquid stools.  eICU Interventions  Flexiseal ordered.     Intervention Category Minor Interventions: Other:  Migdalia Dk 12/12/2020, 1:55 AM

## 2020-12-12 NOTE — Progress Notes (Signed)
ANTICOAGULATION CONSULT NOTE - Follow Up Consult  Pharmacy Consult for IV heparin Indication: pulmonary embolus  No Known Allergies  Patient Measurements: Height: 5\' 7"  (170.2 cm) Weight: 75.5 kg (166 lb 7.2 oz) IBW/kg (Calculated) : 61.6 Heparin Dosing Weight: actual body weight   Vital Signs: Temp: 98.1 F (36.7 C) (01/01 0000) Temp Source: Axillary (01/01 0000) BP: 117/61 (01/01 0600) Pulse Rate: 91 (01/01 0600)  Labs: Recent Labs    12/10/20 0424 12/10/20 0435 12/11/20 0510 12/11/20 1345 12/11/20 2145 12/12/20 0500  HGB 8.4*  --  7.7*  --   --  8.1*  HCT 26.8*  --  24.8*  --   --  25.9*  PLT 246  --  213  --   --  239  HEPARINUNFRC 0.42  --  0.23* 0.26* 0.51 0.47  CREATININE 1.76* 1.65* 1.52*  --   --  1.48*   Estimated Creatinine Clearance: 38.6 mL/min (A) (by C-G formula based on SCr of 1.48 mg/dL (H)).  Medical History: Past Medical History:  Diagnosis Date  . Arthritis   . Depression   . GERD (gastroesophageal reflux disease)   . Hypertension   . Thyroid disease   . Tobacco abuse    Assessment: [name] is a 70 y.o. female admitted on 11/28/2020 with COVID pneumonia. D-dimer increased to > 20 on 12/21. CTa chest on 12/21 negative for PE. Lower extremity venous duplex on 12/22 negative for DVT. Developed acute encephalopathy 12/26 and lactic acidosis requiring transfer to ICU. CTa chest today + for acute PE within posterior segmental branches of the left lower lobe, no evidence of RV strain. Pharmacy consulted for IV heparin dosing. Patient has been on Enoxaparin 40mg  SQ q24h for VTE prophylaxis  12/12/20  Confirmatory heparin level remains therapeutic on 1200 units/hr  Hgb low but remains stable; Plt stable WNL  No bleeding or line issues noted per RN  Goal of Therapy:  Heparin level 0.3-0.7 units/ml Monitor platelets by anticoagulation protocol: Yes   Plan:   Continue heparin infusion at 1200 units/hr  Daily CBC, heparin  level  Monitor closely for s/sx of bleeding  Given shortage of IV heparin, consider converting patient to enoxaparin 1 mg/kg q12 hr once clinically stable and no further procedures planned  [name] A 12/12/2020, 6:36 AM

## 2020-12-12 NOTE — Progress Notes (Signed)
ABG noted  RR increased to 30, ABG in AM

## 2020-12-12 DEATH — deceased

## 2020-12-13 ENCOUNTER — Inpatient Hospital Stay (HOSPITAL_COMMUNITY): Payer: Medicare Other

## 2020-12-13 DIAGNOSIS — U071 COVID-19: Secondary | ICD-10-CM | POA: Diagnosis not present

## 2020-12-13 DIAGNOSIS — J9600 Acute respiratory failure, unspecified whether with hypoxia or hypercapnia: Secondary | ICD-10-CM | POA: Diagnosis not present

## 2020-12-13 LAB — COMPREHENSIVE METABOLIC PANEL
ALT: 20 U/L (ref 0–44)
AST: 24 U/L (ref 15–41)
Albumin: 2 g/dL — ABNORMAL LOW (ref 3.5–5.0)
Alkaline Phosphatase: 84 U/L (ref 38–126)
Anion gap: 12 (ref 5–15)
BUN: 68 mg/dL — ABNORMAL HIGH (ref 8–23)
CO2: 24 mmol/L (ref 22–32)
Calcium: 9.7 mg/dL (ref 8.9–10.3)
Chloride: 109 mmol/L (ref 98–111)
Creatinine, Ser: 1.47 mg/dL — ABNORMAL HIGH (ref 0.44–1.00)
GFR, Estimated: 39 mL/min — ABNORMAL LOW (ref >60)
Glucose, Bld: 93 mg/dL (ref 70–99)
Potassium: 4 mmol/L (ref 3.5–5.1)
Sodium: 145 mmol/L (ref 135–145)
Total Bilirubin: 0.5 mg/dL (ref 0.3–1.2)
Total Protein: 6.8 g/dL (ref 6.5–8.1)

## 2020-12-13 LAB — GLUCOSE, CAPILLARY
Glucose-Capillary: 100 mg/dL — ABNORMAL HIGH (ref 70–99)
Glucose-Capillary: 105 mg/dL — ABNORMAL HIGH (ref 70–99)
Glucose-Capillary: 105 mg/dL — ABNORMAL HIGH (ref 70–99)
Glucose-Capillary: 112 mg/dL — ABNORMAL HIGH (ref 70–99)
Glucose-Capillary: 141 mg/dL — ABNORMAL HIGH (ref 70–99)
Glucose-Capillary: 190 mg/dL — ABNORMAL HIGH (ref 70–99)
Glucose-Capillary: 196 mg/dL — ABNORMAL HIGH (ref 70–99)

## 2020-12-13 LAB — CBC WITH DIFFERENTIAL/PLATELET
Abs Immature Granulocytes: 1.22 10*3/uL — ABNORMAL HIGH (ref 0.00–0.07)
Band Neutrophils: 0 %
Basophils Absolute: 0 10*3/uL (ref 0.0–0.1)
Basophils Relative: 0 %
Blasts: 0 %
Eosinophils Absolute: 0 10*3/uL (ref 0.0–0.5)
Eosinophils Relative: 0 %
HCT: 26.3 % — ABNORMAL LOW (ref 36.0–46.0)
Hemoglobin: 8.2 g/dL — ABNORMAL LOW (ref 12.0–15.0)
Lymphocytes Relative: 3 %
Lymphs Abs: 0.7 10*3/uL (ref 0.7–4.0)
MCH: 35.7 pg — ABNORMAL HIGH (ref 26.0–34.0)
MCHC: 31.2 g/dL (ref 30.0–36.0)
MCV: 114.3 fL — ABNORMAL HIGH (ref 80.0–100.0)
Metamyelocytes Relative: 3 %
Monocytes Absolute: 1 10*3/uL (ref 0.1–1.0)
Monocytes Relative: 4 %
Myelocytes: 2 %
Neutro Abs: 21.4 10*3/uL — ABNORMAL HIGH (ref 1.7–7.7)
Neutrophils Relative %: 88 %
Other: 0 %
Platelets: 297 10*3/uL (ref 150–400)
Promyelocytes Relative: 0 %
RBC: 2.3 MIL/uL — ABNORMAL LOW (ref 3.87–5.11)
RDW: 15.1 % (ref 11.5–15.5)
WBC: 24.3 10*3/uL — ABNORMAL HIGH (ref 4.0–10.5)
nRBC: 2.4 % — ABNORMAL HIGH (ref 0.0–0.2)
nRBC: 7 /100 WBC — ABNORMAL HIGH

## 2020-12-13 LAB — BLOOD GAS, ARTERIAL
Acid-base deficit: 3.1 mmol/L — ABNORMAL HIGH (ref 0.0–2.0)
Acid-base deficit: 4.3 mmol/L — ABNORMAL HIGH (ref 0.0–2.0)
Bicarbonate: 23.7 mmol/L (ref 20.0–28.0)
Bicarbonate: 22.4 mmol/L (ref 20.0–28.0)
Drawn by: 308601
FIO2: 80
FIO2: 80
MECHVT: 490 mL
MECHVT: 490 mL
O2 Saturation: 90.8 %
O2 Saturation: 96.3 %
PEEP: 10 cmH2O
PEEP: 12 cmH2O
Patient temperature: 98.6
Patient temperature: 98.6
RATE: 24 resp/min
RATE: 24 resp/min
pCO2 arterial: 56 mmHg — ABNORMAL HIGH (ref 32.0–48.0)
pCO2 arterial: 52.8 mmHg — ABNORMAL HIGH (ref 32.0–48.0)
pH, Arterial: 7.25 — ABNORMAL LOW (ref 7.350–7.450)
pH, Arterial: 7.251 — ABNORMAL LOW (ref 7.350–7.450)
pO2, Arterial: 70.6 mmHg — ABNORMAL LOW (ref 83.0–108.0)
pO2, Arterial: 100 mmHg (ref 83.0–108.0)

## 2020-12-13 LAB — MAGNESIUM: Magnesium: 2.3 mg/dL (ref 1.7–2.4)

## 2020-12-13 LAB — D-DIMER, QUANTITATIVE: D-Dimer, Quant: 2.47 ug/mL-FEU — ABNORMAL HIGH (ref 0.00–0.50)

## 2020-12-13 LAB — FERRITIN: Ferritin: 3978 ng/mL — ABNORMAL HIGH (ref 11–307)

## 2020-12-13 LAB — C-REACTIVE PROTEIN: CRP: 36.4 mg/dL — ABNORMAL HIGH (ref <1.0)

## 2020-12-13 LAB — HEPARIN LEVEL (UNFRACTIONATED): Heparin Unfractionated: 0.32 IU/mL (ref 0.30–0.70)

## 2020-12-13 LAB — TRIGLYCERIDES: Triglycerides: 517 mg/dL — ABNORMAL HIGH (ref <150)

## 2020-12-13 MED ORDER — HYDRALAZINE HCL 10 MG PO TABS
10.0000 mg | ORAL_TABLET | Freq: Four times a day (QID) | ORAL | Status: DC
  Administered 2020-12-13 – 2020-12-17 (×17): 10 mg
  Filled 2020-12-13 (×17): qty 1

## 2020-12-13 MED ORDER — LABETALOL HCL 5 MG/ML IV SOLN: 20.0000 mg | INTRAVENOUS | Status: DC | PRN

## 2020-12-13 MED ORDER — ENOXAPARIN SODIUM 80 MG/0.8ML ~~LOC~~ SOLN
80.0000 mg | Freq: Two times a day (BID) | SUBCUTANEOUS | Status: DC
  Administered 2020-12-13 – 2020-12-14 (×3): 80 mg via SUBCUTANEOUS
  Filled 2020-12-13 (×3): qty 0.8

## 2020-12-13 MED ORDER — LACTATED RINGERS IV BOLUS
500.0000 mL | Freq: Once | INTRAVENOUS | Status: AC
  Administered 2020-12-13: 500 mL via INTRAVENOUS

## 2020-12-13 MED ORDER — HYDRALAZINE HCL 10 MG PO TABS: 10.0000 mg | ORAL_TABLET | Freq: Four times a day (QID) | ORAL | Status: DC

## 2020-12-13 NOTE — Progress Notes (Signed)
NAME:  [name], MRN:  [id number], DOB:  [birth year], LOS: 14 ADMISSION DATE:  12/03/2020, CONSULTATION DATE: 12/06/2020 REFERRING MD: Dr. Allena Katz, [facility] hospitalist, CHIEF COMPLAINT: Encephalopathy, hypoxia   Brief History:  70 year old smoker admitted 12/19 with acute hypoxic respiratory failure due to COVID 19 viral pneumonia.  Has been on nonrebreather and high flow nasal cannula. Developed acute encephalopathy 12/26 and lactic acidosis requiring transfer to ICU  Empirically being treated for neurosyphilis with a positive RPR and T. pallidum positive  Past Medical History:   Past Medical History:  Diagnosis Date  . Arthritis   . Depression   . GERD (gastroesophageal reflux disease)   . Hypertension   . Thyroid disease   . Tobacco abuse     Significant Hospital Events:   12/19 hospital admission 12/23 completed remdesivir 12/26 transferred to ICU, broad-spectrum antibiotics initiated.  Baricitinib stopped, cultures sent, eventually came back as gram-positive rods in 2 samples, CT of chest positive for pulmonary emboli systemic) started 12/27-intubated, required prone positioning 12/29: FiO2 and PEEP requirements improving, tapering steroids.  Still on pressors.  Escalating sliding scale for significant hyperglycemia. Started cont PCN G infusion as RPR was reactive and titer 1:1 w/ T Pallidum Abs Positive w/ delirium decided to treat as neurosyphilis (per ID suggestion).  12/30 BC finalized w/  CORYNEBACTERIUM PSEUDODIPHTHERIAE. Asked ID to formally see for ABX suggestions. Peep?FIO2 needs as well as CXR better. Renal fxn little worse  1/2-oxygen requirement did worsen overnight  Consults:  PCCM-12/26  Procedures:  Endotracheal intubation 12/27  Significant Diagnostic Tests:  Head CT 12/26 encephalomalacia related to remote infarcts in left parietal occipital, right occipital and left temporal lobe -new compared to 2014.  CT angiogram chest 12/26 acute pulmonary  emboli within segmental branches of left lower lobe , small clot burden, no evidence of RV strain  CT angiogram chest 12/21 no evidence of pulmonary embolus, diffuse interlobular septal thickening with GGO especially basis  Micro Data:  Blood culture 12/19 1/4 staff hominis Blood cultures x2 on 12/26: CORYNEBACTERIUM PSEUDODIPHTHERIAE RPR reactive, T. pallidum positive  Antimicrobials:  Zosyn 12/26-12/30 Vancomycin 12/26-12/30 Cont PCN G infusion 12/29>>> 14-day treatment planned  Interim History / Subjective:  Remains sedated, intermittent paralytics still needed for vent dyssynchrony Afebrile  Objective   Blood pressure (!) 141/69, pulse 99, temperature 98.9 F (37.2 C), temperature source Axillary, resp. rate (!) 24, height 5\' 7"  (1.702 m), weight 83.3 kg, SpO2 93 %.    Vent Mode: PRVC FiO2 (%):  [50 %-80 %] 80 % Set Rate:  [24 bmp] 24 bmp Vt Set:  [490 mL] 490 mL PEEP:  [10 cmH20-12 cmH20] 12 cmH20 Plateau Pressure:  [24 cmH20-30 cmH20] 30 cmH20   Intake/Output Summary (Last 24 hours) at 12/13/2020 0933 Last data filed at 12/13/2020 02/10/2021 Gross per 24 hour  Intake 1383.29 ml  Output 1360 ml  Net 23.29 ml   Filed Weights   12/08/20 0400 12/10/20 0500 12/13/20 0523  Weight: 71 kg 75.5 kg 83.3 kg    Examination: General: On full vent support, comfortable HEENT: Normocephalic, atraumatic Pulmonary: Reduced air entry at the bases bilaterally Cardiac: S1-S2 appreciated Abdomen: Soft, bowel sounds appreciated Extremities: Warm and dry Sedated, intermittent paralytics Last dose of paralytic given at 12/13/2020 at 0300 hrs. for vent dyssynchrony  Resolved Hospital Problem list   Thrombocytopenia   Assessment & Plan:  Acute hypoxemic respiratory failure secondary to Covid pneumonia ARDS Pulmonary embolism Continue mechanical ventilation per ARDS protocol Target TVol 6-8cc/kgIBW Target Plateau  Pressure < 30cm H20 Target driving pressure less than 15 cm of  water Target PaO2 55-65: titrate PEEP/FiO2 per protocol As long as PaO2 to FiO2 ratio is less than 1:150 position in prone position for 16 hours a day Ventilator associated pneumonia prevention protocol  Septic shock -Off antibiotics and stable  Presumptive diagnosis of neurosyphilis -To continue penicillin for 14 days  Possible seizures when she initially came in -Empirically on Keppra  Acute kidney injury -Renal dose medications -Avoid hypotension  Chronic CVA -CT showing evidence of encephalomalacia in the frontoparietal region -Supportive care  Leukocytosis has remained stable -No new fever  Hypothyroidism -Continue Synthroid  Hyperglycemia -Continue SSI  Unfortunately does not appear to be improving Will require tracheostomy Updated daughter-remains a full code, continue doing everything with doing -Tracheostomy discussed with daughter  Best practice (evaluated daily)  Diet: Continue tube feeds Pain/Anxiety/Delirium protocol (if indicated): Fentanyl and propofol, intermittent paralytics VAP protocol (if indicated):ordered DVT prophylaxis: On Lovenox GI prophylaxis: PPI  Glucose control: SSI; increased to resistant and added TF coverage 12/29; adjusted 12/30 Mobility: Bedrest Disposition: ICU Tubes/lines: picc 12/28 RUE. Foley 12/28-critical illness, AKI  Goals of Care:  Last date of multidisciplinary goals of care discussion: Synetta Fail --> daughter called 12/14/2019. Updated still desire full scope of care.  Code Status: Full code  The patient is critically ill with multiple organ systems failure and requires high complexity decision making for assessment and support, frequent evaluation and titration of therapies, application of advanced monitoring technologies and extensive interpretation of multiple databases. Critical Care Time devoted to patient care services described in this note independent of APP/resident time (if applicable)  is 40 minutes.   Virl Diamond MD [facility] Personal pager: [phone] If unanswered, please page CCM On-call: #[phone]

## 2020-12-14 ENCOUNTER — Inpatient Hospital Stay (HOSPITAL_COMMUNITY): Payer: Medicare Other

## 2020-12-14 DIAGNOSIS — U071 COVID-19: Secondary | ICD-10-CM | POA: Diagnosis not present

## 2020-12-14 DIAGNOSIS — J9600 Acute respiratory failure, unspecified whether with hypoxia or hypercapnia: Secondary | ICD-10-CM | POA: Diagnosis not present

## 2020-12-14 LAB — BASIC METABOLIC PANEL
Anion gap: 17 — ABNORMAL HIGH (ref 5–15)
BUN: 115 mg/dL — ABNORMAL HIGH (ref 8–23)
CO2: 16 mmol/L — ABNORMAL LOW (ref 22–32)
Calcium: 8.6 mg/dL — ABNORMAL LOW (ref 8.9–10.3)
Chloride: 101 mmol/L (ref 98–111)
Creatinine, Ser: 3.44 mg/dL — ABNORMAL HIGH (ref 0.44–1.00)
GFR, Estimated: 14 mL/min — ABNORMAL LOW (ref >60)
Glucose, Bld: 329 mg/dL — ABNORMAL HIGH (ref 70–99)
Potassium: 5.4 mmol/L — ABNORMAL HIGH (ref 3.5–5.1)
Sodium: 134 mmol/L — ABNORMAL LOW (ref 135–145)

## 2020-12-14 LAB — GLUCOSE, CAPILLARY
Glucose-Capillary: 111 mg/dL — ABNORMAL HIGH (ref 70–99)
Glucose-Capillary: 134 mg/dL — ABNORMAL HIGH (ref 70–99)
Glucose-Capillary: 147 mg/dL — ABNORMAL HIGH (ref 70–99)
Glucose-Capillary: 169 mg/dL — ABNORMAL HIGH (ref 70–99)
Glucose-Capillary: 185 mg/dL — ABNORMAL HIGH (ref 70–99)
Glucose-Capillary: 229 mg/dL — ABNORMAL HIGH (ref 70–99)
Glucose-Capillary: 89 mg/dL (ref 70–99)

## 2020-12-14 LAB — CREATININE, SERUM
Creatinine, Ser: 3.1 mg/dL — ABNORMAL HIGH (ref 0.44–1.00)
GFR, Estimated: 16 mL/min — ABNORMAL LOW (ref >60)

## 2020-12-14 LAB — MAGNESIUM: Magnesium: 2.3 mg/dL (ref 1.7–2.4)

## 2020-12-14 LAB — FERRITIN: Ferritin: 3989 ng/mL — ABNORMAL HIGH (ref 11–307)

## 2020-12-14 LAB — D-DIMER, QUANTITATIVE: D-Dimer, Quant: 3.3 ug/mL-FEU — ABNORMAL HIGH (ref 0.00–0.50)

## 2020-12-14 LAB — C-REACTIVE PROTEIN: CRP: 39.6 mg/dL — ABNORMAL HIGH (ref <1.0)

## 2020-12-14 MED ORDER — INSULIN ASPART 100 UNIT/ML ~~LOC~~ SOLN
10.0000 [IU] | SUBCUTANEOUS | Status: DC
  Administered 2020-12-14 – 2020-12-18 (×25): 10 [IU] via SUBCUTANEOUS

## 2020-12-14 MED ORDER — LEVETIRACETAM 100 MG/ML PO SOLN
500.0000 mg | Freq: Two times a day (BID) | ORAL | Status: DC
  Filled 2020-12-14: qty 5

## 2020-12-14 MED ORDER — LEVETIRACETAM 100 MG/ML PO SOLN
500.0000 mg | Freq: Two times a day (BID) | ORAL | Status: DC
  Administered 2020-12-14 – 2020-12-18 (×8): 500 mg
  Filled 2020-12-14 (×10): qty 5

## 2020-12-14 MED ORDER — ENOXAPARIN SODIUM 80 MG/0.8ML ~~LOC~~ SOLN
80.0000 mg | SUBCUTANEOUS | Status: DC
  Administered 2020-12-16 – 2020-12-17 (×2): 80 mg via SUBCUTANEOUS
  Filled 2020-12-14 (×4): qty 0.8

## 2020-12-14 MED ORDER — LEVOTHYROXINE SODIUM 25 MCG PO TABS
125.0000 ug | ORAL_TABLET | Freq: Every day | ORAL | Status: DC
  Administered 2020-12-15 – 2020-12-18 (×4): 125 ug
  Filled 2020-12-14 (×4): qty 1

## 2020-12-14 MED ORDER — FUROSEMIDE 10 MG/ML IJ SOLN
120.0000 mg | Freq: Once | INTRAVENOUS | Status: AC
  Administered 2020-12-14: 120 mg via INTRAVENOUS
  Filled 2020-12-14: qty 10

## 2020-12-14 NOTE — Progress Notes (Signed)
Video call with family completed 

## 2020-12-14 NOTE — Progress Notes (Addendum)
NAME:  [name], MRN:  [id number], DOB:  09-15-1951, LOS: 15 ADMISSION DATE:  12/10/2020, CONSULTATION DATE: 12/06/2020 REFERRING MD: Dr. Allena Katz, [facility] hospitalist, CHIEF COMPLAINT: Encephalopathy, hypoxia   Brief History:  70 year old smoker admitted 12/19 with acute hypoxic respiratory failure due to COVID 19 viral pneumonia. Initially required nonrebreather and high flow nasal cannula. She developed acute encephalopathy 12/26 and lactic acidosis requiring transfer to ICU.  Empirically being treated for neurosyphilis with a positive RPR and T. pallidum positive  Past Medical History:  Arthritis  Depression  GERD HTN Thyroid Disease  Tobacco Abuse  Significant Hospital Events:  12/19 admit with hypoxic respiratory failure in setting of COVID PNA 12/23 completed remdesivir 12/26 transferred to ICU, broad-spectrum antibiotics initiated.  Baricitinib stopped, cultures sent, eventually came back as gram-positive rods in 2 samples, CT of chest positive for pulmonary emboli systemic) started 12/27 intubated, required prone positioning 12/29 FiO2 and PEEP requirements improving, tapering steroids.  Still on pressors.  Escalating sliding scale for hyperglycemia. Started cont PCN G infusion as RPR was reactive and titer 1:1 w/ T Pallidum Abs Positive w/ delirium decided to treat as neurosyphilis (per ID suggestion).  12/30 BC finalized w/  CORYNEBACTERIUM PSEUDODIPHTHERIAE. Asked ID to formally see for ABX suggestions. Peep?FIO2 needs as well as CXR better. Renal fxn little worse  1/02 oxygen requirement worsened overnight  Consults:  PCCM-12/26  Procedures:  ETT 12/27 >> RUE PICC 12/28 >>   Significant Diagnostic Tests:   Head CT 12/26 >> encephalomalacia related to remote infarcts in left parietal occipital, right occipital and left temporal lobe -new compared to 2014.  CT angiogram chest 12/26 >> acute pulmonary emboli within segmental branches of left lower lobe, small clot  burden, no evidence of RV strain  CT angiogram chest 12/21 >> no evidence of pulmonary embolus, diffuse interlobular septal thickening with GGO especially basis  Micro Data:  BCID 12/19 >> staph species detected  BCx2 12/19 >> staph hominis (aerobic bottle only) MRSA PCR 12/26 >> negative  BCx2 12/26 >> corynebacterium pseudodiphtheriae  RPR 12/26 >> reactive  T.Pallidium 12/26 >> reactive  BCx2 12/30 >>  Antimicrobials:  Zosyn 12/26 >> 12/30 Vancomycin 12/26 >> 12/30 Cont PCN G infusion 12/29 >> x14-day treatment    Interim History / Subjective:  Vent - PEEP 12 / 70% FiO2, Peak 35, Pplat 33, driving pressure 21  Glucose range 185-229  Afebrile I/O 1.2L UOP, +100 ml in last 24 hours   Objective   Blood pressure (!) 153/85, pulse 98, temperature 98.6 F (37 C), temperature source Axillary, resp. rate (!) 32, height 5\' 7"  (1.702 m), weight 83.3 kg, SpO2 94 %.    Vent Mode: PRVC FiO2 (%):  [70 %-100 %] 70 % Set Rate:  [24 bmp-32 bmp] 32 bmp Vt Set:  [490 mL] 490 mL PEEP:  [12 cmH20] 12 cmH20 Plateau Pressure:  [27 cmH20-32 cmH20] 30 cmH20   Intake/Output Summary (Last 24 hours) at 12/14/2020 1150 Last data filed at 12/14/2020 0939 Gross per 24 hour  Intake 1046.07 ml  Output 300 ml  Net 746.07 ml   Filed Weights   12/08/20 0400 12/10/20 0500 12/13/20 0523  Weight: 71 kg 75.5 kg 83.3 kg    Examination: General: critically ill appearing adult female lying in bed in NAD on vent  HEENT: MM pink/moist, ETT, anicteric, pupils 40mm Neuro: sedate  CV: s1s2 RRR, no m/r/g PULM: non-labored on vent, lungs bilaterally coarse / vent assisted breaths  GI: soft, bsx4 active  Extremities: warm/dry, 1+ generalized edema  Skin: no rashes or lesions  Resolved Hospital Problem list   Thrombocytopenia  Septic shock  Assessment & Plan:   Acute hypoxemic respiratory failure secondary to Covid PNA ARDS Pulmonary Embolism -low Vt ventilation 4-8cc/kg -goal plateau pressure <30,  driving pressure [id number] cm H2O -target PaO2 55-65, titrate PEEP/FiO2 per ARDS protocol  -if P/F ratio <150, consider prone therapy for 16 hours per day -goal CVP <4, diuresis as necessary -VAP prevention measures  -follow intermittent CXR  -solumdedrol 40 mg Q24, taper to off pending O2 response -suspect she will need tracheostomy  -continue lovenox for PE, dose adjust based on sr Cr  Presumptive diagnosis of Neurosyphilis -PCN, D6/14  -appreciate ID input   Possible Seizures Seen on admit  -continue empiric keppra   Acute Kidney Injury -repeat BMP now as sr cr doubled, ? erroneous reading  Trend BMP / urinary output -Replace electrolytes as indicated -Avoid nephrotoxic agents, ensure adequate renal perfusion -plan for diuresis once BMP returned  Hx CVA with R sided Weakness  CT showing evidence of encephalomalacia in the frontoparietal region -supportive care -PT efforts   Leukocytosis   -monitor   Anemia  -trend CBC  -transfuse for Hgb <7%  Hypothyroidism -continue synthroid   Hyperglycemia -SSI, resistant scale -increase TF coverage to 10 units Q4  Moderate Protein Calorie Malnutrition -TF per Nutrition   Best practice (evaluated daily)  Diet: TF Pain/Anxiety/Delirium protocol (if indicated): Fentanyl and propofol, intermittent paralytics VAP protocol (if indicated):ordered DVT prophylaxis: Lovenox GI prophylaxis: PPI  Glucose control: SSI Mobility: Bedrest Disposition: ICU  Goals of Care:  Last date of multidisciplinary goals of care discussion: Rodena Piety, daughter, called 1/3 for update.  Spoke with patient's niece as well Charise Carwin, she is an Therapist, sports at [employer]).  Reviewed questions and answered for daughter / family. Asked them to discuss the concept of tracheostomy.  Reviewed the difficulties regarding multiple organ failure (if we get to that point).  Will follow up.  Time spent on phone with family: 29 minutes.  Code Status: Full code      Critical Care  Time: 34 minutes  Noe Gens, MSN, NP-C, AGACNP-BC [facility] 12/14/2020, 11:51 AM   Please see [email or web address] for pager details.

## 2020-12-14 NOTE — Progress Notes (Addendum)
ID Pharmacy Note  Spoke with  Syphilis Hotline this morning.   Ms. Brose only had one previous RPR that was 1:1 on Apr 13, 2020. There was no recorded treatment history for Ms. Saetern.    Can continue Pen G for neurosyphilis X 14 days per ID recs. Would recommend considering a one time dose of Pen G benzathine 2.4 million units IM at the end of the 14 days to also complete therapy for late latent syphilis.    Sharin Mons, PharmD, BCPS, BCIDP Infectious Diseases Clinical Pharmacist Phone: [phone] 12/14/2020 9:24 AM

## 2020-12-15 ENCOUNTER — Inpatient Hospital Stay (HOSPITAL_COMMUNITY): Payer: Medicare Other

## 2020-12-15 DIAGNOSIS — U071 COVID-19: Secondary | ICD-10-CM | POA: Diagnosis not present

## 2020-12-15 DIAGNOSIS — J9600 Acute respiratory failure, unspecified whether with hypoxia or hypercapnia: Secondary | ICD-10-CM | POA: Diagnosis not present

## 2020-12-15 LAB — CBC
HCT: 21.7 % — ABNORMAL LOW (ref 36.0–46.0)
Hemoglobin: 6.9 g/dL — CL (ref 12.0–15.0)
MCH: 36.3 pg — ABNORMAL HIGH (ref 26.0–34.0)
MCHC: 31.8 g/dL (ref 30.0–36.0)
MCV: 114.2 fL — ABNORMAL HIGH (ref 80.0–100.0)
Platelets: 227 10*3/uL (ref 150–400)
RBC: 1.9 MIL/uL — ABNORMAL LOW (ref 3.87–5.11)
RDW: 16.4 % — ABNORMAL HIGH (ref 11.5–15.5)
WBC: 21.8 10*3/uL — ABNORMAL HIGH (ref 4.0–10.5)
nRBC: 4.1 % — ABNORMAL HIGH (ref 0.0–0.2)

## 2020-12-15 LAB — BLOOD GAS, ARTERIAL
Acid-base deficit: 10.6 mmol/L — ABNORMAL HIGH (ref 0.0–2.0)
Bicarbonate: 17.6 mmol/L — ABNORMAL LOW (ref 20.0–28.0)
Drawn by: 22563
FIO2: 80
MECHVT: 490 mL
O2 Saturation: 90.9 %
PEEP: 12 cmH2O
Patient temperature: 98.1
RATE: 32 resp/min
pCO2 arterial: 52.9 mmHg — ABNORMAL HIGH (ref 32.0–48.0)
pH, Arterial: 7.146 — CL (ref 7.350–7.450)
pO2, Arterial: 74 mmHg — ABNORMAL LOW (ref 83.0–108.0)

## 2020-12-15 LAB — COMPREHENSIVE METABOLIC PANEL
ALT: 14 U/L (ref 0–44)
AST: 21 U/L (ref 15–41)
Albumin: 1.7 g/dL — ABNORMAL LOW (ref 3.5–5.0)
Alkaline Phosphatase: 91 U/L (ref 38–126)
Anion gap: 18 — ABNORMAL HIGH (ref 5–15)
BUN: 108 mg/dL — ABNORMAL HIGH (ref 8–23)
CO2: 17 mmol/L — ABNORMAL LOW (ref 22–32)
Calcium: 9.6 mg/dL (ref 8.9–10.3)
Chloride: 102 mmol/L (ref 98–111)
Creatinine, Ser: 3.98 mg/dL — ABNORMAL HIGH (ref 0.44–1.00)
GFR, Estimated: 12 mL/min — ABNORMAL LOW (ref >60)
Glucose, Bld: 177 mg/dL — ABNORMAL HIGH (ref 70–99)
Potassium: 5.3 mmol/L — ABNORMAL HIGH (ref 3.5–5.1)
Sodium: 137 mmol/L (ref 135–145)
Total Bilirubin: 0.9 mg/dL (ref 0.3–1.2)
Total Protein: 6.8 g/dL (ref 6.5–8.1)

## 2020-12-15 LAB — CULTURE, BLOOD (ROUTINE X 2)
Culture: NO GROWTH
Culture: NO GROWTH
Special Requests: ADEQUATE
Special Requests: ADEQUATE

## 2020-12-15 LAB — URINALYSIS, ROUTINE W REFLEX MICROSCOPIC
Bilirubin Urine: NEGATIVE
Glucose, UA: NEGATIVE mg/dL
Ketones, ur: NEGATIVE mg/dL
Nitrite: NEGATIVE
Protein, ur: NEGATIVE mg/dL
Specific Gravity, Urine: 1.01 (ref 1.005–1.030)
pH: 6 (ref 5.0–8.0)

## 2020-12-15 LAB — GLUCOSE, CAPILLARY
Glucose-Capillary: 148 mg/dL — ABNORMAL HIGH (ref 70–99)
Glucose-Capillary: 160 mg/dL — ABNORMAL HIGH (ref 70–99)
Glucose-Capillary: 165 mg/dL — ABNORMAL HIGH (ref 70–99)
Glucose-Capillary: 173 mg/dL — ABNORMAL HIGH (ref 70–99)
Glucose-Capillary: 173 mg/dL — ABNORMAL HIGH (ref 70–99)
Glucose-Capillary: 179 mg/dL — ABNORMAL HIGH (ref 70–99)

## 2020-12-15 LAB — FERRITIN: Ferritin: 4118 ng/mL — ABNORMAL HIGH (ref 11–307)

## 2020-12-15 LAB — URINALYSIS, MICROSCOPIC (REFLEX): Squamous Epithelial / HPF: NONE SEEN (ref 0–5)

## 2020-12-15 LAB — SODIUM, URINE, RANDOM: Sodium, Ur: 64 mmol/L

## 2020-12-15 LAB — HEMOGLOBIN AND HEMATOCRIT, BLOOD
HCT: 25.6 % — ABNORMAL LOW (ref 36.0–46.0)
Hemoglobin: 8.2 g/dL — ABNORMAL LOW (ref 12.0–15.0)

## 2020-12-15 LAB — C-REACTIVE PROTEIN: CRP: 48.3 mg/dL — ABNORMAL HIGH (ref <1.0)

## 2020-12-15 LAB — PREPARE RBC (CROSSMATCH)

## 2020-12-15 LAB — D-DIMER, QUANTITATIVE: D-Dimer, Quant: 4.74 ug/mL-FEU — ABNORMAL HIGH (ref 0.00–0.50)

## 2020-12-15 LAB — ABO/RH: ABO/RH(D): O POS

## 2020-12-15 LAB — MAGNESIUM: Magnesium: 2.5 mg/dL — ABNORMAL HIGH (ref 1.7–2.4)

## 2020-12-15 LAB — OCCULT BLOOD X 1 CARD TO LAB, STOOL: Fecal Occult Bld: POSITIVE — AB

## 2020-12-15 MED ORDER — FENTANYL BOLUS VIA INFUSION
200.0000 ug | INTRAVENOUS | Status: DC | PRN
  Administered 2020-12-15 – 2020-12-18 (×2): 200 ug via INTRAVENOUS
  Administered 2020-12-18: 100 ug via INTRAVENOUS
  Filled 2020-12-15: qty 200

## 2020-12-15 MED ORDER — MIDAZOLAM HCL 2 MG/2ML IJ SOLN
2.0000 mg | INTRAMUSCULAR | Status: DC | PRN
  Administered 2020-12-18: 2 mg via INTRAVENOUS

## 2020-12-15 MED ORDER — MIDAZOLAM 50MG/50ML (1MG/ML) PREMIX INFUSION
0.5000 mg/h | INTRAVENOUS | Status: DC
  Administered 2020-12-15: 0.5 mg/h via INTRAVENOUS
  Administered 2020-12-15: 2 mg/h via INTRAVENOUS
  Administered 2020-12-16: 5 mg/h via INTRAVENOUS
  Administered 2020-12-16 – 2020-12-17 (×2): 4 mg/h via INTRAVENOUS
  Filled 2020-12-15 (×6): qty 50

## 2020-12-15 MED ORDER — SODIUM CHLORIDE 0.9% IV SOLUTION: Freq: Once | INTRAVENOUS | Status: DC

## 2020-12-15 MED ORDER — CHLORHEXIDINE GLUCONATE 0.12 % MT SOLN
OROMUCOSAL | Status: AC
  Administered 2020-12-15: 15 mL via OROMUCOSAL
  Filled 2020-12-15: qty 15

## 2020-12-15 MED ORDER — VITAL 1.5 CAL PO LIQD
1000.0000 mL | ORAL | Status: DC
  Administered 2020-12-16: 1000 mL
  Filled 2020-12-15 (×5): qty 1000

## 2020-12-15 MED ORDER — SODIUM ZIRCONIUM CYCLOSILICATE 10 G PO PACK
10.0000 g | PACK | Freq: Once | ORAL | Status: AC
  Administered 2020-12-15: 10 g via ORAL
  Filled 2020-12-15: qty 1

## 2020-12-15 MED ORDER — SODIUM CHLORIDE 0.9% IV SOLUTION: Freq: Once | INTRAVENOUS | Status: AC

## 2020-12-15 MED ORDER — FUROSEMIDE 10 MG/ML IJ SOLN
120.0000 mg | Freq: Once | INTRAVENOUS | Status: AC
  Administered 2020-12-15: 120 mg via INTRAVENOUS
  Filled 2020-12-15: qty 10

## 2020-12-15 NOTE — Progress Notes (Addendum)
NAME:  [name], MRN:  [id number], DOB:  07-11-1951, LOS: 16 ADMISSION DATE:  2020/12/28, CONSULTATION DATE: 12/06/2020 REFERRING MD: Dr. Allena Katz, [facility] hospitalist, CHIEF COMPLAINT: Encephalopathy, hypoxia   Brief History:  70 year old smoker admitted 12/19 with acute hypoxic respiratory failure due to COVID 19 viral pneumonia. Initially required nonrebreather and high flow nasal cannula. She developed acute encephalopathy 12/26 and lactic acidosis requiring transfer to ICU.  Empirically being treated for neurosyphilis with a positive RPR and T. pallidum positive  Past Medical History:  Arthritis  Depression  GERD HTN Thyroid Disease  Tobacco Abuse  Significant Hospital Events:  12/19 admit with hypoxic respiratory failure in setting of COVID PNA 12/23 completed remdesivir 12/26 transferred to ICU, broad-spectrum antibiotics initiated.  Baricitinib stopped, cultures sent, eventually came back as gram-positive rods in 2 samples, CT of chest positive for pulmonary emboli systemic) started 12/27 intubated, required prone positioning 12/29 FiO2 and PEEP requirements improving, tapering steroids.  Still on pressors.  Escalating sliding scale for hyperglycemia. Started cont PCN G infusion as RPR was reactive and titer 1:1 w/ T Pallidum Abs Positive w/ delirium decided to treat as neurosyphilis (per ID suggestion).  12/30 BC finalized w/  CORYNEBACTERIUM PSEUDODIPHTHERIAE. Asked ID to formally see for ABX suggestions. Peep?FIO2 needs as well as CXR better. Renal fxn little worse  1/02 oxygen requirement worsened overnight 1/03 FiO2 70%, PEEP 12, Peak 35, Pplat 33, driving pressure 21    Consults:  PCCM-12/26  Procedures:  ETT 12/27 >> RUE PICC 12/28 >>   Significant Diagnostic Tests:   Head CT 12/26 >> encephalomalacia related to remote infarcts in left parietal occipital, right occipital and left temporal lobe -new compared to 2014.  CT angiogram chest 12/26 >> acute  pulmonary emboli within segmental branches of left lower lobe, small clot burden, no evidence of RV strain  CT angiogram chest 12/21 >> no evidence of pulmonary embolus, diffuse interlobular septal thickening with GGO especially basis  Micro Data:  BCID 12/19 >> staph species detected  BCx2 12/19 >> staph hominis (aerobic bottle only) MRSA PCR 12/26 >> negative  BCx2 12/26 >> corynebacterium pseudodiphtheriae  RPR 12/26 >> reactive  T.Pallidium 12/26 >> reactive  BCx2 12/30 >> negative   Antimicrobials:  Zosyn 12/26 >> 12/30 Vancomycin 12/26 >> 12/30 Cont PCN G infusion 12/29 >> x14-day treatment    Interim History / Subjective:  Afebrile FiO2 increased to 80% / PEEP 12, Peak 37, Pplat 34, driving pressure 22 Glucose range 160-173 I/O 2.4L UOP, -1.1L in last 24 hours   Objective   Blood pressure (!) 142/83, pulse (!) 102, temperature 98 F (36.7 C), temperature source Axillary, resp. rate (!) 21, height 5\' 7"  (1.702 m), weight 87.3 kg, SpO2 (!) 88 %.    Vent Mode: PRVC FiO2 (%):  [70 %-90 %] 80 % Set Rate:  [32 bmp] 32 bmp Vt Set:  [490 mL] 490 mL PEEP:  [12 cmH20] 12 cmH20 Plateau Pressure:  [26 cmH20-33 cmH20] 33 cmH20   Intake/Output Summary (Last 24 hours) at 12/15/2020 0826 Last data filed at 12/15/2020 0753 Gross per 24 hour  Intake 1379.07 ml  Output 2400 ml  Net -1020.93 ml   Filed Weights   12/10/20 0500 12/13/20 0523 12/15/20 0412  Weight: 75.5 kg 83.3 kg 87.3 kg    Examination: General: critically ill appearing adult female lying in bed on vent HEENT: MM pink/moist, ETT, mild scleral edema on right Neuro: sedate CV: s1s2 rrr, no m/r/g PULM: non-labored on vent, synchronous,  vent assisted breaths  GI: soft, bsx4 active  Extremities: warm/dry, 1+ but improved edema  Skin: no rashes or lesions   Resolved Hospital Problem list   Thrombocytopenia  Septic shock  Assessment & Plan:   Acute hypoxemic respiratory failure secondary to Covid  PNA ARDS Pulmonary Embolism -low Vt ventilation 4-8cc/kg -goal plateau pressure <30, driving pressure [id number] cm H2O -target PaO2 55-65, titrate PEEP/FiO2 per ARDS protocol  -if P/F ratio <150, consider prone therapy for 16 hours per day -goal CVP <4, diuresis as necessary / must balance with renal injury -VAP prevention measures  -follow intermittent CXR  -solumdedrol 40 mg Q24, taper to off pending O2 response -lovenox for PE, dose adjust for Cr  Sedation Needs while on Mechanical Ventilation  -PAD protocol for RASS goal of -2 to -3 with vent synchrony  -PRN NMB if needed for synchrony  -continue versed, fentanyl gtt  Presumptive diagnosis of Neurosyphilis -PCN, completed 6 days of 14. Held on 1/3 with concern for AIN -appreciate ID   Possible Seizures Seen on admit  -continue empiric keppra  Acute Kidney Injury Hyperkalemia -lokelma 10 mg x1  -Nephrology consulted, appreciate input  -had good response to lasix 1/4, volume overloaded > will repeat 1/4 and reassess 1/5 -Trend BMP / urinary output -Replace electrolytes as indicated -Avoid nephrotoxic agents, ensure adequate renal perfusion  Hx CVA with R sided Weakness  CT showing evidence of encephalomalacia in the frontoparietal region -supportive care  -PT efforts when able   Leukocytosis   -monitor   Anemia  FOBT positive, transfused 1/4  -trend CBC  -transfuse for Hgb <7% or evidence of active bleeding  -transfuse 1 unit PRBC 1/4  -may need GI evaluation at some point   Hypothyroidism -synthroid   Hyperglycemia -SSI, resistant scale  -TF coverage 10 units Q4   Moderate Protein Calorie Malnutrition -TF per Nutrition   Best practice (evaluated daily)  Diet: TF Pain/Anxiety/Delirium protocol (if indicated): Fentanyl and propofol, intermittent paralytics VAP protocol (if indicated):ordered DVT prophylaxis: Lovenox GI prophylaxis: PPI  Glucose control: SSI Mobility: Bedrest Disposition: ICU  Goals of  Care:  Last date of multidisciplinary goals of care discussion: Rodena Piety, daughter, called 1/3 for update.  Spoke with patient's niece as well Charise Carwin, she is an Therapist, sports at [employer]).  Will call for update 1/4.  Code Status: Full code      Critical Care Time: 78 minutes  Noe Gens, MSN, NP-C, AGACNP-BC [facility] 12/15/2020, 8:26 AM   Please see [email or web address] for pager details.

## 2020-12-15 NOTE — Progress Notes (Signed)
Assisted tele visit to patient with family member.  Cameron D Howard, RN   

## 2020-12-15 NOTE — Progress Notes (Addendum)
Patient's daughter Synetta Fail and niece Feliz Beam updated on patients status and plan of care.  Reviewed renal issues, hyperkalemia, worsening acidosis, high ventilator support needs, sedation change and anemia. Reviewed "study" regarding NAC for COVID that they mentioned yesterday with Dr. Judeth Horn - discussed levels of evidence with family and that this is a case report.  Thus, not a standard of care.  They indicate understanding.  They are aware of Nephrology consult. Will follow up with family 1/5. Approximately 15 minutes spent with family on phone.    Canary Brim, MSN, NP-C, AGACNP-BC [facility] 12/15/2020, 2:09 PM   Please see [email or web address] for pager details.

## 2020-12-15 NOTE — Progress Notes (Addendum)
CRITICAL VALUE ALERT  Critical Value:  hgb 6.9  Date & Time Notied:  1/4 @ 0500  Provider Notified: Elink informed  Orders Received/Actions taken: awaiting new orders  transfuse one unit

## 2020-12-15 NOTE — Progress Notes (Signed)
eLink Physician-Brief Progress Note Patient Name: [name] DOB: May 18, 1951 MRN: [id number]   Date of Service  12/15/2020  HPI/Events of Note  Hemoglobin 6.9 gm / dl, Triglyceride level 324, patient needs ABG.  eICU Interventions  I unit PRBC ordered,  I obtained informed consent from her daughter Georgia Lopes, Propofol discontinued and Versed substituted, ABG ordered.        Thomasene Lot Alishea Beaudin 12/15/2020, 5:27 AM

## 2020-12-15 NOTE — Progress Notes (Signed)
CRITICAL VALUE ALERT  Critical Value:  PH 7.146  Date & Time Notied:  1/4 @0628    Provider Notified: elink informed  Orders Received/Actions taken: awaiting new orders
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
## 2020-12-15 NOTE — Consult Note (Addendum)
[name] Admit Date: 11/28/2020 12/15/2020 [name] Requesting Physician:  Hunsucker  Reason for Consult:  AKI HPI:  79F PMH including, hypertension, GERD, depression, history of CVA tobacco use, OA who was admitted on 12/19 to [facility] long hospital with fever, vomiting and recent diagnosis of COVID-19 pneumonia.  She had progressive hypoxia and has progressed to ventilator dependence (12/27) with severe hypoxic respiratory failure despite directed therapies for COVID-19.  Further, she has been diagnosed with pulmonary embolism and is on anticoagulation.  In addition, she had a positive RPR with a one-to-one titer and a positive TPA, and there was concern for neurosyphilis and she started on penicillin.  Patient with contrast exposure for CTA on 12/26.  No exposure to NSAIDs, ACE inhibitor's, ARB use.  Trend in serum creatinine is below.  Renal failure has dramatically progressed over the past 48 hours.  Renal ultrasound yesterday demonstrated that the Foley catheter was in the bladder about the bladder was distended suggesting catheter malfunction.  There was some mild fullness on the right collecting system.  She has received bolus furosemide each to the past 2 days with a 2.4 L urine output yesterday and at least 1.2 L thus far today.  Urinalysis today with 6-10 leukocytes per high-powered field, 6-10 erythrocytes per high-powered field, negative protein.  Labs today with potassium of 5.3, HCO3 17 with ABG pH 7.14 and PCO2 of 53.  Hemoglobin was 6.9 and patient has been transfused.  No thrombocytopenia.  No significant hypotension.  Last CBC with differential 1/2 without eosinophilia.  Patient remains on Solu-Medrol.    Creatinine, Ser (mg/dL)  Date Value  12/15/2020 3.98 (H)  12/14/2020 3.44 (H)  12/14/2020 3.10 (H)  12/13/2020 1.47 (H)  12/12/2020 1.48 (H)  12/11/2020 1.52 (H)  12/10/2020 1.65 (H)  12/10/2020 1.76 (H)  12/09/2020 1.52 (H)  12/08/2020 1.36 (H)   ] I/Os: I/O last 3 completed shifts: In: 1923.7 [I.V.:1462.1; Other:200; IV Piggyback:261.7] Out: 2700 [Urine:2700]   ROS NSAIDS: No identified exposure your IV Contrast no recent exposure TMP/SMX no identified exposure Hypotension not present Balance of 12 systems is negative w/ exceptions as above  PMH  Past Medical History:  Diagnosis Date  . Arthritis   . Depression   . GERD (gastroesophageal reflux disease)   . Hypertension   . Thyroid disease   . Tobacco abuse    PSH  Past Surgical History:  Procedure Laterality Date  . CESAREAN SECTION    . EYE SURGERY     FH  Family History  Problem Relation Age of Onset  . Breast cancer Mother 2  . Esophageal cancer Brother 68  . Esophageal cancer Father   . Throat cancer Brother   . Healthy Daughter   . Colon cancer Neg Hx    SH  reports that she has been smoking cigarettes. She has a 20.00 pack-year smoking history. She has never used smokeless tobacco. She reports current alcohol use. She reports current drug use. Drug: Marijuana. Allergies No Known Allergies Home medications Prior to Admission medications   Medication Sig Start Date End Date Taking? Authorizing Provider  cetirizine (ZYRTEC ALLERGY) 10 MG tablet Take 1 tablet (10 mg total) by mouth daily. 11/24/20  Yes [name], [place], PA-C  Cyanocobalamin (VITAMIN B 12 PO) Take 1 tablet by mouth daily.   Yes [provider]  famotidine (PEPCID) 20 MG tablet One at bedtime Patient taking differently: Take 20 mg by mouth at bedtime. 08/28/15  Yes [name] Rockers, MD  fluticasone (FLONASE) 50 MCG/ACT nasal spray Place 1 spray into both nostrils daily. 11/24/20  Yes [name], [place], PA-C  Fluticasone-Salmeterol (ADVAIR) 250-50 MCG/DOSE AEPB Inhale 1 puff into the lungs 2 (two) times daily.   Yes [provider]  gabapentin (NEURONTIN) 300 MG capsule Take 300 mg by mouth 3 (three) times daily.   Yes [provider]   hydrochlorothiazide (HYDRODIURIL) 25 MG tablet Take 25 mg by mouth daily.   Yes [provider]  ibuprofen (ADVIL,MOTRIN) 800 MG tablet Take 1 tablet (800 mg total) by mouth every 8 (eight) hours as needed. Patient taking differently: Take 800 mg by mouth every 8 (eight) hours as needed for fever, headache, mild pain, moderate pain or cramping. 01/26/15  Yes [name], [name] Gave, PA-C  levothyroxine (SYNTHROID, LEVOTHROID) 125 MCG tablet Take 125 mcg by mouth daily before breakfast.   Yes [provider]  irbesartan (AVAPRO) 75 MG tablet TAKE ONE TABLET BY MOUTH ONCE DAILY Patient not taking: No sig reported 12/30/15   [name] Rockers, MD  pregabalin (LYRICA) 75 MG capsule Take by mouth. Patient not taking: No sig reported 10/26/20   [provider]    Current Medications Scheduled Meds: . sodium chloride   Intravenous Once  . artificial tears  1 application Both Eyes O7H  . chlorhexidine gluconate (MEDLINE KIT)  15 mL Mouth Rinse BID  . Chlorhexidine Gluconate Cloth  6 each Topical Q0600  . enoxaparin (LOVENOX) injection  80 mg Subcutaneous Q24H  . feeding supplement (PROSource TF)  90 mL Per Tube BID  . folic acid  1 mg Per Tube Daily  . hydrALAZINE  10 mg Per Tube Q6H  . insulin aspart  0-20 Units Subcutaneous Q4H  . insulin aspart  10 Units Subcutaneous Q4H  . levETIRAcetam  500 mg Per Tube BID  . levothyroxine  125 mcg Per Tube Q0600  . mouth rinse  15 mL Mouth Rinse 10 times per day  . methylPREDNISolone (SOLU-MEDROL) injection  40 mg Intravenous Q24H  . multivitamin  15 mL Per Tube Daily  . pantoprazole sodium  40 mg Per Tube Q1200  . sodium chloride flush  10-40 mL Intracatheter Q12H  . thiamine  100 mg Per Tube Daily   Or  . thiamine  100 mg Intravenous Daily   Continuous Infusions: . sodium chloride Stopped (12/08/20 0436)  . feeding supplement (VITAL 1.5 CAL)    . fentaNYL infusion INTRAVENOUS 275 mcg/hr (12/15/20 1452)  . midazolam 2 mg/hr  (12/15/20 1452)   PRN Meds:.acetaminophen (TYLENOL) oral liquid 160 mg/5 mL, artificial tears, atropine, fentaNYL, labetalol, lip balm, midazolam, [DISCONTINUED] ondansetron **OR** ondansetron (ZOFRAN) IV, sodium chloride flush, vecuronium  CBC Recent Labs  Lab 12/11/20 0510 12/12/20 0500 12/13/20 0500 12/15/20 0351 12/15/20 1438  WBC 25.8* 26.5* 24.3* 21.8*  --   NEUTROABS 21.5* 21.9* 21.4*  --   --   HGB 7.7* 8.1* 8.2* 6.9* 8.2*  HCT 24.8* 25.9* 26.3* 21.7* 25.6*  MCV 110.7* 112.1* 114.3* 114.2*  --   PLT 213 239 297 227  --    Basic Metabolic Panel Recent Labs  Lab 12/09/20 0451 12/09/20 1813 12/10/20 0424 12/10/20 0435 12/10/20 1701 12/11/20 0510 12/12/20 0500 12/13/20 0500 12/14/20 0500 12/14/20 1400 12/15/20 0351  NA 137  --   --  141  --  144 142 145  --  134* 137  K 4.2  --   --  3.8  --  3.6 3.6 4.0  --  5.4* 5.3*  CL  107  --   --  111  --  112* 109 109  --  101 102  CO2 19*  --   --  22  --  23 22 24   --  16* 17*  GLUCOSE 377*  --   --  209*  --  148* 157* 93  --  329* 177*  BUN 36*  --   --  50*  --  57* 58* 68*  --  115* 108*  CREATININE 1.52*  --  1.76* 1.65*  --  1.52* 1.48* 1.47* 3.10* 3.44* 3.98*  CALCIUM 7.9*  --   --  8.5*  --  8.9 9.2 9.7  --  8.6* 9.6  PHOS 3.9 3.8 3.4  --  3.6  --   --   --   --   --   --     Physical Exam  Blood pressure (!) 141/81, pulse 90, temperature 98 F (36.7 C), temperature source Axillary, resp. rate 19, height 5' 7"  (1.702 m), weight 87.3 kg, SpO2 (!) 89 %. 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 AKI, nonoligiuric: Most likely  this is ATN; AIN seems less likely.  1. Given robust UOP and stable labs, no immediate indication to initiate RRT 2. Can continue to use furosemide as needed 3. Renal ultrasound with some suggestion of catheter malfunction 2. Severe COVID-19 pneumonia/ARDS/hypoxic ventilator dependent respiratory failure, per CCM 3. Anemia, severe, transfused today 4. Pulmonary emboli, acute, on anticoagulation, per CCM 5. Question of neurosyphilis, weakly positive RPR and positive TPA had started on penicillin but held because of question of AIN; I do not think AIN is most likely here  Plan 1. Continue supportive care 2. We will follow along closely, evaluate daily for dialysis needs 3. Hopefully will turn the corner in time, UOP is reassuring 4. CCM to d/w RN to make sure the Foley flushes properly 5. No strong objection to resuming penicillin if needed for treatment of #5 6. Daily weights, Daily Renal Panel, Strict I/Os, Avoid nephrotoxins (NSAIDs, judicious IV Contrast)    [name]  12/15/2020, 4:00 PM

## 2020-12-16 ENCOUNTER — Inpatient Hospital Stay (HOSPITAL_COMMUNITY): Payer: Medicare Other

## 2020-12-16 DIAGNOSIS — U071 COVID-19: Secondary | ICD-10-CM | POA: Diagnosis not present

## 2020-12-16 DIAGNOSIS — J80 Acute respiratory distress syndrome: Secondary | ICD-10-CM | POA: Diagnosis not present

## 2020-12-16 DIAGNOSIS — J9600 Acute respiratory failure, unspecified whether with hypoxia or hypercapnia: Secondary | ICD-10-CM | POA: Diagnosis not present

## 2020-12-16 LAB — C-REACTIVE PROTEIN: CRP: 46 mg/dL — ABNORMAL HIGH (ref <1.0)

## 2020-12-16 LAB — BLOOD GAS, ARTERIAL
Acid-base deficit: 8.6 mmol/L — ABNORMAL HIGH (ref 0.0–2.0)
Bicarbonate: 19.5 mmol/L — ABNORMAL LOW (ref 20.0–28.0)
FIO2: 100
O2 Saturation: 85.6 %
Patient temperature: 97.5
pCO2 arterial: 54.9 mmHg — ABNORMAL HIGH (ref 32.0–48.0)
pH, Arterial: 7.171 — CL (ref 7.350–7.450)
pO2, Arterial: 59.4 mmHg — ABNORMAL LOW (ref 83.0–108.0)

## 2020-12-16 LAB — BASIC METABOLIC PANEL
Anion gap: 20 — ABNORMAL HIGH (ref 5–15)
BUN: 157 mg/dL — ABNORMAL HIGH (ref 8–23)
CO2: 19 mmol/L — ABNORMAL LOW (ref 22–32)
Calcium: 9.6 mg/dL (ref 8.9–10.3)
Chloride: 105 mmol/L (ref 98–111)
Creatinine, Ser: 3.55 mg/dL — ABNORMAL HIGH (ref 0.44–1.00)
GFR, Estimated: 13 mL/min — ABNORMAL LOW (ref >60)
Glucose, Bld: 196 mg/dL — ABNORMAL HIGH (ref 70–99)
Potassium: 5.3 mmol/L — ABNORMAL HIGH (ref 3.5–5.1)
Sodium: 144 mmol/L (ref 135–145)

## 2020-12-16 LAB — GLUCOSE, CAPILLARY
Glucose-Capillary: 192 mg/dL — ABNORMAL HIGH (ref 70–99)
Glucose-Capillary: 204 mg/dL — ABNORMAL HIGH (ref 70–99)
Glucose-Capillary: 251 mg/dL — ABNORMAL HIGH (ref 70–99)
Glucose-Capillary: 258 mg/dL — ABNORMAL HIGH (ref 70–99)
Glucose-Capillary: 281 mg/dL — ABNORMAL HIGH (ref 70–99)
Glucose-Capillary: 295 mg/dL — ABNORMAL HIGH (ref 70–99)

## 2020-12-16 LAB — CBC
HCT: 25.8 % — ABNORMAL LOW (ref 36.0–46.0)
Hemoglobin: 8.1 g/dL — ABNORMAL LOW (ref 12.0–15.0)
MCH: 33.3 pg (ref 26.0–34.0)
MCHC: 31.4 g/dL (ref 30.0–36.0)
MCV: 106.2 fL — ABNORMAL HIGH (ref 80.0–100.0)
Platelets: 192 10*3/uL (ref 150–400)
RBC: 2.43 MIL/uL — ABNORMAL LOW (ref 3.87–5.11)
RDW: 19.3 % — ABNORMAL HIGH (ref 11.5–15.5)
WBC: 20.2 10*3/uL — ABNORMAL HIGH (ref 4.0–10.5)
nRBC: 5.1 % — ABNORMAL HIGH (ref 0.0–0.2)

## 2020-12-16 LAB — FERRITIN: Ferritin: 4567 ng/mL — ABNORMAL HIGH (ref 11–307)

## 2020-12-16 LAB — CALCIUM / CREATININE RATIO, URINE
Calcium, Ur: 2.6 mg/dL
Calcium/Creat.Ratio: 128 mg/g creat (ref 29–442)
Creatinine, Urine: 20.3 mg/dL

## 2020-12-16 LAB — MAGNESIUM: Magnesium: 2.4 mg/dL (ref 1.7–2.4)

## 2020-12-16 LAB — D-DIMER, QUANTITATIVE: D-Dimer, Quant: 5.57 ug/mL-FEU — ABNORMAL HIGH (ref 0.00–0.50)

## 2020-12-16 MED ORDER — INSULIN GLARGINE 100 UNIT/ML ~~LOC~~ SOLN
5.0000 [IU] | Freq: Two times a day (BID) | SUBCUTANEOUS | Status: DC
  Administered 2020-12-16 – 2020-12-18 (×4): 5 [IU] via SUBCUTANEOUS
  Filled 2020-12-16 (×5): qty 0.05

## 2020-12-16 MED ORDER — PENICILLIN G POTASSIUM 20000000 UNITS IJ SOLR
9.0000 10*6.[IU] | Freq: Two times a day (BID) | INTRAVENOUS | Status: DC
  Administered 2020-12-16 – 2020-12-17 (×3): 9 10*6.[IU] via INTRAVENOUS
  Filled 2020-12-16 (×4): qty 9

## 2020-12-16 MED ORDER — SODIUM POLYSTYRENE SULFONATE 15 GM/60ML PO SUSP
30.0000 g | Freq: Once | ORAL | Status: AC
  Administered 2020-12-16: 30 g
  Filled 2020-12-16: qty 120

## 2020-12-16 NOTE — Progress Notes (Signed)
eLink Physician-Brief Progress Note Patient Name: [name] DOB: 25-Sep-1951 MRN: [id number]   Date of Service  12/16/2020  HPI/Events of Note  I called patient's daughter Ms. Georgia Lopes to discuss her mothers deteriorating clinical status, and to review goals of care. After updating her Ms. Lars Pinks decided to make her mother a DNR. She however wants to continue aggressive treatment.  eICU Interventions  A DNR order was placed in the chart.        Kwabena Strutz U Nyara Capell 12/16/2020, 5:00 AM

## 2020-12-16 NOTE — Progress Notes (Signed)
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 Ongoing resp decline, now 100% FIO2 . > 6L UOP, SCr downtrending, BUN up on enteral nutrtion, solumedrol   01/04 0701 - 01/05 0700 In: 5682.2 [I.V.:842.2; Blood:442.5; NG/GT:4335.5; IV Piggyback:62] Out: 3151 [Urine:6300]  Filed Weights   12/13/20 0523 12/15/20 0412 12/16/20 0448  Weight: 83.3 kg 87.3 kg 82.1 kg    Scheduled Meds: . artificial tears  1 application Both Eyes V6H  . chlorhexidine gluconate (MEDLINE KIT)  15 mL Mouth Rinse BID  . Chlorhexidine Gluconate Cloth  6 each Topical Q0600  . enoxaparin (LOVENOX) injection  80 mg Subcutaneous Q24H  . feeding supplement (PROSource TF)  90 mL Per Tube BID  . folic acid  1 mg Per Tube Daily  . hydrALAZINE  10 mg Per Tube Q6H  . insulin aspart  0-20 Units Subcutaneous Q4H  . insulin aspart  10 Units Subcutaneous Q4H  . levETIRAcetam  500 mg Per Tube BID  . levothyroxine  125 mcg Per Tube Q0600  . mouth rinse  15 mL Mouth Rinse 10 times per day  . methylPREDNISolone (SOLU-MEDROL) injection  40 mg Intravenous Q24H  . multivitamin  15 mL Per Tube Daily  . pantoprazole sodium  40 mg Per Tube Q1200  . sodium chloride flush  10-40 mL Intracatheter Q12H  . thiamine  100 mg Per Tube Daily   Or  . thiamine  100 mg Intravenous Daily   Continuous Infusions: . sodium chloride Stopped (12/08/20 0436)  . feeding supplement (VITAL 1.5 CAL) 1,000 mL (12/16/20 0451)  . fentaNYL infusion INTRAVENOUS 300 mcg/hr (12/16/20 1330)  . midazolam 5 mg/hr (12/16/20 1330)  . penicillin g continuous IV infusion 9 Million Units (12/16/20 1031)   PRN Meds:.acetaminophen (TYLENOL) oral liquid 160 mg/5 mL, artificial tears, atropine, fentaNYL, labetalol, lip balm, midazolam, [DISCONTINUED] ondansetron **OR** ondansetron (ZOFRAN) IV, sodium chloride  flush, vecuronium  Current Labs: reviewed    Physical Exam:  Blood pressure 130/63, pulse (!) 103, temperature (!) 97.5 F (36.4 C), temperature source Axillary, resp. rate (!) 34, height 5' 7"  (1.702 m), weight 82.1 kg, SpO2 93 %. Pt not directly seen / examined 2/2 COVID19 status, discussed case with primary RN, CCM MD and PA.  A  1. AKI, nonoligiuric: Most likely this is ATN; AIN seems less likely.  1. Given robust and UOP and stable labs, no immediate indication to initiate RRT; azotemia is multifactorial 2. Can continue to use furosemide as needed 3. Renal ultrasound with some suggestion of catheter malfunction, flushes well 2. Severe COVID-19 pneumonia/ARDS/hypoxic ventilator dependent respiratory failure, per CCM 3. Anemia, severe, transfusing per CCM 4. Pulmonary emboli, acute, on anticoagulation, per CCM 5. Question of neurosyphilis, weakly positive RPR and positive TPA had started on penicillin but held because of question of AIN; I do not think AIN is most likely here   P . No indicaiton for RRT at this time . Prognosis remains poor . Probabaly recovery GFR at this point . Will follow along. Daily weights, Daily Renal Panel, Strict I/Os, Avoid nephrotoxins (NSAIDs, judicious IV Contrast)  . Medication Issues; o Preferred narcotic agents for pain control are hydromorphone, fentanyl, and methadone. Morphine should not be used.  o Baclofen should be avoided o Avoid oral sodium phosphate and magnesium citrate based laxatives / bowel preps    Pearson Grippe MD 12/16/2020, 2:42 PM  Recent Labs  Lab 12/09/20 1813 12/10/20 0424 12/10/20 0435 12/10/20 1701 12/11/20 0510 12/14/20 1400 12/15/20 0351 12/16/20 0443  NA  --   --    < >  --    < > 134* 137 144  K  --   --    < >  --    < > 5.4* 5.3* 5.3*  CL  --   --    < >  --    < > 101 102 105  CO2  --   --    < >  --    < > 16* 17* 19*  GLUCOSE  --   --    < >  --    < > 329* 177* 196*  BUN  --   --    < >  --    < >  115* 108* 157*  CREATININE  --  1.76*   < >  --    < > 3.44* 3.98* 3.55*  CALCIUM  --   --    < >  --    < > 8.6* 9.6 9.6  PHOS 3.8 3.4  --  3.6  --   --   --   --    < > = values in this interval not displayed.   Recent Labs  Lab 12/11/20 0510 12/12/20 0500 12/13/20 0500 12/15/20 0351 12/15/20 1438 12/16/20 0443  WBC 25.8* 26.5* 24.3* 21.8*  --  20.2*  NEUTROABS 21.5* 21.9* 21.4*  --   --   --   HGB 7.7* 8.1* 8.2* 6.9* 8.2* 8.1*  HCT 24.8* 25.9* 26.3* 21.7* 25.6* 25.8*  MCV 110.7* 112.1* 114.3* 114.2*  --  106.2*  PLT 213 239 297 227  --  192

## 2020-12-16 NOTE — Progress Notes (Signed)
Assisted tele visit to patient with daughter.  Zula Hovsepian M Zlatan Hornback, RN   

## 2020-12-16 NOTE — Progress Notes (Signed)
eLink Physician-Brief Progress Note Patient Name: [name] DOB: 06-06-1951 MRN: [id number]   Date of Service  12/16/2020  HPI/Events of Note  Patient has had saturations in the low to mid 80's,  Following recruitment maneuvers patient's saturation is up to 89 %.  eICU Interventions  Will check an ABG to determine if patient should be proned.        [name] [name] 12/16/2020, 3:55 AM

## 2020-12-16 NOTE — Progress Notes (Signed)
Assisted tele visit to patient with daughter.  Margaret Pyle, RN

## 2020-12-16 NOTE — Progress Notes (Signed)
eLink Physician-Brief Progress Note Patient Name: [name] DOB: 12/27/1950 MRN: [id number]   Date of Service  12/16/2020  HPI/Events of Note  Review of portable CXR reveals no pneumothorax to my reading. I suspect that [place] emphysema is dissecting from the mediastinum.   eICU Interventions  Plan: 1. Continue present management.  2. Follow closely for clinical signs of developing pneumothorax. 3. Repeat CXR already ordered for 6 AM.     Intervention Category Major Interventions: Other:  Lenell Antu 12/16/2020, 8:46 PM

## 2020-12-16 NOTE — Progress Notes (Signed)
eLink Physician-Brief Progress Note Patient Name: [name] DOB: [birth year] MRN: [id number]   Date of Service  12/16/2020  HPI/Events of Note  Nursing reports new finding of [place] emphysema in chest and bilateral neck. Sat = 94% on 100%/P 14.  eICU Interventions  Plan: 1. Portable CXR STAT to r/o pneumothorax.      Intervention Category Major Interventions: Other:  Lenell Antu 12/16/2020, 8:10 PM

## 2020-12-16 NOTE — Progress Notes (Signed)
Assisted tele visit to patient with daughter.  [name] M [name] Dumond, RN   

## 2020-12-16 NOTE — Progress Notes (Signed)
NAME:  NELLA BOTSFORD, MRN:  [id number], DOB:  1951/04/06, LOS: 17 ADMISSION DATE:  Dec 07, 2020, CONSULTATION DATE: 12/06/2020 REFERRING MD: Dr. Allena Katz, [facility] hospitalist, CHIEF COMPLAINT: Encephalopathy, hypoxia   Brief History:  70 year old smoker admitted 12/19 with acute hypoxic respiratory failure due to COVID 19 viral pneumonia. Initially required nonrebreather and high flow nasal cannula. She developed acute encephalopathy 12/26 and lactic acidosis requiring transfer to ICU.  Empirically being treated for neurosyphilis with a positive RPR and T. pallidum positive.  ICU course complicated by findings of segmental PE on anticoagulation, anemia with FOBT positive, & AKI.    Past Medical History:  Arthritis  Depression  GERD HTN Thyroid Disease  Tobacco Abuse  Significant Hospital Events:  12/19 admit with hypoxic respiratory failure in setting of COVID PNA 12/23 completed remdesivir 12/26 transferred to ICU, broad-spectrum antibiotics initiated.  Baricitinib stopped, cultures sent, eventually came back as gram-positive rods in 2 samples, CT of chest positive for pulmonary emboli systemic) started 12/27 intubated, required prone positioning 12/29 FiO2 and PEEP requirements improving, tapering steroids.  Still on pressors.  Escalating sliding scale for hyperglycemia. Started cont PCN G infusion as RPR was reactive and titer 1:1 w/ T Pallidum Abs Positive w/ delirium decided to treat as neurosyphilis (per ID suggestion).  12/30 BC finalized w/  CORYNEBACTERIUM PSEUDODIPHTHERIAE. Asked ID to formally see for ABX suggestions. Peep?FIO2 needs as well as CXR better. Renal fxn little worse  1/02 oxygen requirement worsened overnight 1/03 FiO2 70%, PEEP 12, Peak 35, Pplat 33, driving pressure 21  0/63 Changed to DNR  1/05 Negative balance with lasix, despite this rising needs in vent support   Consults:  PCCM-12/26  Procedures:  ETT 12/27 >> RUE PICC 12/28 >>   Significant  Diagnostic Tests:   Head CT 12/26 >> encephalomalacia related to remote infarcts in left parietal occipital, right occipital and left temporal lobe -new compared to 2014.  CT angiogram chest 12/26 >> acute pulmonary emboli within segmental branches of left lower lobe, small clot burden, no evidence of RV strain  CT angiogram chest 12/21 >> no evidence of pulmonary embolus, diffuse interlobular septal thickening with GGO especially basis  Micro Data:  BCID 12/19 >> staph species detected  BCx2 12/19 >> staph hominis (aerobic bottle only) MRSA PCR 12/26 >> negative  BCx2 12/26 >> corynebacterium pseudodiphtheriae  RPR 12/26 >> reactive  T.Pallidium 12/26 >> reactive  BCx2 12/30 >> negative   Antimicrobials:  Zosyn 12/26 >> 12/30 Vancomycin 12/26 >> 12/30 Cont PCN G infusion 12/29 >> x14-day treatment    Interim History / Subjective:  Afebrile / WBC down to 20.2  On vent 14 PEEP, 100% fiO2, Peak 37, Pplat 34, driving pressure 20  Glucose range 204-251 I/O 6.3L UOP, -617 ml in last 24 hours   Objective   Blood pressure 130/63, pulse (!) 103, temperature (!) 97.5 F (36.4 C), temperature source Axillary, resp. rate (!) 34, height 5\' 7"  (1.702 m), weight 82.1 kg, SpO2 93 %.    Vent Mode: PRVC FiO2 (%):  [75 %-100 %] 100 % Set Rate:  [34 bmp] 34 bmp Vt Set:  [490 mL] 490 mL PEEP:  [12 cmH20-14 cmH20] 14 cmH20 Plateau Pressure:  [34 cmH20-36 cmH20] 34 cmH20   Intake/Output Summary (Last 24 hours) at 12/16/2020 1338 Last data filed at 12/16/2020 1330 Gross per 24 hour  Intake 5708.9 ml  Output 5400 ml  Net 308.9 ml   Filed Weights   12/13/20 0523 12/15/20 0412 12/16/20 0448  Weight: 83.3 kg 87.3 kg 82.1 kg    Examination: General: critically ill appearing adult female lying in bed on vent in NAD HEENT: MM pink/moist, ETT, anicteric, mild scleral edema R>L, pupils 100mm Neuro: sedate  CV: s1s2 RRR, no m/r/g PULM: non-labored on vent, lungs bilaterally clear GI: soft, bsx4  active  Extremities: warm/dry, dependent edema 2+, worse in upper bilateral upper extremities than LE's Skin: no rashes or lesions  Resolved Hospital Problem list   Thrombocytopenia  Septic shock  Assessment & Plan:   Acute hypoxemic respiratory failure secondary to Covid PNA ARDS Pulmonary Embolism -low Vt ventilation 4-8cc/kg -goal plateau pressure <30, driving pressure [id number] cm H2O -target PaO2 55-65, titrate PEEP/FiO2 per ARDS protocol  -if P/F ratio <150, consider prone therapy for 16 hours per day -goal CVP <4, diuresis as necessary -VAP prevention measures  -follow intermittent CXR  -solumdedrol 40 mg Q24, taper to off pending O2 response -continue lovenox for PE, monitor for bleeding / dose adjust for AKI  -concerned despite negative fluid balance that her O2 needs worsening   Sedation Needs while on Mechanical Ventilation  -PAD protocol with RASS for -2 to -3 + vent synchrony  -PRN NMB if needed for vent synchrony  -versed / fentanyl gtts  Presumptive diagnosis of Neurosyphilis -PCN restarted, D7/14  -appreciate ID assistance   Possible Seizures Seen on admit  -continue keppra  Acute Kidney Injury Hyperkalemia -kayexalate 30g x1  -hold lasix 1/5 > note I/O's inaccurate with TF addition  -appreciate Nephrology evaluation  -no indication for HD at this time  -Trend BMP / urinary output -Replace electrolytes as indicated -Avoid nephrotoxic agents, ensure adequate renal perfusion  Hx CVA with R sided Weakness  CT showing evidence of encephalomalacia in the frontoparietal region -supportive care  -early PT efforts when able   Leukocytosis   -monitor   Anemia  FOBT positive, transfused 1/4  -trend CBC  -transfuse for Hgb <7% or evidence of active bleeding  -will need GI evaluation at some point  Hypothyroidism -synthroid   Hyperglycemia -SSI, resistant scale  -add lantus 5 units BID  -TF 10 units Q4   Moderate Protein Calorie Malnutrition -TF per  Nutrition   Best practice (evaluated daily)  Diet: TF Pain/Anxiety/Delirium protocol (if indicated): Fentanyl and versed, intermittent paralytics VAP protocol (if indicated):ordered DVT prophylaxis: Lovenox GI prophylaxis: PPI  Glucose control: SSI Mobility: Bedrest Disposition: ICU  Goals of Care:  Last date of multidisciplinary goals of care discussion: Rodena Piety, daughter, called 1/3 for update.  Spoke with patient's niece as well Charise Carwin, she is an Therapist, sports at [employer]).  Family updated 1/5 via E-ICU visit. Code Status: Full code      Critical Care Time: 33 minutes  Noe Gens, MSN, NP-C, AGACNP-BC [facility] 12/16/2020, 1:38 PM   Please see [email or web address] for pager details.

## 2020-12-17 ENCOUNTER — Inpatient Hospital Stay (HOSPITAL_COMMUNITY): Payer: Medicare Other

## 2020-12-17 DIAGNOSIS — U071 COVID-19: Secondary | ICD-10-CM | POA: Diagnosis not present

## 2020-12-17 DIAGNOSIS — J9600 Acute respiratory failure, unspecified whether with hypoxia or hypercapnia: Secondary | ICD-10-CM | POA: Diagnosis not present

## 2020-12-17 LAB — GLUCOSE, CAPILLARY
Glucose-Capillary: 207 mg/dL — ABNORMAL HIGH (ref 70–99)
Glucose-Capillary: 148 mg/dL — ABNORMAL HIGH (ref 70–99)
Glucose-Capillary: 216 mg/dL — ABNORMAL HIGH (ref 70–99)
Glucose-Capillary: 254 mg/dL — ABNORMAL HIGH (ref 70–99)
Glucose-Capillary: 267 mg/dL — ABNORMAL HIGH (ref 70–99)
Glucose-Capillary: 250 mg/dL — ABNORMAL HIGH (ref 70–99)

## 2020-12-17 LAB — BASIC METABOLIC PANEL
Anion gap: 17 — ABNORMAL HIGH (ref 5–15)
BUN: 171 mg/dL — ABNORMAL HIGH (ref 8–23)
CO2: 23 mmol/L (ref 22–32)
Calcium: 9.2 mg/dL (ref 8.9–10.3)
Chloride: 107 mmol/L (ref 98–111)
Creatinine, Ser: 4.06 mg/dL — ABNORMAL HIGH (ref 0.44–1.00)
GFR, Estimated: 11 mL/min — ABNORMAL LOW (ref >60)
Glucose, Bld: 275 mg/dL — ABNORMAL HIGH (ref 70–99)
Potassium: 5.7 mmol/L — ABNORMAL HIGH (ref 3.5–5.1)
Sodium: 147 mmol/L — ABNORMAL HIGH (ref 135–145)

## 2020-12-17 LAB — COMPREHENSIVE METABOLIC PANEL
ALT: 17 U/L (ref 0–44)
AST: 27 U/L (ref 15–41)
Albumin: 1.5 g/dL — ABNORMAL LOW (ref 3.5–5.0)
Alkaline Phosphatase: 108 U/L (ref 38–126)
Anion gap: 16 — ABNORMAL HIGH (ref 5–15)
BUN: 160 mg/dL — ABNORMAL HIGH (ref 8–23)
CO2: 23 mmol/L (ref 22–32)
Calcium: 9.5 mg/dL (ref 8.9–10.3)
Chloride: 109 mmol/L (ref 98–111)
Creatinine, Ser: 3.87 mg/dL — ABNORMAL HIGH (ref 0.44–1.00)
GFR, Estimated: 12 mL/min — ABNORMAL LOW (ref >60)
Glucose, Bld: 182 mg/dL — ABNORMAL HIGH (ref 70–99)
Potassium: 5.4 mmol/L — ABNORMAL HIGH (ref 3.5–5.1)
Sodium: 148 mmol/L — ABNORMAL HIGH (ref 135–145)
Total Bilirubin: 1 mg/dL (ref 0.3–1.2)
Total Protein: 6.7 g/dL (ref 6.5–8.1)

## 2020-12-17 LAB — C-REACTIVE PROTEIN: CRP: 48.2 mg/dL — ABNORMAL HIGH (ref <1.0)

## 2020-12-17 LAB — D-DIMER, QUANTITATIVE: D-Dimer, Quant: 11.76 ug/mL-FEU — ABNORMAL HIGH (ref 0.00–0.50)

## 2020-12-17 LAB — MAGNESIUM: Magnesium: 2.4 mg/dL (ref 1.7–2.4)

## 2020-12-17 LAB — FERRITIN: Ferritin: 4270 ng/mL — ABNORMAL HIGH (ref 11–307)

## 2020-12-17 MED ORDER — HEPARIN (PORCINE) 2000 UNITS/L FOR CRRT: INTRAVENOUS_CENTRAL | Status: DC | PRN

## 2020-12-17 MED ORDER — PRISMASOL BGK 0/2.5 32-2.5 MEQ/L EC SOLN
Status: DC
  Filled 2020-12-17 (×4): qty 5000

## 2020-12-17 MED ORDER — "THROMBI-PAD 3""X3"" EX PADS"
1.0000 | MEDICATED_PAD | Freq: Once | CUTANEOUS | Status: DC
  Filled 2020-12-17 (×2): qty 1

## 2020-12-17 MED ORDER — HEPARIN SODIUM (PORCINE) 1000 UNIT/ML IJ SOLN
1000.0000 [IU] | INTRAMUSCULAR | Status: DC | PRN
  Administered 2020-12-17: 3000 [IU]
  Filled 2020-12-17 (×3): qty 6

## 2020-12-17 MED ORDER — PRISMASOL BGK 0/2.5 32-2.5 MEQ/L EC SOLN
Status: DC
  Filled 2020-12-17 (×13): qty 5000

## 2020-12-17 MED ORDER — FREE WATER
100.0000 mL | Status: DC
  Administered 2020-12-17: 100 mL

## 2020-12-17 MED ORDER — SODIUM CHLORIDE 0.9 % IV SOLN
2.0000 g | Freq: Two times a day (BID) | INTRAVENOUS | Status: DC
  Administered 2020-12-18: 2 g via INTRAVENOUS
  Filled 2020-12-17: qty 2

## 2020-12-17 MED ORDER — PRISMASOL BGK 0/2.5 32-2.5 MEQ/L EC SOLN
Status: DC
  Filled 2020-12-17 (×5): qty 5000

## 2020-12-17 MED ORDER — HYDRALAZINE HCL 10 MG PO TABS
10.0000 mg | ORAL_TABLET | Freq: Three times a day (TID) | ORAL | Status: DC
  Administered 2020-12-18: 10 mg
  Filled 2020-12-17: qty 1

## 2020-12-17 MED ORDER — SODIUM CHLORIDE 0.9 % IV SOLN
2.0000 g | Freq: Every day | INTRAVENOUS | Status: DC
  Administered 2020-12-17: 2 g via INTRAVENOUS
  Filled 2020-12-17: qty 2

## 2020-12-17 NOTE — Progress Notes (Signed)
Assisted tele visit to patient with family member.  Jesiah Yerby P, RN  

## 2020-12-17 NOTE — Progress Notes (Signed)
Pharmacy Antibiotic Note  [name] is a 70 y.o. female admitted on 12-25-20 with COVID; admission complicated by acute encephalopathy, PE, GIB, and AKI. Patient had been receiving treatment for possible neurosyphilis with Pen G based on a positive RPR titer and no record of previous treatment. Now with new fevers and worsening respiratory secretions. Pharmacy has been consulted for cefepime dosing.    Discussed cefepime's lack of syphilis coverage (or rather, lack of data supporting efficacy in neurosyphilis) with CCM. Given worrisome clinical decline, and unconfirmed neurosyphilis, opted to forego syphilis coverage and focus on covering nosocomial GNR infection.   Also of note, patient to start CRRT tonight (1/6 PM)  Plan:  Cefepime 2g IV q12 hr while on CRRT (based on previous CrCl, would reduce to 2g q24 hr once CRRT stops)  Height: 5\' 7"  (170.2 cm) Weight: 85.3 kg (188 lb 0.8 oz) IBW/kg (Calculated) : 61.6  Temp (24hrs), Avg:99 F (37.2 C), Min:97.7 F (36.5 C), Max:101.6 F (38.7 C)  Recent Labs  Lab 12/11/20 0510 12/12/20 0500 12/13/20 0500 12/14/20 0500 12/14/20 1400 12/15/20 0351 12/16/20 0443 12/17/20 0909 12/17/20 1500  WBC 25.8* 26.5* 24.3*  --   --  21.8* 20.2*  --   --   CREATININE 1.52* 1.48* 1.47*   < > 3.44* 3.98* 3.55* 3.87* 4.06*   < > = values in this interval not displayed.    Estimated Creatinine Clearance: 14.9 mL/min (A) (by C-G formula based on SCr of 4.06 mg/dL (H)).    No Known Allergies  Azithro 12/19 >> 12/23 Remdesivir 12/19 >> 12/23  Baricitinib 12/20 >> 12/26 Vanc 12/26 >> 12/30 Zosyn 12/26 >> 12/30 PenG 12/29 >> (dose held on 12/4 to r/o AIN)>> resumed 1/5>> 1/6 1/6 Cefepime >>   12/19 BCx: 1/4 Staph hominis, likely contaminant  12/26 BCx: BCID 12/28 with GPR 2/4 aerobic only > resulted as Corynebacterium pseudodiphtheriae  12/26 MRSA PCR: neg 12/26 RPR reactive: titer 1:1 12/26 T pallidum Ab: reactive 12/30 BCx: neg  FINAL  Thank you for allowing pharmacy to be a part of this patient's care.  Eustace Hur A 12/17/2020 8:36 PM

## 2020-12-17 NOTE — Progress Notes (Signed)
Nephrology follow-up, brief note: Please see progress note by Dr. Marisue Humble from this morning for details.  The afternoon lab reviewed.  Sodium 147, potassium worsened to 5.7, creatinine level 4 and BUN level is pending.  Worsening renal parameters, oxygenation.  Urine output recorded 550 cc only.  I have discussed with the patient's daughter over the phone.  She agreed with starting CRRT for 24 to 48 hours to see if there is any clinical improvement.  Discussed poor prognosis. Discussed with ICU team, Dr. Judeth Horn.  Plan to initiate CRRT once catheter is in. All 2K bath, already on heparin, UF as tolerated, DC free water.  Johnson Arizola. Ronalee Belts, MD [facility].

## 2020-12-17 NOTE — Progress Notes (Addendum)
NAME:  [name], MRN:  [id number], DOB:  1951-02-23, LOS: 41 ADMISSION DATE:  11/21/2020, CONSULTATION DATE: 12/06/2020 REFERRING MD: Dr. Posey Pronto, [facility] hospitalist, CHIEF COMPLAINT: Encephalopathy, hypoxia   Brief History:  70 year old smoker admitted 12/19 with acute hypoxic respiratory failure due to COVID 19 viral pneumonia. Initially required nonrebreather and high flow nasal cannula. She developed acute encephalopathy 12/26 and lactic acidosis requiring transfer to ICU.  Empirically being treated for neurosyphilis with a positive RPR and T. pallidum positive.  ICU course complicated by findings of segmental PE on anticoagulation, anemia with FOBT positive, & AKI.    Past Medical History:  Arthritis  Depression  GERD HTN Thyroid Disease  Tobacco Abuse  Significant Hospital Events:  12/19 admit with hypoxic respiratory failure in setting of COVID PNA 12/23 completed remdesivir 12/26 transferred to ICU, broad-spectrum antibiotics initiated.  Baricitinib stopped, cultures sent, eventually came back as gram-positive rods in 2 samples, CT of chest positive for pulmonary emboli systemic) started 12/27 intubated, required prone positioning 12/29 FiO2 and PEEP requirements improving, tapering steroids.  Still on pressors.  Escalating sliding scale for hyperglycemia. Started cont PCN G infusion as RPR was reactive and titer 1:1 w/ T Pallidum Abs Positive w/ delirium decided to treat as neurosyphilis (per ID suggestion).  12/30 BC finalized w/  CORYNEBACTERIUM PSEUDODIPHTHERIAE. Asked ID to formally see for ABX suggestions. Peep?FIO2 needs as well as CXR better. Renal fxn little worse  1/02 oxygen requirement worsened overnight 1/03 FiO2 70%, PEEP 12, Peak 35, Pplat 33, driving pressure 21  1/04 Changed to DNR  1/05 Negative balance with lasix, despite this rising needs in vent support  1/06 Sr cr elevated but making good urine, no indication for HD, pneumomediastinum on CXR. Cuff leak  on ETT  Consults:  PCCM-12/26  Procedures:  ETT 12/27 >> RUE PICC 12/28 >>   Significant Diagnostic Tests:   Head CT 12/26 >> encephalomalacia related to remote infarcts in left parietal occipital, right occipital and left temporal lobe -new compared to 2014.  CT angiogram chest 12/26 >> acute pulmonary emboli within segmental branches of left lower lobe, small clot burden, no evidence of RV strain  CT angiogram chest 12/21 >> no evidence of pulmonary embolus, diffuse interlobular septal thickening with GGO especially basis  Micro Data:  BCID 12/19 >> staph species detected  BCx2 12/19 >> staph hominis (aerobic bottle only) MRSA PCR 12/26 >> negative  BCx2 12/26 >> corynebacterium pseudodiphtheriae  RPR 12/26 >> reactive  T.Pallidium 12/26 >> reactive  BCx2 12/30 >> negative  Tracheal Aspirate 1/6 >>  BCx2 1/6 >>   Antimicrobials:  Zosyn 12/26 >> 12/30 Vancomycin 12/26 >> 12/30 Cont PCN G infusion 12/29 >> x14-day treatment   Cefepime 1/6 >>   Interim History / Subjective:  Tmax 101.6  Vent - PEEP 14, FiO2 100%  Glucose range 148-182  I/O UOP 1.8L, +362 in last 24 hours  Foley removed  Objective   Blood pressure 125/63, pulse 94, temperature 98.5 F (36.9 C), temperature source Axillary, resp. rate (!) 34, height 5\' 7"  (1.702 m), weight 85.3 kg, SpO2 92 %. CVP:  [9 mmHg-17 mmHg] 9 mmHg  Vent Mode: PRVC FiO2 (%):  [100 %] 100 % Set Rate:  [34 bmp] 34 bmp Vt Set:  [490 mL] 490 mL PEEP:  [14 cmH20] 14 cmH20 Plateau Pressure:  [30 cmH20-34 cmH20] 34 cmH20   Intake/Output Summary (Last 24 hours) at 12/17/2020 1412 Last data filed at 12/17/2020 1200 Gross per 24 hour  Intake 2889.18 ml  Output 2200 ml  Net 689.18 ml   Filed Weights   12/15/20 0412 12/16/20 0448 12/17/20 0500  Weight: 87.3 kg 82.1 kg 85.3 kg    Examination: General: adult female lying in bed in NAD on vent  HEENT: MM pink/moist, ETT, anicteric, mild scleral edema  Neuro: sedate  CV: s1s2  RRR, no m/r/g PULM: non-labored on vent, lungs bilaterally with rhonchi, yellow secretions from [place]  GI: soft, bsx4 active  Extremities: warm/dry, BUE dependent edema  Skin: no rashes or lesions  PCXR 1/6 >> images personally reviewed, ETT at clavicular heads  Resolved Hospital Problem list   Thrombocytopenia  Septic shock  Assessment & Plan:   Acute hypoxemic respiratory failure secondary to Covid PNA ARDS Pulmonary Embolism -low Vt ventilation 4-8cc/kg -goal plateau pressure <30, driving pressure <63 cm O7F -target PaO2 55-65, titrate PEEP/FiO2 per ARDS protocol  -if P/F ratio <150, consider prone therapy for 16 hours per day -goal CVP <4, diuresis as necessary -VAP prevention measures  -follow intermittent CXR  -solumdedrol 40 mg QD, taper to off pending O2 response -continue lovenox for PE, dose adjust for AKI -cuff leak noted on exam, ETT exchange explained to daughter via phone, discussed the risk of procedure giving high FiO2 needs.   Sedation Needs while on Mechanical Ventilation  -PAD protocol, RASS goal -2 to -3 with vent synchrony  -minimize benzo as she is uremic / will be slow to clear  -PRN NMB for vent synchrony  -versed / fentanyl gtt   Presumptive diagnosis of Neurosyphilis -Continue PCN, D8/14  -appreciate ID assistance   Fever  New 1/6 -assess tracheal aspirate, blood cultures  -add cefepime   Possible Seizures Seen on admit  -keppra   Acute Kidney Injury Hyperkalemia Hypernatremia  -Trend BMP / urinary output -Replace electrolytes as indicated -Avoid nephrotoxic agents, ensure adequate renal perfusion -hold lasix 1/6, reassess in am 1/7  -appreciate Nephrology input  -add free water 100 ml Q4 (likely from lokelma / kayexalate Na load)  Hx CVA with R sided Weakness  CT showing evidence of encephalomalacia in the frontoparietal region -supportive care  -PT efforts when able   Leukocytosis   -monitor WBC trend  Anemia  FOBT  positive, transfused 1/4  -trend CBC  -transfuse for Hgb <7% or evidence of active bleeding  -will need GI evaluation at some point  Hypothyroidism -synthroid   Hyperglycemia -SSI, resistant scale  -lantus 5 units BID  -TF 10 units Q4   SSI, resistant scale  -add lantus 5 units BID  -TF 10 units Q4   Moderate Protein Calorie Malnutrition -TF per Nutrition   Best practice (evaluated daily)  Diet: TF Pain/Anxiety/Delirium protocol (if indicated): Fentanyl and versed, intermittent paralytics VAP protocol (if indicated):ordered DVT prophylaxis: Lovenox GI prophylaxis: PPI  Glucose control: SSI Mobility: Bedrest Disposition: ICU  Goals of Care:  Last date of multidisciplinary goals of care discussion: Synetta Fail, daughter, called 1/6 for update.  Spoke with patient's niece as well Feliz Beam, she is an Charity fundraiser at [employer]).  Reviewed advanced support and limited progress over past 48 hours.  Asked family to consider acute illness and what the patient would want in the current situation.  Code Status: Full code      Critical Care Time: 34 minutes  Canary Brim, MSN, NP-C, AGACNP-BC [facility] 12/17/2020, 2:12 PM   Please see [email or web address] for pager details.

## 2020-12-17 NOTE — Progress Notes (Signed)
eLink Physician-Brief Progress Note Patient Name: [name] DOB: 26-Aug-1951 MRN: [id number]   Date of Service  12/17/2020  HPI/Events of Note  Request to review CXR for R IJ HD catheter position. R IJ HD catheter tip in SVC. No pneumothorax.   eICU Interventions  OK to use R IJ HD catheter.      Intervention Category Major Interventions: Other:  Lenell Antu 12/17/2020, 9:43 PM

## 2020-12-17 NOTE — Procedures (Signed)
Central Venous Catheter Insertion Procedure Note  [name]  [id number]  1951/06/28  Date:12/17/20  Time:6:22 PM   Provider Performing:Amariyana Heacox R Kamree Wiens   Procedure: Insertion of Non-tunneled Central Venous Catheter(36556)with US guidance (09735)    Indication(s) Hemodialysis  Consent Risks of the procedure as well as the alternatives and risks of each were explained to the patient and/or caregiver.  Consent for the procedure was obtained and is signed in the bedside chart  Anesthesia none  Timeout Verified patient identification, verified procedure, site/side was marked, verified correct patient position, special equipment/implants available, medications/allergies/relevant history reviewed, required imaging and test results available.  Sterile Technique Maximal sterile technique including full sterile barrier drape, hand hygiene, sterile gown, sterile gloves, mask, hair covering, sterile ultrasound probe cover (if used).  Procedure Description Area of catheter insertion was cleaned with chlorhexidine and draped in sterile fashion.   With real-time ultrasound guidance a HD catheter was placed into the right internal jugular vein. Wire was clearly visualized in the IJ. Nonpulsatile blood flow and easy flushing noted in all ports.  The catheter was sutured in place and sterile dressing applied.  Complications/Tolerance None; patient tolerated the procedure well. Chest X-ray is ordered to verify placement for internal jugular or subclavian cannulation.  Chest x-ray is not ordered for femoral cannulation.  EBL Minimal  Specimen(s) None

## 2020-12-17 NOTE — Progress Notes (Signed)
Pharmacy - CRRT  Assessment: patient started on trial of CRRT for AKI. Renally adjusted meds reviewed.  Plan:  Cefepime - adjusted to 2g q12 (see abx note)  Hydralazine - adjusted to 10 mg q8 hr (not dialyzable)  Keppra - CRRT dose range is higher than current dosing, but will not adjust since recently started for unconfirmed seizures and was never titrated to seizure control  If breakthrough seizures occur, may titrate up to [phone] mg q12 hr while on CRRT  Enoxaparin   Recommend switching to heparin as Lovenox is not fully dialyzable, and may lead to accumulation of active heparin metabolites that are not detected by Anti-Xa assays  Recommend starting heparin at time of next Lovenox dose due (1/7 at ~9a)  Pharmacy will continue to review meds daily and adjust as needed for CRRT  Bernadene Person, PharmD, BCPS [phone] 12/17/2020, 9:21 PM

## 2020-12-17 NOTE — Progress Notes (Signed)
eLink Physician-Brief Progress Note Patient Name: [name] DOB: 1951-06-29 MRN: [id number]   Date of Service  12/17/2020  HPI/Events of Note  Sinus Tachycardia - HR = 123. BP = 127/67. CVP = 17. Temp = 98.6. Hgb = 8.1.  eICU Interventions  Continue to observe HR for present.  Temp trending up. If he spikes fever > 100.5 F, would treat fever as possible cause of sinus tachycardia.      Intervention Category Major Interventions: Arrhythmia - evaluation and management  Mihran Lebarron Eugene 12/17/2020, 1:30 AM

## 2020-12-17 NOTE — Progress Notes (Signed)
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 Severe resp decline, now 100% FIO2 . 1.8 UOP, SCr up a tad on AML, BUN pending, K 5.4.    01/05 0701 - 01/06 0700 In: 2612.7 [I.V.:830.1; NG/GT:1092.5; IV Piggyback:690.1] Out: 2250 [Urine:1850; Stool:400]  Filed Weights   12/15/20 0412 12/16/20 0448 12/17/20 0500  Weight: 87.3 kg 82.1 kg 85.3 kg    Scheduled Meds: . artificial tears  1 application Both Eyes L5B  . chlorhexidine gluconate (MEDLINE KIT)  15 mL Mouth Rinse BID  . Chlorhexidine Gluconate Cloth  6 each Topical Q0600  . enoxaparin (LOVENOX) injection  80 mg Subcutaneous Q24H  . feeding supplement (PROSource TF)  90 mL Per Tube BID  . folic acid  1 mg Per Tube Daily  . hydrALAZINE  10 mg Per Tube Q6H  . insulin aspart  0-20 Units Subcutaneous Q4H  . insulin aspart  10 Units Subcutaneous Q4H  . insulin glargine  5 Units Subcutaneous BID  . levETIRAcetam  500 mg Per Tube BID  . levothyroxine  125 mcg Per Tube Q0600  . mouth rinse  15 mL Mouth Rinse 10 times per day  . methylPREDNISolone (SOLU-MEDROL) injection  40 mg Intravenous Q24H  . multivitamin  15 mL Per Tube Daily  . pantoprazole sodium  40 mg Per Tube Q1200  . sodium chloride flush  10-40 mL Intracatheter Q12H  . thiamine  100 mg Per Tube Daily   Or  . thiamine  100 mg Intravenous Daily   Continuous Infusions: . sodium chloride Stopped (12/08/20 0436)  . feeding supplement (VITAL 1.5 CAL) 1,000 mL (12/16/20 0451)  . fentaNYL infusion INTRAVENOUS 300 mcg/hr (12/17/20 0800)  . midazolam 4 mg/hr (12/17/20 0910)  . penicillin g continuous IV infusion 9 Million Units (12/17/20 0858)   PRN Meds:.acetaminophen (TYLENOL) oral liquid 160 mg/5 mL, artificial tears, atropine, fentaNYL, labetalol, lip balm, midazolam, [DISCONTINUED] ondansetron **OR**  ondansetron (ZOFRAN) IV, sodium chloride flush, vecuronium  Current Labs: reviewed    Physical Exam:  Blood pressure (!) 117/54, pulse (!) 121, temperature (!) 101.6 F (38.7 C), temperature source Axillary, resp. rate (!) 34, height 5' 7"  (1.702 m), weight 85.3 kg, SpO2 96 %. Pt not directly seen / examined 2/2 COVID19 status, discussed case with primary RN, CCM MD and PA.  A  1. AKI, nonoligiuric: Most likely this is ATN; AIN seems less likely.  1. Given robust and UOP, no immediate indication to initiate RRT; azotemia is multifactorial 2. Can continue to use furosemide as needed 3. Renal ultrasound with some suggestion of catheter malfunction, flushes well doubt clinically significant 2. Severe COVID-19 pneumonia/ARDS/hypoxic ventilator dependent respiratory failure, per CCM 3. Anemia, severe, transfusing per CCM 4. Pulmonary emboli, acute, on anticoagulation, per CCM 5. Question of neurosyphilis, weakly positive RPR and positive TPA had started on penicillin but held because of question of AIN; I do not think AIN is most likely here   P . BID labs to make sure stable, perhaps becoming more likely will req CRRT . Cont GOC as prognosis remains poor given severe MSOF . Will follow along. Daily weights, Daily Renal Panel, Strict I/Os, Avoid nephrotoxins (NSAIDs, judicious IV Contrast)  . Medication Issues; o Preferred narcotic agents for pain control are hydromorphone, fentanyl, and methadone. Morphine should not be used.  o Baclofen should be avoided o Avoid oral sodium phosphate and magnesium citrate  based laxatives / bowel preps    Pearson Grippe MD 12/17/2020, 9:56 AM  Recent Labs  Lab 12/10/20 1701 12/11/20 0510 12/15/20 0351 12/16/20 0443 12/17/20 0909  NA  --    < > 137 144 148*  K  --    < > 5.3* 5.3* 5.4*  CL  --    < > 102 105 109  CO2  --    < > 17* 19* 23  GLUCOSE  --    < > 177* 196* 182*  BUN  --    < > 108* 157* PENDING  CREATININE  --    < > 3.98* 3.55*  3.87*  CALCIUM  --    < > 9.6 9.6 9.5  PHOS 3.6  --   --   --   --    < > = values in this interval not displayed.   Recent Labs  Lab 12/11/20 0510 12/12/20 0500 12/13/20 0500 12/15/20 0351 12/15/20 1438 12/16/20 0443  WBC 25.8* 26.5* 24.3* 21.8*  --  20.2*  NEUTROABS 21.5* 21.9* 21.4*  --   --   --   HGB 7.7* 8.1* 8.2* 6.9* 8.2* 8.1*  HCT 24.8* 25.9* 26.3* 21.7* 25.6* 25.8*  MCV 110.7* 112.1* 114.3* 114.2*  --  106.2*  PLT 213 239 297 227  --  192

## 2020-12-17 NOTE — Progress Notes (Signed)
Assisted tele visit to patient with daughter and family member.  Aneli Zara D Marcelles Clinard, RN  

## 2020-12-18 ENCOUNTER — Inpatient Hospital Stay (HOSPITAL_COMMUNITY): Payer: Medicare Other

## 2020-12-18 LAB — GLUCOSE, CAPILLARY
Glucose-Capillary: 233 mg/dL — ABNORMAL HIGH (ref 70–99)
Glucose-Capillary: 223 mg/dL — ABNORMAL HIGH (ref 70–99)
Glucose-Capillary: 243 mg/dL — ABNORMAL HIGH (ref 70–99)
Glucose-Capillary: 203 mg/dL — ABNORMAL HIGH (ref 70–99)

## 2020-12-18 LAB — RENAL FUNCTION PANEL
Albumin: 1.6 g/dL — ABNORMAL LOW (ref 3.5–5.0)
Albumin: 1.9 g/dL — ABNORMAL LOW (ref 3.5–5.0)
Anion gap: 12 (ref 5–15)
Anion gap: 17 — ABNORMAL HIGH (ref 5–15)
BUN: 111 mg/dL — ABNORMAL HIGH (ref 8–23)
BUN: 95 mg/dL — ABNORMAL HIGH (ref 8–23)
CO2: 25 mmol/L (ref 22–32)
CO2: 24 mmol/L (ref 22–32)
Calcium: 8.8 mg/dL — ABNORMAL LOW (ref 8.9–10.3)
Calcium: 9 mg/dL (ref 8.9–10.3)
Chloride: 106 mmol/L (ref 98–111)
Chloride: 101 mmol/L (ref 98–111)
Creatinine, Ser: 2.37 mg/dL — ABNORMAL HIGH (ref 0.44–1.00)
Creatinine, Ser: 2.18 mg/dL — ABNORMAL HIGH (ref 0.44–1.00)
GFR, Estimated: 22 mL/min — ABNORMAL LOW (ref >60)
GFR, Estimated: 24 mL/min — ABNORMAL LOW (ref >60)
Glucose, Bld: 256 mg/dL — ABNORMAL HIGH (ref 70–99)
Glucose, Bld: 219 mg/dL — ABNORMAL HIGH (ref 70–99)
Phosphorus: 4.2 mg/dL (ref 2.5–4.6)
Phosphorus: 6 mg/dL — ABNORMAL HIGH (ref 2.5–4.6)
Potassium: 4.5 mmol/L (ref 3.5–5.1)
Potassium: 5.2 mmol/L — ABNORMAL HIGH (ref 3.5–5.1)
Sodium: 143 mmol/L (ref 135–145)
Sodium: 142 mmol/L (ref 135–145)

## 2020-12-18 LAB — HEMOGLOBIN AND HEMATOCRIT, BLOOD
HCT: 27.1 % — ABNORMAL LOW (ref 36.0–46.0)
Hemoglobin: 8.4 g/dL — ABNORMAL LOW (ref 12.0–15.0)

## 2020-12-18 LAB — FERRITIN: Ferritin: 4460 ng/mL — ABNORMAL HIGH (ref 11–307)

## 2020-12-18 LAB — MAGNESIUM: Magnesium: 2.3 mg/dL (ref 1.7–2.4)

## 2020-12-18 LAB — CBC
HCT: 19.5 % — ABNORMAL LOW (ref 36.0–46.0)
Hemoglobin: 6 g/dL — CL (ref 12.0–15.0)
MCH: 33.1 pg (ref 26.0–34.0)
MCHC: 30.8 g/dL (ref 30.0–36.0)
MCV: 107.7 fL — ABNORMAL HIGH (ref 80.0–100.0)
Platelets: 167 10*3/uL (ref 150–400)
RBC: 1.81 MIL/uL — ABNORMAL LOW (ref 3.87–5.11)
RDW: 19.7 % — ABNORMAL HIGH (ref 11.5–15.5)
WBC: 18.3 10*3/uL — ABNORMAL HIGH (ref 4.0–10.5)
nRBC: 4 % — ABNORMAL HIGH (ref 0.0–0.2)

## 2020-12-18 LAB — D-DIMER, QUANTITATIVE: D-Dimer, Quant: 11.15 ug/mL-FEU — ABNORMAL HIGH (ref 0.00–0.50)

## 2020-12-18 LAB — PREPARE RBC (CROSSMATCH)

## 2020-12-18 LAB — C-REACTIVE PROTEIN: CRP: 37.1 mg/dL — ABNORMAL HIGH (ref <1.0)

## 2020-12-18 LAB — HEPARIN LEVEL (UNFRACTIONATED): Heparin Unfractionated: 0.44 IU/mL (ref 0.30–0.70)

## 2020-12-18 MED ORDER — MIDAZOLAM HCL 2 MG/2ML IJ SOLN: 1.0000 mg | INTRAMUSCULAR | Status: DC | PRN

## 2020-12-18 MED ORDER — GLYCOPYRROLATE 0.2 MG/ML IJ SOLN: 0.2000 mg | INTRAMUSCULAR | Status: DC | PRN

## 2020-12-18 MED ORDER — GLYCOPYRROLATE 1 MG PO TABS: 1.0000 mg | ORAL_TABLET | ORAL | Status: DC | PRN

## 2020-12-18 MED ORDER — ACETAMINOPHEN 325 MG PO TABS: 650.0000 mg | ORAL_TABLET | Freq: Four times a day (QID) | ORAL | Status: DC | PRN

## 2020-12-18 MED ORDER — ACETAMINOPHEN 650 MG RE SUPP: 650.0000 mg | Freq: Four times a day (QID) | RECTAL | Status: DC | PRN

## 2020-12-18 MED ORDER — MORPHINE SULFATE (PF) 2 MG/ML IV SOLN
2.0000 mg | INTRAVENOUS | Status: DC | PRN
Start: 2020-12-18 — End: 2020-12-19

## 2020-12-18 MED ORDER — MORPHINE 100MG IN NS 100ML (1MG/ML) PREMIX INFUSION
0.0000 mg/h | INTRAVENOUS | Status: DC
  Administered 2020-12-18: 5 mg/h via INTRAVENOUS
  Filled 2020-12-18: qty 100

## 2020-12-18 MED ORDER — DIPHENHYDRAMINE HCL 50 MG/ML IJ SOLN
25.0000 mg | INTRAMUSCULAR | Status: DC | PRN
Start: 2020-12-18 — End: 2020-12-19

## 2020-12-18 MED ORDER — POLYVINYL ALCOHOL 1.4 % OP SOLN: 1.0000 [drp] | Freq: Four times a day (QID) | OPHTHALMIC | Status: DC | PRN

## 2020-12-18 MED ORDER — MIDAZOLAM BOLUS VIA INFUSION (WITHDRAWAL LIFE SUSTAINING TX)
2.0000 mg | INTRAVENOUS | Status: DC | PRN
  Filled 2020-12-18: qty 2

## 2020-12-18 MED ORDER — MORPHINE BOLUS VIA INFUSION
5.0000 mg | INTRAVENOUS | Status: DC | PRN
  Filled 2020-12-18: qty 5

## 2020-12-18 MED ORDER — HEPARIN (PORCINE) 25000 UT/250ML-% IV SOLN
1200.0000 [IU]/h | INTRAVENOUS | Status: DC
  Administered 2020-12-18: 1200 [IU]/h via INTRAVENOUS
  Filled 2020-12-18: qty 250

## 2020-12-18 MED ORDER — DEXTROSE 5 % IV SOLN: INTRAVENOUS | Status: DC

## 2020-12-18 MED ORDER — MIDAZOLAM 50MG/50ML (1MG/ML) PREMIX INFUSION: 0.0000 mg/h | INTRAVENOUS | Status: DC

## 2020-12-18 MED ORDER — SODIUM CHLORIDE 0.9% IV SOLUTION: Freq: Once | INTRAVENOUS | Status: AC

## 2020-12-19 LAB — BPAM RBC
Blood Product Expiration Date: 202202032359
Blood Product Expiration Date: 202202042359
ISSUE DATE / TIME: 202201040910
ISSUE DATE / TIME: 202201070801
Unit Type and Rh: 5100
Unit Type and Rh: 5100

## 2020-12-19 LAB — TYPE AND SCREEN
ABO/RH(D): O POS
Antibody Screen: NEGATIVE
Unit division: 0
Unit division: 0

## 2020-12-21 LAB — CULTURE, RESPIRATORY W GRAM STAIN

## 2020-12-22 LAB — CULTURE, BLOOD (ROUTINE X 2)
Culture: NO GROWTH
Culture: NO GROWTH
Special Requests: ADEQUATE
Special Requests: ADEQUATE

## 2021-01-12 NOTE — Progress Notes (Signed)
[place] for lovenox --> heparin drip Indication: acute pulmonary embolus  No Known Allergies  Patient Measurements: Height: 5\' 7"  (170.2 cm) Weight: 86 kg (189 lb 9.5 oz) IBW/kg (Calculated) : 61.6 Heparin Dosing Weight: 86 kg  Vital Signs: Temp: 96.1 F (35.6 C) (01/07 0826) Temp Source: Axillary (01/07 0826) BP: 131/69 (01/07 0900) Pulse Rate: 100 (01/07 0900)  Labs: Recent Labs    12/15/20 1438 12/15/20 1438 12/16/20 0443 12/17/20 0909 12/17/20 1500 12/19/2020 0420  HGB 8.2*  --  8.1*  --   --  6.0*  HCT 25.6*  --  25.8*  --   --  19.5*  PLT  --   --  192  --   --  167  CREATININE  --    < > 3.55* 3.87* 4.06* 2.37*   < > = values in this interval not displayed.    Estimated Creatinine Clearance: 25.6 mL/min (A) (by C-G formula based on SCr of 2.37 mg/dL (H)).   Assessment: Patient is a 70 y.o F known to pharmacy from previous anticoag. consults and current abx consults.  She's currently on full dose lovenox for acute PE.  CRRT started on 1/6.  Pharmacy consulted to transition to heparin drip while on CRRT.  Today, 12/19/20: - hgb down 6 --> 1 unit PRBC ordered, plts 167K - per pt's RN, some blood noted in her mouth - on CRRT - last dose of lovenox 80 mg SQ q24h given on 1/6 at 0900  Goal of Therapy:  Heparin level 0.3-0.7 units/ml Monitor platelets by anticoagulation protocol: Yes   Plan:  - d/c lovenox - start heparin drip at 1200 units/hr - check 8 hr heparin level - monitor closely for severity of bleeding in her mouth   Leialoha Hanna P 2020-12-19,9:34 AM

## 2021-01-12 NOTE — Procedures (Signed)
Admit: 11/16/2020 LOS: 11  36F AKI and severe COVID19 with VDRF  Current CRRT Prescription: Start Date: 12/17/20 Catheter: Temp HD ctah placed 1/6 by CCM BFR: 200 Pre Blood Pump: 500 2K DFR: 1500 2K Replacement Rate: 300 2K Goal UF: 141m/h net neg Anticoagulation: systemic heparin Clotting: none  S: Tol CRRT K and P Ok Time limited trial of CCRT, CCM has been working with family  O: 01/06 0701 - 01/07 0700 In: 3293.5 [I.V.:875.3; NG/GT:1735; IV Piggyback:683.2] Out: 2884 [Urine:1400]  Filed Weights   12/16/20 0448 12/17/20 0500 001/18/220458  Weight: 82.1 kg 85.3 kg 86 kg    Recent Labs  Lab 12/17/20 0909 12/17/20 1500 [id number]  NA 148* 147* 143  K 5.4* 5.7* 4.5  CL 109 107 106  CO2 23 23 25   GLUCOSE 182* 275* 256*  BUN 160* 171* 111*  CREATININE 3.87* 4.06* 2.37*  CALCIUM 9.5 9.2 8.8*  PHOS  --   --  4.2   Recent Labs  Lab 12/12/20 0500 12/13/20 0500 12/15/20 0351 12/15/20 1438 12/16/20 0443 [id number]  WBC 26.5* 24.3* 21.8*  --  20.2* 18.3*  NEUTROABS 21.9* 21.4*  --   --   --   --   HGB 8.1* 8.2* 6.9* 8.2* 8.1* 6.0*  HCT 25.9* 26.3* 21.7* 25.6* 25.8* 19.5*  MCV 112.1* 114.3* 114.2*  --  106.2* 107.7*  PLT 239 297 227  --  192 167    Scheduled Meds: . artificial tears  1 application Both Eyes QK4M . chlorhexidine gluconate (MEDLINE KIT)  15 mL Mouth Rinse BID  . Chlorhexidine Gluconate Cloth  6 each Topical Q0600  . feeding supplement (PROSource TF)  90 mL Per Tube BID  . folic acid  1 mg Per Tube Daily  . hydrALAZINE  10 mg Per Tube Q8H  . insulin aspart  0-20 Units Subcutaneous Q4H  . insulin aspart  10 Units Subcutaneous Q4H  . insulin glargine  5 Units Subcutaneous BID  . levETIRAcetam  500 mg Per Tube BID  . levothyroxine  125 mcg Per Tube Q0600  . mouth rinse  15 mL Mouth Rinse 10 times per day  . methylPREDNISolone (SOLU-MEDROL) injection  40 mg Intravenous Q24H  . multivitamin  15 mL Per Tube Daily  . pantoprazole sodium   40 mg Per Tube Q1200  . sodium chloride flush  10-40 mL Intracatheter Q12H  . thiamine  100 mg Per Tube Daily   Or  . thiamine  100 mg Intravenous Daily  . Thrombi-Pad  1 each Topical Once   Continuous Infusions: . sodium chloride 250 mL (12/17/20 1221)  . ceFEPime (MAXIPIME) IV Stopped (02022/01/180916)  . feeding supplement (VITAL 1.5 CAL) 1,000 mL (12/16/20 0451)  . fentaNYL infusion INTRAVENOUS 250 mcg/hr (001-18-20221300)  . heparin 1,200 Units/hr (001-18-221300)  . midazolam 0.5 mg/hr (02022-01-181300)  . prismasol BGK 2/2.5 dialysis solution 1,500 mL/hr at 001/18/20220416  . prismasol BGK 2/2.5 replacement solution 500 mL/hr at 12/17/20 2130  . prismasol BGK 2/2.5 replacement solution 300 mL/hr at 12/17/20 2130   PRN Meds:.acetaminophen (TYLENOL) oral liquid 160 mg/5 mL, artificial tears, atropine, fentaNYL, heparin sodium (porcine), heparin, labetalol, lip balm, midazolam, [DISCONTINUED] ondansetron **OR** ondansetron (ZOFRAN) IV, sodium chloride flush, vecuronium  ABG    Component Value Date/Time   PHART 7.171 (LL) 12/16/2020 0412   PCO2ART 54.9 (H) 12/16/2020 0412   PO2ART 59.4 (L) 12/16/2020 0412   HCO3 19.5 (L) 12/16/2020 0412   TCO2  25 04/01/2011 0137   ACIDBASEDEF 8.6 (H) 12/16/2020 0412   O2SAT 85.6 12/16/2020 0412    A/P  1. AKI, nonoligiuric:Most likely this is ATN;AIN seems less likely.  1. Started CRRT 1/6 for hyperkalemia and aztoemia 2. Cont current settings 2. Severe COVID-19 pneumonia/ARDS/hypoxic ventilator dependent respiratory failure, per CCM 3. Anemia, severe, transfusing per CCM 4. Pulmonary emboli, acute, on anticoagulation, per CCM 5. Question of neurosyphilis, weakly positive RPR and positive TPA had started on penicillin but held because of question of AIN; I do not think AIN is most likely here  Pearson Grippe, MD [employer]

## 2021-01-12 NOTE — Progress Notes (Addendum)
NAME:  [name], MRN:  [id number], DOB:  20-Jul-1951, LOS: 48 ADMISSION DATE:  12/16/20, CONSULTATION DATE: 12/06/2020 REFERRING MD: Dr. Posey Pronto, [facility] hospitalist, CHIEF COMPLAINT: Encephalopathy, hypoxia   Brief History:  70 year old smoker admitted 12/19 with acute hypoxic respiratory failure due to COVID 19 viral pneumonia. Initially required nonrebreather and high flow nasal cannula. She developed acute encephalopathy 12/26 and lactic acidosis requiring transfer to ICU.  Empirically being treated for neurosyphilis with a positive RPR and T. pallidum positive.  ICU course complicated by findings of segmental PE on anticoagulation, anemia with FOBT positive, & AKI.    Past Medical History:  Arthritis  Depression  GERD HTN Thyroid Disease  Tobacco Abuse  Significant Hospital Events:  12/19 admit with hypoxic respiratory failure in setting of COVID PNA 12/23 completed remdesivir 12/26 transferred to ICU, broad-spectrum antibiotics initiated.  Baricitinib stopped, cultures sent, eventually came back as gram-positive rods in 2 samples, CT of chest positive for pulmonary emboli systemic) started 12/27 intubated, required prone positioning 12/29 FiO2 and PEEP requirements improving, tapering steroids.  Still on pressors.  Escalating sliding scale for hyperglycemia. Started cont PCN G infusion as RPR was reactive and titer 1:1 w/ T Pallidum Abs Positive w/ delirium decided to treat as neurosyphilis (per ID suggestion).  12/30 BC finalized w/  CORYNEBACTERIUM PSEUDODIPHTHERIAE. Asked ID to formally see for ABX suggestions. Peep?FIO2 needs as well as CXR better. Renal fxn little worse  1/02 oxygen requirement worsened overnight 1/03 FiO2 70%, PEEP 12, Peak 35, Pplat 33, driving pressure 21  1/04 Changed to DNR  1/05 Negative balance with lasix, despite this rising needs in vent support  1/06 Sr cr elevated but making good urine, no indication for HD, pneumomediastinum on CXR. Cuff leak  on ETT 1/07: Creatinine continues to down trend, 2.37 on 1/7  Consults:  PCCM-12/26  Procedures:  ETT 12/27 >> RUE PICC 12/28 >>   Significant Diagnostic Tests:   Head CT 12/26 >> encephalomalacia related to remote infarcts in left parietal occipital, right occipital and left temporal lobe -new compared to 2014.  CT angiogram chest 12/26 >> acute pulmonary emboli within segmental branches of left lower lobe, small clot burden, no evidence of RV strain  CT angiogram chest 12/21 >> no evidence of pulmonary embolus, diffuse interlobular septal thickening with GGO especially basis  Micro Data:  BCID 12/19 >> staph species detected  BCx2 12/19 >> staph hominis (aerobic bottle only) MRSA PCR 12/26 >> negative  BCx2 12/26 >> corynebacterium pseudodiphtheriae  RPR 12/26 >> reactive  T.Pallidium 12/26 >> reactive  BCx2 12/30 >> negative  Tracheal Aspirate 1/6 >>  BCx2 1/6 >>   Antimicrobials:  Zosyn 12/26 >> 12/30 Vancomycin 12/26 >> 12/30 Cont PCN G infusion 12/29 >> x14-day treatment   Cefepime 1/6 >>   Interim History / Subjective:  Tmax 101.6 , Hypothermia also to 93.5, WBC 18.3 Vent - PEEP 14, FiO2 100%  Glucose range 147-267 I/O UOP 1.4L, +409 in last 24 hours , Net + 10.3 L Foley removed HGB drop to 6 overnight Just completed transfusion with 1 unit PRBC  K 4.6 CRP 37.1 CVVHD last 24 hours, pulling 50-100 cc/ hr  Off Desaturations over night despite FiO2 of 100% and pulling fluid Sats currently 81%  Objective   Blood pressure 139/72, pulse (!) 101, temperature (!) 97.3 F (36.3 C), temperature source Axillary, resp. rate (!) 34, height 5\' 7"  (1.702 m), weight 86 kg, SpO2 (!) 82 %. CVP:  [6 mmHg-12 mmHg] 6  mmHg  Vent Mode: PRVC FiO2 (%):  [100 %] 100 % Set Rate:  [34 bmp] 34 bmp Vt Set:  [490 mL] 490 mL PEEP:  [14 cmH20] 14 cmH20 Plateau Pressure:  [19 cmH20-36 cmH20] 36 cmH20   Intake/Output Summary (Last 24 hours) at 13-Jan-2021 1017 Last data filed at  Jan 13, 2021 1000 Gross per 24 hour  Intake 3454.41 ml  Output 3304 ml  Net 150.41 ml   Filed Weights   12/16/20 0448 12/17/20 0500 2021/01/13 0458  Weight: 82.1 kg 85.3 kg 86 kg    Examination: General: adult female lying in bed in NAD sedated on vent , [employer]ger HEENT: MM pink/moist, ETT, anicteric, mild scleral edema  Neuro: sedate  CV: s1s2 RRR, no m/r/g PULM:Bilateral chest excursion non-labored on vent, lungs bilaterally with rhonchi, yellow secretions from ballard  GI: soft, bsx4 active  Extremities: warm/dry, BUE dependent edema, warm and dry  Skin: no rashes or lesions    Resolved Hospital Problem list   Thrombocytopenia  Septic shock  Assessment & Plan:   Acute hypoxemic respiratory failure secondary to Covid PNA ARDS Pulmonary Embolism Worsening SQ air 1/7 -low Vt ventilation 4-8cc/kg -goal plateau pressure <30, driving pressure [id number] cm H2O -target PaO2 55-65, titrate PEEP/FiO2 per ARDS protocol  -if P/F ratio <150, consider prone therapy for 16 hours per day -goal CVP <4, diuresis as necessary -VAP prevention measures  -follow intermittent CXR  -solumdedrol 40 mg QD, taper to off pending O2 response -transition to heparin per pharmacy  and stop Lovenox as HGB drop and required transfusion - ETT changed out 12/17/2020 to address cuff  Leak. - CXR  now  Sedation Needs while on Mechanical Ventilation  -PAD protocol, RASS goal -2 to -3 with vent synchrony  -minimize benzo as she is uremic / will be slow to clear  -PRN NMB for vent synchrony  -versed / fentanyl gtt   Presumptive diagnosis of Neurosyphilis -Continue PCN, D 9 /14  -appreciate ID assistance   Fever  New 1/6 -assess tracheal aspirate, blood cultures>> follow  -add cefepime   Possible Seizures EEG negative for seizures, + for moderate diffuse encephalopathy, nonspecific etiology Seen on admit  - continue keppra   Acute Kidney Injury CVVHD initiated 1/6 x 48 hour  trial Hyperkalemia Hypernatremia >> improving -Trend BMP / urinary output -Replace electrolytes as indicated -Avoid nephrotoxic agents, ensure adequate renal perfusion - Goal is to pull 50-100 cc per hour -appreciate Nephrology input  -add free water 100 ml Q4 (likely from lokelma / kayexalate Na load)  Hx CVA with R sided Weakness  CT showing evidence of encephalomalacia in the frontoparietal region -supportive care  -PT efforts when able   Leukocytosis   -monitor WBC trend  Anemia  FOBT positive, transfused 1/4 , required transfusion again 1/7 for HGB 6 -trend CBC  -transfuse for Hgb <7% or evidence of active bleeding  -will need GI evaluation at some point  Hypothyroidism -synthroid   Hyperglycemia -SSI, resistant scale  -lantus 5 units BID  -TF 10 units Q4   Moderate Protein Calorie Malnutrition -TF per Nutrition   Best practice (evaluated daily)  Diet: TF Pain/Anxiety/Delirium protocol (if indicated): Fentanyl and versed, intermittent paralytics VAP protocol (if indicated):ordered DVT prophylaxis: Lovenox GI prophylaxis: PPI  Glucose control: SSI Mobility: Bedrest Disposition: ICU  Goals of Care:  Last date of multidisciplinary goals of care discussion: [name], daughter, called 1/6 for update.  Spoke with patient's niece as well [name], she is an Therapist, sports at [employer]).  Reviewed advanced support and limited progress over past 48 hours.  Asked family to consider acute illness and what the patient would want in the current situation.  Code Status: DNR, continue current level of support for now Family with ongoing discussion re:  of goals of care, and what patient would want .   Daughter and Niece have been updated in full 12/26/2020, regarding patient condition, and probability of meaningful recovery. We discussed that the patient has not improved after 18 hours of CVVHD in regard to her oxygenation. Sats have remained < 80 for the last 6-8 hours. We discussed the long  term effects of hypoxemia on the brain, and how this translates to meaningful recovery.   Per the decision to continue CVVHD  for 48 hours to see if there is any significant improvement, Dr. Melvyn Novas should address 1/8 the feasibility of continuing therapy if there continues to be no significant improvement.       Critical Care Time: 45 minutes  Magdalen Spatz, MSN, AGACNP-BC [place] for personal pager PCCM on call pager [phone] 01/09/2021, 10:17 AM   Please see [email or web address] for pager details.

## 2021-01-12 NOTE — Progress Notes (Signed)
Pt extubated per md order. Placed pt on 2L. Family and RN at bedside

## 2021-01-12 NOTE — Progress Notes (Signed)
Family Rodena Piety and [place])  called the unit to speak with me. After the most recent update, they have decided to move forward with Comfort Care Measures. Alexis, the patient's nurse will evaluate the number of family members who can be present in the room for the terminal extubation per today's hospital policy in light of the Covid resurgence.  I will place the comfort care orders now, and the nursing staff will implement when the family are able to be at the patient's side. Plan is to start morphine gtt, ensure comfort, then terminally extubate. Dr. Silas Flood is aware of the plan of care.   Magdalen Spatz, MSN, AGACNP-BC [place] for personal pager PCCM on call pager [phone] Jan 05, 2021 2:24 PM

## 2021-01-12 NOTE — Progress Notes (Signed)
Chaplain responded to request from day chaplain for family support.  Various family members were able to come and switch out.  Daughter, son in law and 2 grandchildren were present for extubation and passing of the patient.  Chaplain offered support, words of comfort, reflective listening and hospitality.  Chaplain will give family a patient placement card.   Chaplain Katherene Ponto, Mdiv.    12/25/20 1919  Clinical Encounter Type  Visited With Family;Health care provider  Visit Type Critical Care;Patient actively dying;Death  Referral From Chaplain  Consult/Referral To Chaplain  Spiritual Encounters  Spiritual Needs Grief support;Emotional  Stress Factors  Family Stress Factors Loss;Exhausted

## 2021-01-12 NOTE — Death Summary Note (Signed)
DEATH SUMMARY   Patient Details  Name: [name] MRN: [id number] DOB: 1951-08-25  Admission/Discharge Information   Admit Date:  Dec 25, 2020  Date of Death: Date of Death: 2021/01/13  Time of Death:    Length of Stay: 04/26/2023  Referring Physician: Rogers Blocker, MD   Reason(s) for Hospitalization  COVID ARDS Hypoxemic Respiratory Failure  Diagnoses  Preliminary cause of death: Hypoxemic Respiratory Failure due ARDS from CoVid Pna Secondary Diagnoses (including complications and co-morbidities):  Principal Problem:   Acute respiratory failure due to COVID-19 [facility]) Active Problems:   Hypothyroidism   HYPERTENSION, BENIGN ESSENTIAL   Cigarette smoker   Acute hypoxemic respiratory failure due to COVID-19 [facility])   Acute respiratory distress syndrome (ARDS) due to COVID-19 virus [facility])   Brief Hospital Course (including significant findings, care, treatment, and services provided and events leading to death)  [name] is a 70 y.o. year old female who was admitted with [place]. Progressive hypoxemia throughout stay. Eventually intubated. Progressive hypoxemia requiring maximum support. Cr worsened. Responded well to lasix but oxygenation worsened. Started CRRT 1/6 but O2 worsened. Family decided to withdraw care 13-Jan-2023. She passed in presence of family.    Pertinent Labs and Studies  Significant Diagnostic Studies DG Abd 1 View  Result Date: 12/07/2020 CLINICAL DATA:  OG tube placement EXAM: ABDOMEN - 1 VIEW COMPARISON:  None. FINDINGS: The bowel gas pattern is normal. Tip the NG tube is seen within the mid body of the stomach. No radio-opaque calculi or other significant radiographic abnormality are seen. IMPRESSION: Tip of the OG tube projecting in the mid body of the stomach. Electronically Signed   By: Prudencio Pair M.D.   On: 12/07/2020 22:15   CT HEAD WO CONTRAST  Result Date: 12/06/2020 CLINICAL DATA:  Altered mental status EXAM: CT HEAD WITHOUT CONTRAST TECHNIQUE:  Contiguous axial images were obtained from the base of the skull through the vertex without intravenous contrast. COMPARISON:  09/06/2013 FINDINGS: Brain: No evidence of acute infarction, hemorrhage, hydrocephalus, extra-axial collection or mass lesion/mass effect. 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 These are all new from the most recent prior exam of 2014. Scattered low-density changes within the periventricular and subcortical white matter compatible with chronic microvascular ischemic change. Mild diffuse cerebral volume loss. Vascular: Atherosclerotic calcifications involving the large vessels of the skull base. No unexpected hyperdense vessel. Skull: Normal. Negative for fracture or focal lesion. Sinuses/Orbits: No acute finding. Other: None. IMPRESSION: 1. No acute intracranial findings. 2. Chronic microvascular ischemic change and cerebral volume loss. 3. 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 These are all new from the most recent prior exam of 2014. Electronically Signed   By: Davina Poke D.O.   On: 12/06/2020 15:54   CT ANGIO CHEST PE W OR WO CONTRAST  Result Date: 12/06/2020 CLINICAL DATA:  Concern for pulmonary embolism.  COVID positive. EXAM: CT ANGIOGRAPHY CHEST WITH CONTRAST TECHNIQUE: Multidetector CT imaging of the chest was performed using the standard protocol during bolus administration of intravenous contrast. Multiplanar CT image reconstructions and MIPs were obtained to evaluate the vascular anatomy. CONTRAST:  146mL OMNIPAQUE IOHEXOL 350 MG/ML SOLN COMPARISON:  None. FINDINGS: Cardiovascular: Small tubular filling defects within the subsegmental branches of the LEFT lower lobe pulmonary arteries (image 57/5). These filling defects are partially occlusive. No additional filling defects are present within the pulmonary arteries.  Overall clot burden is mild.  No evidence of RIGHT ventricular strain. Mediastinum/Nodes: Mild RIGHT hilar adenopathy again noted. No pericardial fluid. Trachea normal. Esophagus normal Lungs/Pleura: There is diffuse interlobular septal thickening again noted. Increased ground-glass consolidation in the RIGHT lower lobe (image 77/7, for example). Mild increased ground-glass consolidation in the inferior aspect the RIGHT upper lobe and to the fissure (image 70/7). Bronchiectasis at the lung bases is similar. Upper Abdomen: Limited view of the liver, kidneys, pancreas are unremarkable. Normal adrenal glands. Musculoskeletal: No aggressive osseous lesion. Review of the MIP images confirms the above findings. IMPRESSION: 1. Acute pulmonary emboli within posterior segmental branches of the LEFT lobe pulmonary artery. Overall clot burden is mild. 2. Increased ground-glass consolidation in the RIGHT lower lobe and inferior aspect the RIGHT upper lobe. 3. No change in diffuse interlobular septal thickening seen on CT 12/01/2020. Findings conveyed toPRANAV PATEL on 12/06/2020  at16:03. Electronically Signed   By: Suzy Bouchard M.D.   On: 12/06/2020 16:05   CT ANGIO CHEST PE W OR WO CONTRAST  Result Date: 12/01/2020 CLINICAL DATA:  COVID-19 diagnosed 11/24/2020, body aches, fever, cough EXAM: CT ANGIOGRAPHY CHEST WITH CONTRAST TECHNIQUE: Multidetector CT imaging of the chest was performed using the standard protocol during bolus administration of intravenous contrast. Multiplanar CT image reconstructions and MIPs were obtained to evaluate the vascular anatomy. CONTRAST:  166mL OMNIPAQUE IOHEXOL 350 MG/ML SOLN COMPARISON:  2020/12/02 FINDINGS: Cardiovascular: This is a technically adequate evaluation of the pulmonary vasculature. No filling defects or pulmonary emboli. The heart is unremarkable without pericardial effusion. Normal caliber of the thoracic aorta. Mild atherosclerosis. Mediastinum/Nodes: Hilar and right  paratracheal lymphadenopathy is likely reactive. Thyroid, trachea, and esophagus is unremarkable. Lungs/Pleura: There is diffuse interlobular septal thickening throughout the lungs, with areas of ground-glass airspace disease primarily at the lung bases. Underlying emphysema is again noted. No effusion or pneumothorax. Central airways are widely patent. Upper Abdomen: No acute abnormality. Musculoskeletal: There are no acute or destructive bony lesions. Reconstructed images demonstrate no additional findings. Review of the MIP images confirms the above findings. IMPRESSION: 1. No evidence of pulmonary embolus. 2. Diffuse interlobular septal thickening throughout the lungs, with ground-glass consolidation primarily at the bases. Findings are most consistent with atypical viral infection, including COVID 19. 3. Likely reactive mediastinal and hilar adenopathy. 4. Aortic Atherosclerosis (ICD10-I70.0) and Emphysema (ICD10-J43.9). Electronically Signed   By: Randa Ngo M.D.   On: 12/01/2020 16:23   US RENAL  Result Date: 12/14/2020 CLINICAL DATA:  Renal failure EXAM: RENAL / URINARY TRACT ULTRASOUND COMPLETE COMPARISON:  06/06/2018 FINDINGS: Right Kidney: Renal measurements: 11.3 x 5.6 x 5.7 cm = volume: 188 mL. Increased echogenicity of the renal parenchyma. Mild fullness of the renal collecting system. No focal renal lesion. Small amount of perinephric fluid. Left Kidney: Renal measurements: 11.4 x 6.3 x 5.5 cm = volume: 206 mL. Increased echogenicity of the renal parenchyma. No mass or hydronephrosis visualized. Bladder: Foley catheter present in the bladder. Moderate amount urine in the bladder. Question catheter malfunction Other: None. IMPRESSION: Foley catheter in the bladder, but the bladder is distended with urine. This suggests catheter malfunction. Mild fullness of the right renal collecting system, possibly subsequent to the distended bladder. No renal parenchymal stone disease or mass. Kidneys are  somewhat echogenic. Small amount of perinephric fluid. Kidneys are slightly larger than were seen on the previous study. This suggests acute nephritis. Electronically Signed   By: Nelson Chimes M.D.   On: 12/14/2020 18:59   DG CHEST PORT 1 VIEW  Result  Date: January 04, 2021 CLINICAL DATA:  COVID-19 pneumonia EXAM: PORTABLE CHEST 1 VIEW COMPARISON:  12/17/2020 FINDINGS: ET tube distal tip terminates 4.6 cm above the carina. Enteric tube courses below the diaphragm with distal tip beyond the inferior margin of the film. Right IJ central venous catheter terminates at the level of the mid SVC. Right-sided PICC line in stable positioning. Mixed patchy and interstitial opacities throughout both lungs, most pronounced within the lung bases. Suspect small bilateral pleural effusions. Overall, little interval change from prior. No pneumothorax identified. Extensive chest wall subcutaneous emphysema. IMPRESSION: 1. No significant interval change. Mixed patchy and interstitial opacities throughout both lungs, most pronounced within the lung bases. 2. Extensive chest wall subcutaneous emphysema. 3. Stable support apparatus. Electronically Signed   By: Davina Poke D.O.   On: 01/04/21 13:44   DG CHEST PORT 1 VIEW  Result Date: 12/17/2020 CLINICAL DATA:  70 year old female status post intubation. EXAM: PORTABLE CHEST 1 VIEW COMPARISON:  Chest x-ray 12/17/2020. FINDINGS: An endotracheal tube is in place with tip 5.5 cm above the carina. Right IJ central venous catheters with tips terminating in the proximal in mid superior vena cava. Lung volumes appear normal. Widespread patchy ill-defined opacities and areas of interstitial prominence are noted throughout the mid to lower lungs bilaterally, concerning for multilobar pneumonia. Possible small bilateral pleural effusions. Pulmonary vasculature is obscured. No pneumothorax. Aortic atherosclerosis. Heart size is normal. Upper mediastinal contours are within normal limits.  Extensive subcutaneous emphysema throughout the chest wall and right cervical region. IMPRESSION: 1. Support apparatus, as above. 2. Severe multilobar bilateral pneumonia compatible with known COVID infection. 3. Possible small bilateral pleural effusions. 4. Aortic atherosclerosis. 5. Extensive subcutaneous emphysema redemonstrated. Electronically Signed   By: Vinnie Langton M.D.   On: 12/17/2020 19:11   DG CHEST PORT 1 VIEW  Result Date: 12/17/2020 CLINICAL DATA:  COVID.  Respiratory distress. EXAM: PORTABLE CHEST 1 VIEW COMPARISON:  12/16/2020. FINDINGS: Endotracheal tube, NG tube, right PICC line in stable position. Pneumomediastinum/pneumopericardium again noted. Heart size stable. Low lung volumes. Diffuse bilateral pulmonary infiltrates are again noted without interim change. Tiny bilateral pleural effusions cannot be excluded. No pneumothorax. Diffuse chest wall subcutaneous emphysema again noted. IMPRESSION: 1. Lines and tubes in stable position. 2. Pneumomediastinum/pneumopericardium again noted. Diffuse chest wall subcutaneous emphysema again noted. No pneumothorax noted. 3. Low lung volumes. Diffuse bilateral pulmonary infiltrates again noted without interim change. Electronically Signed   By: Marcello Moores  Register   On: 12/17/2020 05:18   DG CHEST PORT 1 VIEW  Result Date: 12/16/2020 CLINICAL DATA:  Subcutaneous crepitus EXAM: PORTABLE CHEST 1 VIEW COMPARISON:  12/15/2020 FINDINGS: Single frontal view of the chest demonstrates endotracheal tube, enteric catheter, and right-sided PICC unchanged. Since the previous exam, extensive subcutaneous emphysema has developed. Gas lucencies are seen outlining the cardiac silhouette and mediastinal contours consistent with pneumomediastinum. Findings are consistent with barotrauma. No evidence of pneumothorax. Diffuse interstitial prominence with bibasilar ground-glass consolidation unchanged. Small effusions are suspected. No acute bony abnormalities.  IMPRESSION: 1. Interval development of pneumomediastinum and diffuse subcutaneous emphysema, consistent with barotrauma given intubation. No evidence of pneumothorax. 2. Stable interstitial and ground-glass opacities, consistent with known history of COVID-19. 3. Stable small bilateral pleural effusions. These results were called by telephone at the time of interpretation on 12/16/2020 at 8:55 pm to patient nurse, Pamala Hurry, who verbally acknowledged these results. Electronically Signed   By: Randa Ngo M.D.   On: 12/16/2020 20:55   DG CHEST PORT 1 VIEW  Result Date: 12/15/2020 CLINICAL  DATA:  ARDS.  COVID. EXAM: PORTABLE CHEST 1 VIEW COMPARISON:  12/13/2020. FINDINGS: Endotracheal tube, NG tube, right PICC line in stable position. Heart size normal. Diffuse bilateral interstitial infiltrates again noted. Progressive bibasilar atelectasis/infiltrates. Small left pleural effusion cannot be excluded. No pneumothorax. Thoracic spine scoliosis concave left. Degenerative change thoracic spine. IMPRESSION: 1. Lines and tubes in stable position. 2. Diffuse bilateral interstitial infiltrates again noted. Progressive bibasilar atelectasis/infiltrates. Small left pleural effusion cannot be excluded. Electronically Signed   By: Marcello Moores  Register   On: 12/15/2020 05:40   DG CHEST PORT 1 VIEW  Result Date: 12/13/2020 CLINICAL DATA:  Respiratory failure, COVID EXAM: PORTABLE CHEST 1 VIEW COMPARISON:  12/10/2020 FINDINGS: Support devices are stable. Heart is normal size. Diffuse interstitial prominence throughout the lungs. More confluent airspace opacities noted in both lung bases. No visible effusions or pneumothorax. No acute bony abnormality. IMPRESSION: Stable diffuse interstitial opacities and bibasilar opacities. Electronically Signed   By: Rolm Baptise M.D.   On: 12/13/2020 11:18   DG Chest Port 1 View  Result Date: 12/10/2020 CLINICAL DATA:  COVID pneumonia, ARDS EXAM: PORTABLE CHEST 1 VIEW COMPARISON:   12/07/2020 FINDINGS: Endotracheal tube is seen 4.7 cm above the carina. Right upper extremity PICC line tip noted at the superior cavoatrial junction. Nasogastric tube extends into the upper abdomen beyond the margin of the examination. Pulmonary insufflation has improved. Superimposed diffuse pulmonary infiltrate, more focal at the lung bases bilaterally, appears stable. No pneumothorax. Tiny left pleural effusion. Cardiac size within normal limits. IMPRESSION: Stable support lines and tubes. Improved pulmonary insufflation. Stable diffuse pulmonary infiltrate. Electronically Signed   By: Fidela Salisbury MD   On: 12/10/2020 05:57   DG CHEST PORT 1 VIEW  Result Date: 12/07/2020 CLINICAL DATA:  Intubation EXAM: PORTABLE CHEST 1 VIEW COMPARISON:  12/04/2020 FINDINGS: Slight worsening of bilateral basilar predominant airspace opacities. Endotracheal tube tip is at the level of the clavicular heads. Esophageal catheter side port projects within the stomach. IMPRESSION: 1. Endotracheal tube tip at the level of the clavicular heads. 2. Worsening bilateral basilar predominant airspace opacities. Electronically Signed   By: Ulyses Jarred M.D.   On: 12/07/2020 22:05   DG CHEST PORT 1 VIEW  Result Date: 12/04/2020 CLINICAL DATA:  Shortness of breath EXAM: PORTABLE CHEST 1 VIEW COMPARISON:  11/17/2020, CT from 12/01/2020 FINDINGS: Cardiac shadow is stable. Mild bibasilar atelectatic changes are noted. Diffuse hazy opacity is noted throughout both lung similar to that seen on prior CT. No new focal abnormality is noted. IMPRESSION: Diffuse hazy opacity bilaterally consistent with the given clinical history of COVID-19 positivity. Some mild increased atelectatic changes in the left base are noted. Electronically Signed   By: Inez Catalina M.D.   On: 12/04/2020 08:31   DG Chest Port 1 View  Result Date: 12/11/2020 CLINICAL DATA:  Shortness of breath. EXAM: PORTABLE CHEST 1 VIEW COMPARISON:  11/26/2020 FINDINGS: There  are progressive hazy airspace opacities bilaterally with prominent interstitial lung markings. There is no pneumothorax. The heart size is stable. There is developing consolidation at the lung bases, left greater than right. IMPRESSION: Progressive hazy bilateral airspace opacities consistent with the patient's history of COVID-19 pneumonia. Electronically Signed   By: Constance Holster M.D.   On: 11/15/2020 20:36   DG Chest Portable 1 View  Result Date: 11/26/2020 CLINICAL DATA:  70 year old female with shortness of breath. Positive COVID-19. EXAM: PORTABLE CHEST 1 VIEW COMPARISON:  Chest radiograph dated 01/09/2018. FINDINGS: Mild diffuse interstitial coarsening and chronic bronchitic  changes. Faint bibasilar densities, likely atelectasis. Atypical infection is less likely. No focal consolidation, pleural effusion, pneumothorax. There is mild cardiomegaly. Atherosclerotic calcification of the aortic arch. No acute osseous pathology. IMPRESSION: 1. No acute cardiopulmonary process. 2. Mild cardiomegaly. Electronically Signed   By: Anner Crete M.D.   On: 11/26/2020 19:07   EEG adult  Result Date: 12/07/2020 Lora Havens, MD     12/07/2020  3:15 PM Patient Name: [name] MRN: [id number] Epilepsy Attending: Lora Havens Referring Physician/Provider: Dr Berle Mull Date: 12/07/2020 Duration: 23.05 mins Patient history: 69yo  With prior L PO encephalomalacia now with ams. EEG to evaluate for seizure Level of alertness: Awake AEDs during EEG study: LEv Technical aspects: This EEG study was done with scalp electrodes positioned according to the 10-20 International system of electrode placement. Electrical activity was acquired at a sampling rate of 500Hz  and reviewed with a high frequency filter of 70Hz  and a low frequency filter of 1Hz . EEG data were recorded continuously and digitally stored. Description: No clear posterior dominant rhythm was seen.  EEG showed continuous generalized  mixed frequencies with predominatly 6-8hz  theta-alpha activity as well as intermittent 2-3Hz  delta slowing.  Hyperventilation and photic stimulation were not performed.   ABNORMALITY -Continuous slow, generalized IMPRESSION: This study is suggestive of moderate diffuse encephalopathy, nonspecific etiology. No seizures or epileptiform discharges were seen throughout the recording. Priyanka O Yadav   VAS [place] LOWER EXTREMITY VENOUS (DVT)  Result Date: 12/02/2020  Lower Venous DVT Study Indications: Elevated ddimer.  Comparison Study: no prior Performing Technologist: Abram Sander RVS  Examination Guidelines: A complete evaluation includes B-mode imaging, spectral Doppler, color Doppler, and power Doppler as needed of all accessible portions of each vessel. Bilateral testing is considered an integral part of a complete examination. Limited examinations for reoccurring indications may be performed as noted. The reflux portion of the exam is performed with the patient in reverse Trendelenburg.  +---------+---------------+---------+-----------+----------+--------------+ RIGHT    CompressibilityPhasicitySpontaneityPropertiesThrombus Aging +---------+---------------+---------+-----------+----------+--------------+ CFV      Full           Yes      Yes                                 +---------+---------------+---------+-----------+----------+--------------+ SFJ      Full                                                        +---------+---------------+---------+-----------+----------+--------------+ FV Prox  Full                                                        +---------+---------------+---------+-----------+----------+--------------+ FV Mid   Full                                                        +---------+---------------+---------+-----------+----------+--------------+ FV DistalFull                                                         +---------+---------------+---------+-----------+----------+--------------+  PFV      Full                                                        +---------+---------------+---------+-----------+----------+--------------+ POP      Full           Yes      Yes                                 +---------+---------------+---------+-----------+----------+--------------+ PTV      Full                                                        +---------+---------------+---------+-----------+----------+--------------+ PERO     Full                                                        +---------+---------------+---------+-----------+----------+--------------+   +---------+---------------+---------+-----------+----------+--------------+ LEFT     CompressibilityPhasicitySpontaneityPropertiesThrombus Aging +---------+---------------+---------+-----------+----------+--------------+ CFV      Full           Yes      Yes                                 +---------+---------------+---------+-----------+----------+--------------+ SFJ      Full                                                        +---------+---------------+---------+-----------+----------+--------------+ FV Prox  Full                                                        +---------+---------------+---------+-----------+----------+--------------+ FV Mid   Full                                                        +---------+---------------+---------+-----------+----------+--------------+ FV DistalFull                                                        +---------+---------------+---------+-----------+----------+--------------+ PFV      Full                                                        +---------+---------------+---------+-----------+----------+--------------+  POP      Full           Yes      Yes                                  +---------+---------------+---------+-----------+----------+--------------+ PTV      Full                                                        +---------+---------------+---------+-----------+----------+--------------+ PERO     Full                                                        +---------+---------------+---------+-----------+----------+--------------+     Summary: BILATERAL: - No evidence of deep vein thrombosis seen in the lower extremities, bilaterally. - No evidence of superficial venous thrombosis in the lower extremities, bilaterally. -No evidence of popliteal cyst, bilaterally.   *See table(s) above for measurements and observations. Electronically signed by Curt Jews MD on 12/02/2020 at 8:05:12 PM.    Final    [place] EKG SITE RITE  Result Date: 12/08/2020 If Site Rite image not attached, placement could not be confirmed due to current cardiac rhythm.   Microbiology Recent Results (from the past 240 hour(s))  Culture, blood (Routine X 2) w Reflex to ID Panel     Status: None   Collection Time: 12/10/20  5:01 PM   Specimen: BLOOD  Result Value Ref Range Status   Specimen Description   Final    BLOOD LEFT ANTECUBITAL Performed at [facility] [address] [place], [place] [address]    Special Requests   Final    BOTTLES DRAWN AEROBIC ONLY Blood Culture adequate volume Performed at [facility] [address] [place], [place] [address]    Culture   Final    NO GROWTH 5 DAYS Performed at [facility], [place] [address], [place], [place] [address]    Report Status 12/15/2020 FINAL  Final  Culture, blood (Routine X 2) w Reflex to ID Panel     Status: None   Collection Time: 12/10/20  5:01 PM   Specimen: BLOOD LEFT FOREARM  Result Value Ref Range Status   Specimen Description   Final    BLOOD LEFT FOREARM Performed at [facility] [address] [place], [place] [address]    Special Requests    Final    BOTTLES DRAWN AEROBIC ONLY Blood Culture adequate volume Performed at [facility] [address], [place], [place] [address]    Culture   Final    NO GROWTH 5 DAYS Performed at [facility], [place] [address], [place], [place] [address]    Report Status 12/15/2020 FINAL  Final  Culture, respiratory (non-expectorated)     Status: None (Preliminary result)   Collection Time: 12/17/20  3:19 PM   Specimen: Tracheal Aspirate; Respiratory  Result Value Ref Range Status   Specimen Description   Final    TRACHEAL ASPIRATE Performed at [facility] [address], [place], [place] [address]  Special Requests   Final    NONE Performed at [facility], [place] [address] [place], [place] [address]    Gram Stain   Final    FEW WBC PRESENT, PREDOMINANTLY PMN ABUNDANT GRAM NEGATIVE RODS MODERATE GRAM POSITIVE RODS    Culture   Final    TOO YOUNG TO READ Performed at [facility], [place] [address] [place], [place] [address]    Report Status PENDING  Incomplete    Lab Basic Metabolic Panel: Recent Labs  Lab 12/14/20 0500 12/14/20 1400 12/15/20 0351 12/16/20 0443 12/17/20 0549 12/17/20 0909 12/17/20 1500 12/30/2020 0420 12/14/2020 1511  NA  --    < > 137 144  --  148* 147* 143 142  K  --    < > 5.3* 5.3*  --  5.4* 5.7* 4.5 5.2*  CL  --    < > 102 105  --  109 107 106 101  CO2  --    < > 17* 19*  --  23 23 25 24   GLUCOSE  --    < > 177* 196*  --  182* 275* 256* 219*  BUN  --    < > 108* 157*  --  160* 171* 111* 95*  CREATININE 3.10*   < > 3.98* 3.55*  --  3.87* 4.06* 2.37* 2.18*  CALCIUM  --    < > 9.6 9.6  --  9.5 9.2 8.8* 9.0  MG 2.3  --  2.5* 2.4 2.4  --   --  2.3  --   PHOS  --   --   --   --   --   --   --  4.2 6.0*   < > = values in this interval not displayed.   Liver Function Tests: Recent Labs  Lab 12/12/20 0500 12/13/20 0500 12/15/20 0351 12/17/20 0909 01/10/2021 0420 01/04/2021 1511  AST 25  24 21 27   --   --   ALT 24 20 14 17   --   --   ALKPHOS 82 84 91 108  --   --   BILITOT 0.4 0.5 0.9 1.0  --   --   PROT 6.5 6.8 6.8 6.7  --   --   ALBUMIN 2.1* 2.0* 1.7* 1.5* 1.6* 1.9*   No results for input(s): LIPASE, AMYLASE in the last 168 hours. No results for input(s): AMMONIA in the last 168 hours. CBC: Recent Labs  Lab 12/12/20 0500 12/13/20 0500 12/15/20 0351 12/15/20 1438 12/16/20 0443 12/25/2020 0420 12/28/2020 1300  WBC 26.5* 24.3* 21.8*  --  20.2* 18.3*  --   NEUTROABS 21.9* 21.4*  --   --   --   --   --   HGB 8.1* 8.2* 6.9* 8.2* 8.1* 6.0* 8.4*  HCT 25.9* 26.3* 21.7* 25.6* 25.8* 19.5* 27.1*  MCV 112.1* 114.3* 114.2*  --  106.2* 107.7*  --   PLT 239 297 227  --  192 167  --    Cardiac Enzymes: No results for input(s): CKTOTAL, CKMB, CKMBINDEX, TROPONINI in the last 168 hours. Sepsis Labs: Recent Labs  Lab 12/13/20 0500 12/15/20 0351 12/16/20 0443 01/10/2021 0420  WBC 24.3* 21.8* 20.2* 18.3*    Procedures/Operations  As per EMR   Bonna Gains Matilyn Fehrman 12/27/2020, 6:59 PM

## 2021-01-12 NOTE — Progress Notes (Signed)
eLink Physician-Brief Progress Note Patient Name: TOWANA STENGLEIN DOB: 1951-04-14 MRN: [id number]   Date of Service  01/01/2021  HPI/Events of Note  Anemia - Hgb = 6.0.  eICU Interventions  Will transfuse 1 unit PRBC now.      Intervention Category Major Interventions: Other:  Lysle Dingwall 01/09/2021, 5:51 AM

## 2021-01-12 NOTE — Progress Notes (Signed)
CRITICAL VALUE ALERT  Critical Value:  hgb 6.0 Date & Time Notied:  12/19/19  Provider Notified: elink  Orders Received/Actions taken: awaiting orders

## 2021-01-12 NOTE — Progress Notes (Signed)
eLink Physician-Brief Progress Note Patient Name: [name] DOB: [birth year] MRN: [id number]   Date of Service  12/12/2020  HPI/Events of Note  Hypothermia - Temp = 93.5 F. Request for [employer].   eICU Interventions  Will order [employer].     Intervention Category Major Interventions: Other:  Lysle Dingwall 01/11/2021, 5:06 AM

## 2021-01-12 NOTE — Progress Notes (Signed)
105 mL morphine and 38 mL versed wasted by Rayfield Citizen RN and Darol Destine RN

## 2021-01-12 DEATH — deceased

## 2021-06-17 IMAGING — DX DG CHEST 1V PORT
1 series · 2 of 2 positions shown · non-contrast
Comparison: Chest x-ray 12/17/2020.

CLINICAL DATA: 69-year-old female status post intubation.

EXAM:
PORTABLE CHEST 1 VIEW

[Series 1: chest ap · 0.14mm/px · 2 of 2 slices shown]
[im 1/2]
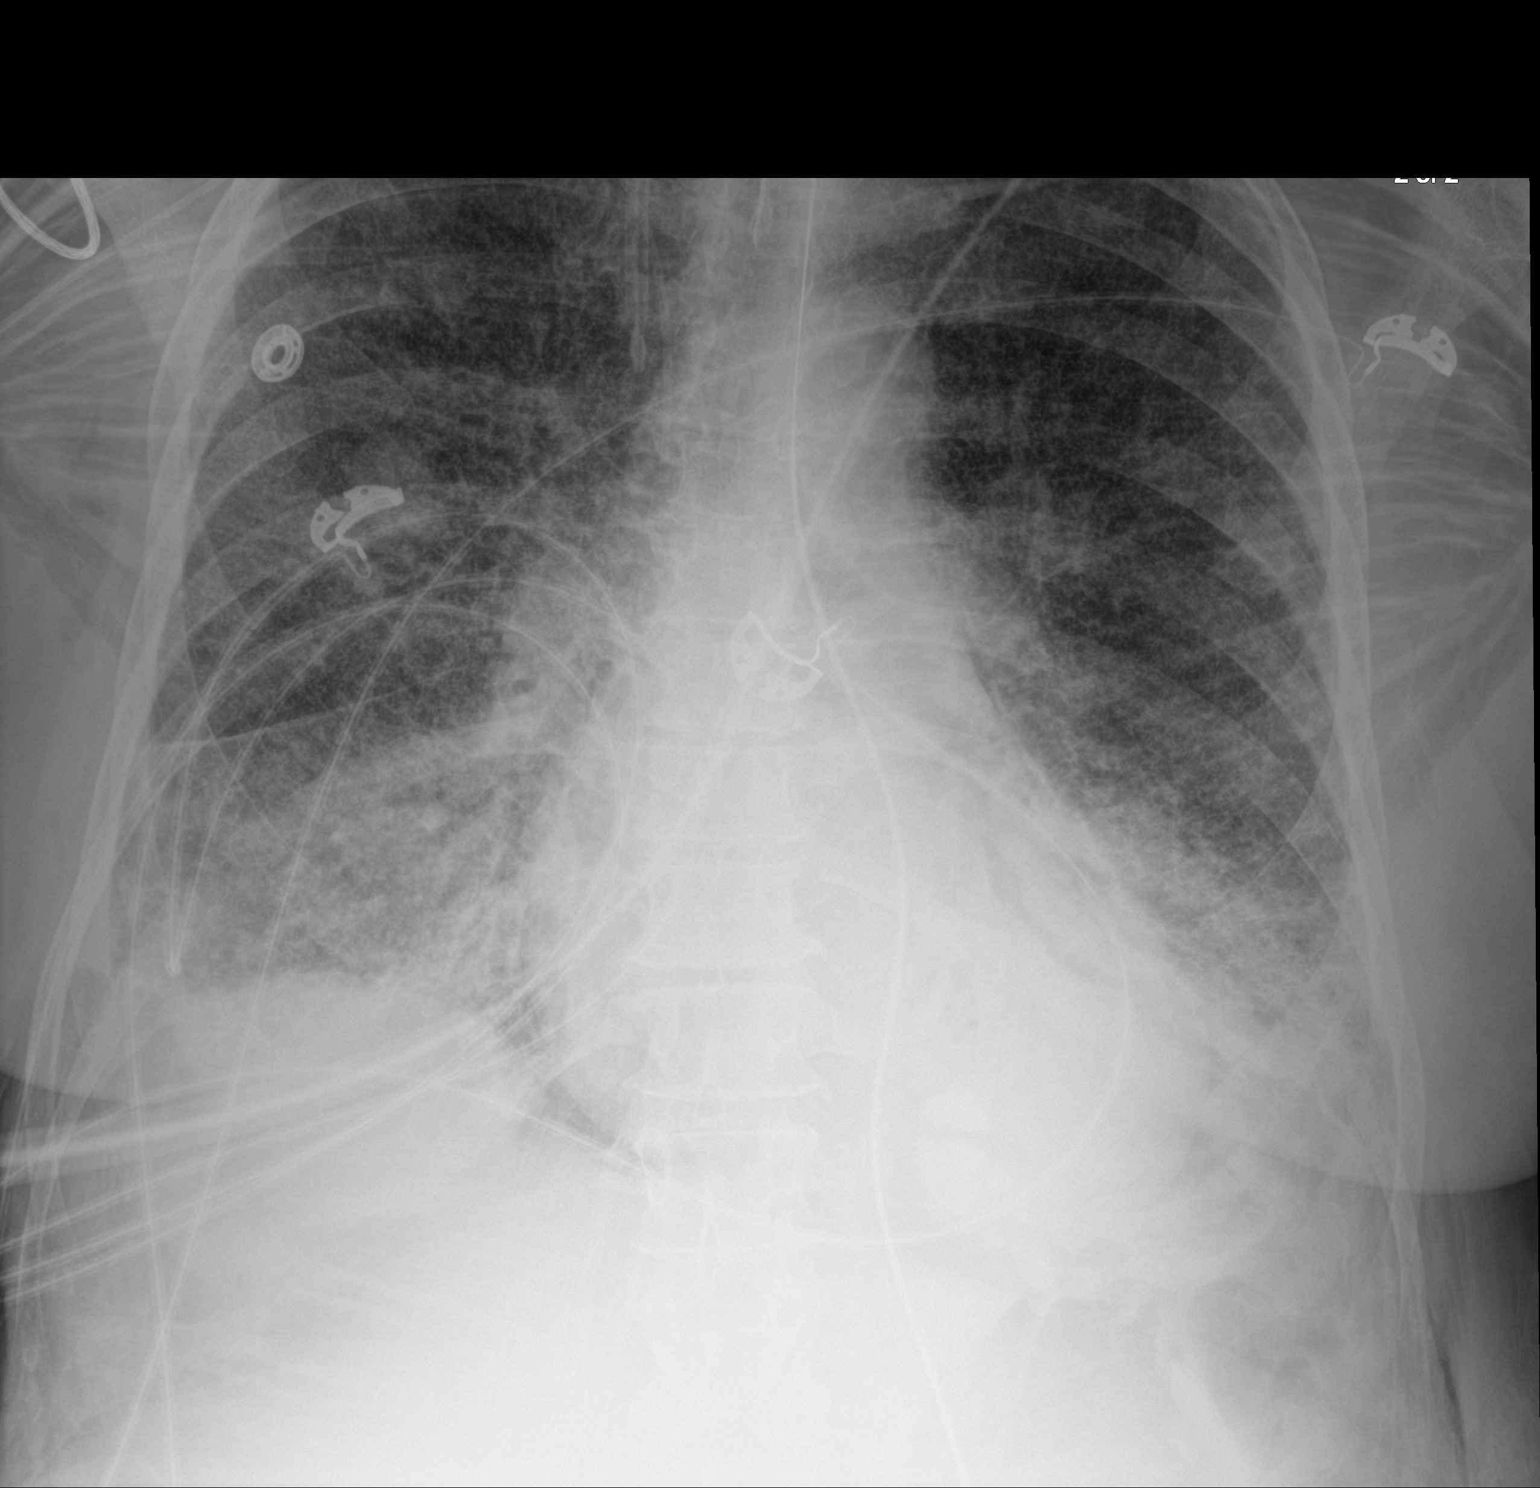
[im 2/2]
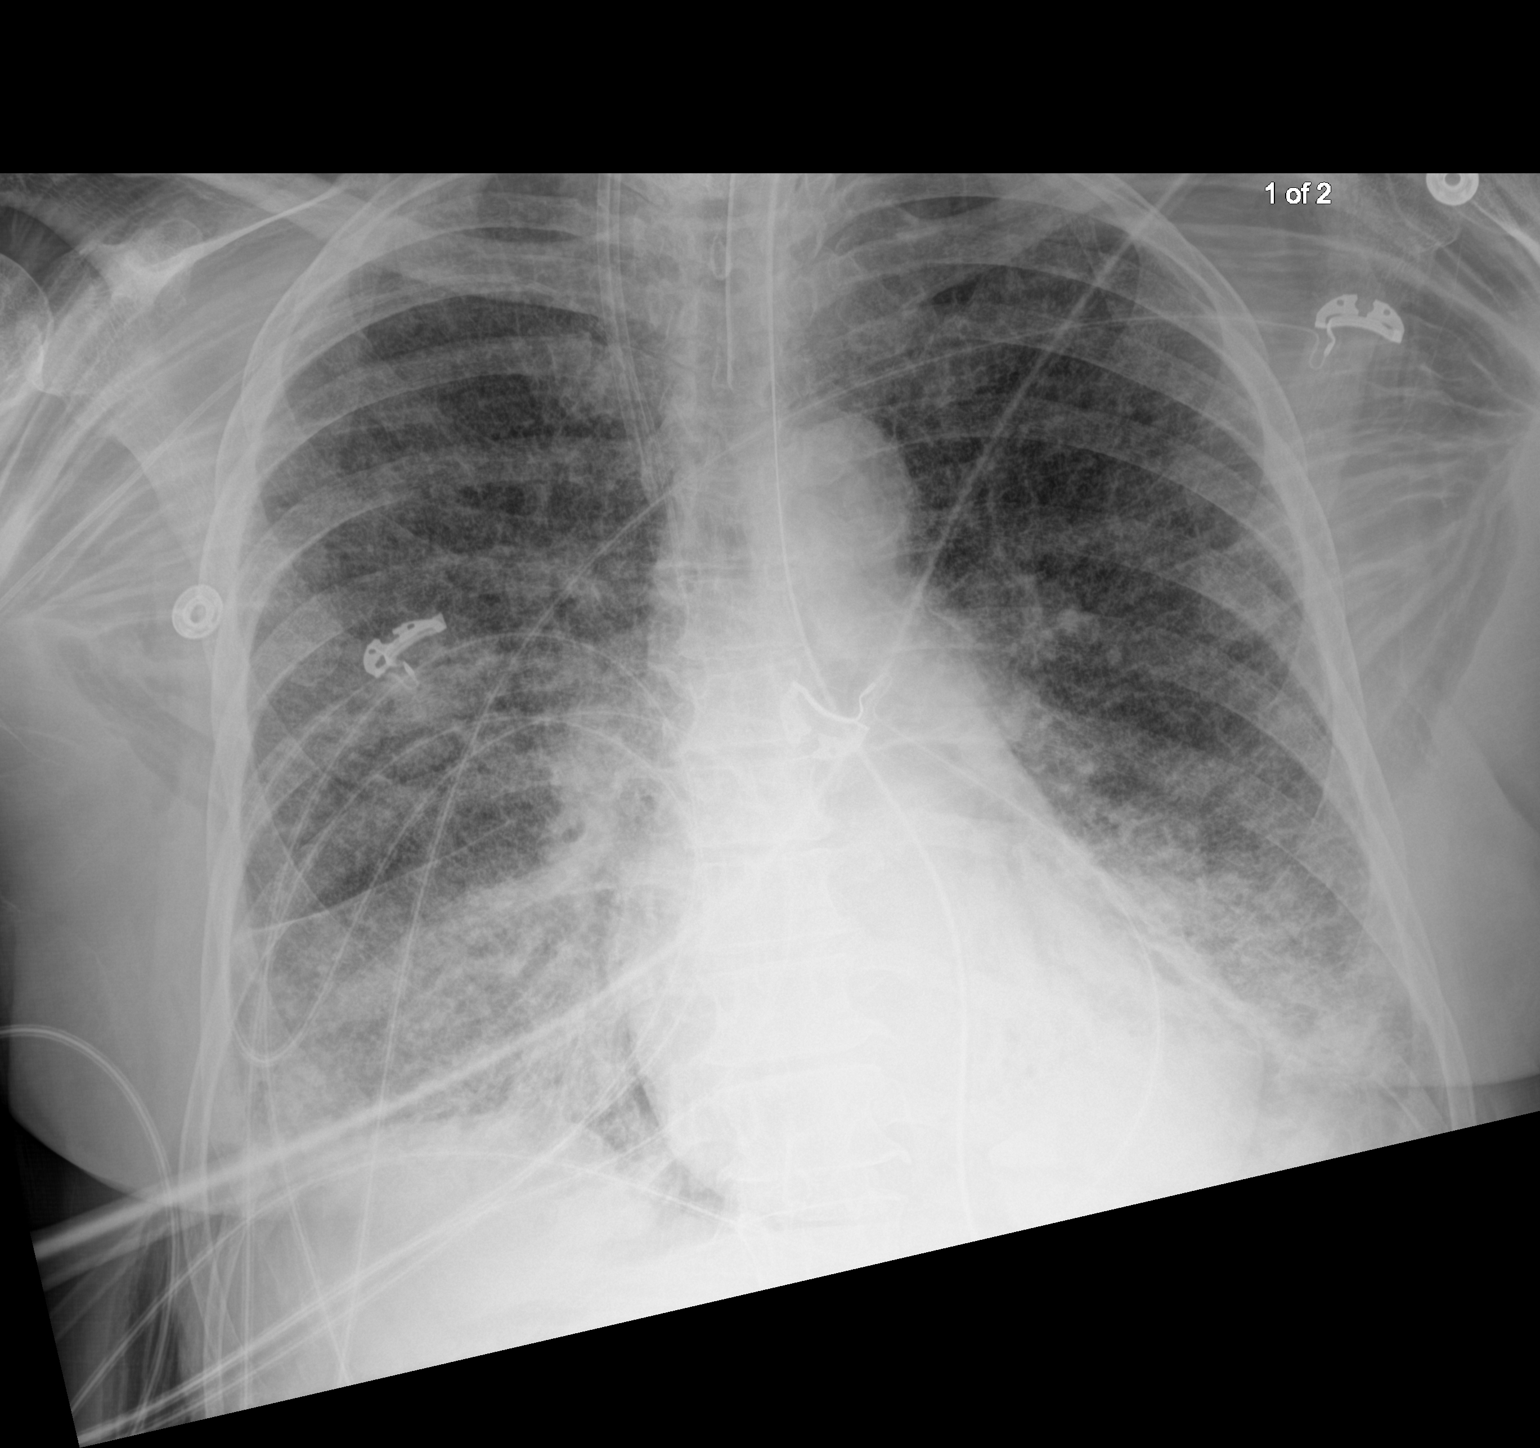

[2 of 2 positions shown; findings below may reference images not displayed]

FINDINGS: An endotracheal tube is in place with tip 5.5 cm above the carina.
Right IJ central venous catheters with tips terminating in the
proximal in mid superior vena cava. Lung volumes appear normal.
Widespread patchy ill-defined opacities and areas of interstitial
prominence are noted throughout the mid to lower lungs bilaterally,
concerning for multilobar pneumonia. Possible small bilateral
pleural effusions. Pulmonary vasculature is obscured. No
pneumothorax. Aortic atherosclerosis. Heart size is normal. Upper
mediastinal contours are within normal limits. Extensive
subcutaneous emphysema throughout the chest wall and right cervical
region.
IMPRESSION: 1. Support apparatus, as above.
2. Severe multilobar bilateral pneumonia compatible with known COVID
infection.
3. Possible small bilateral pleural effusions.
4. Aortic atherosclerosis.
5. Extensive subcutaneous emphysema redemonstrated.

## 2021-06-17 IMAGING — DX DG CHEST 1V PORT
1 series · 1 of 1 positions shown · non-contrast
Comparison: 12/16/2020.

CLINICAL DATA: COVID.  Respiratory distress.

EXAM:
PORTABLE CHEST 1 VIEW

[chest ap]
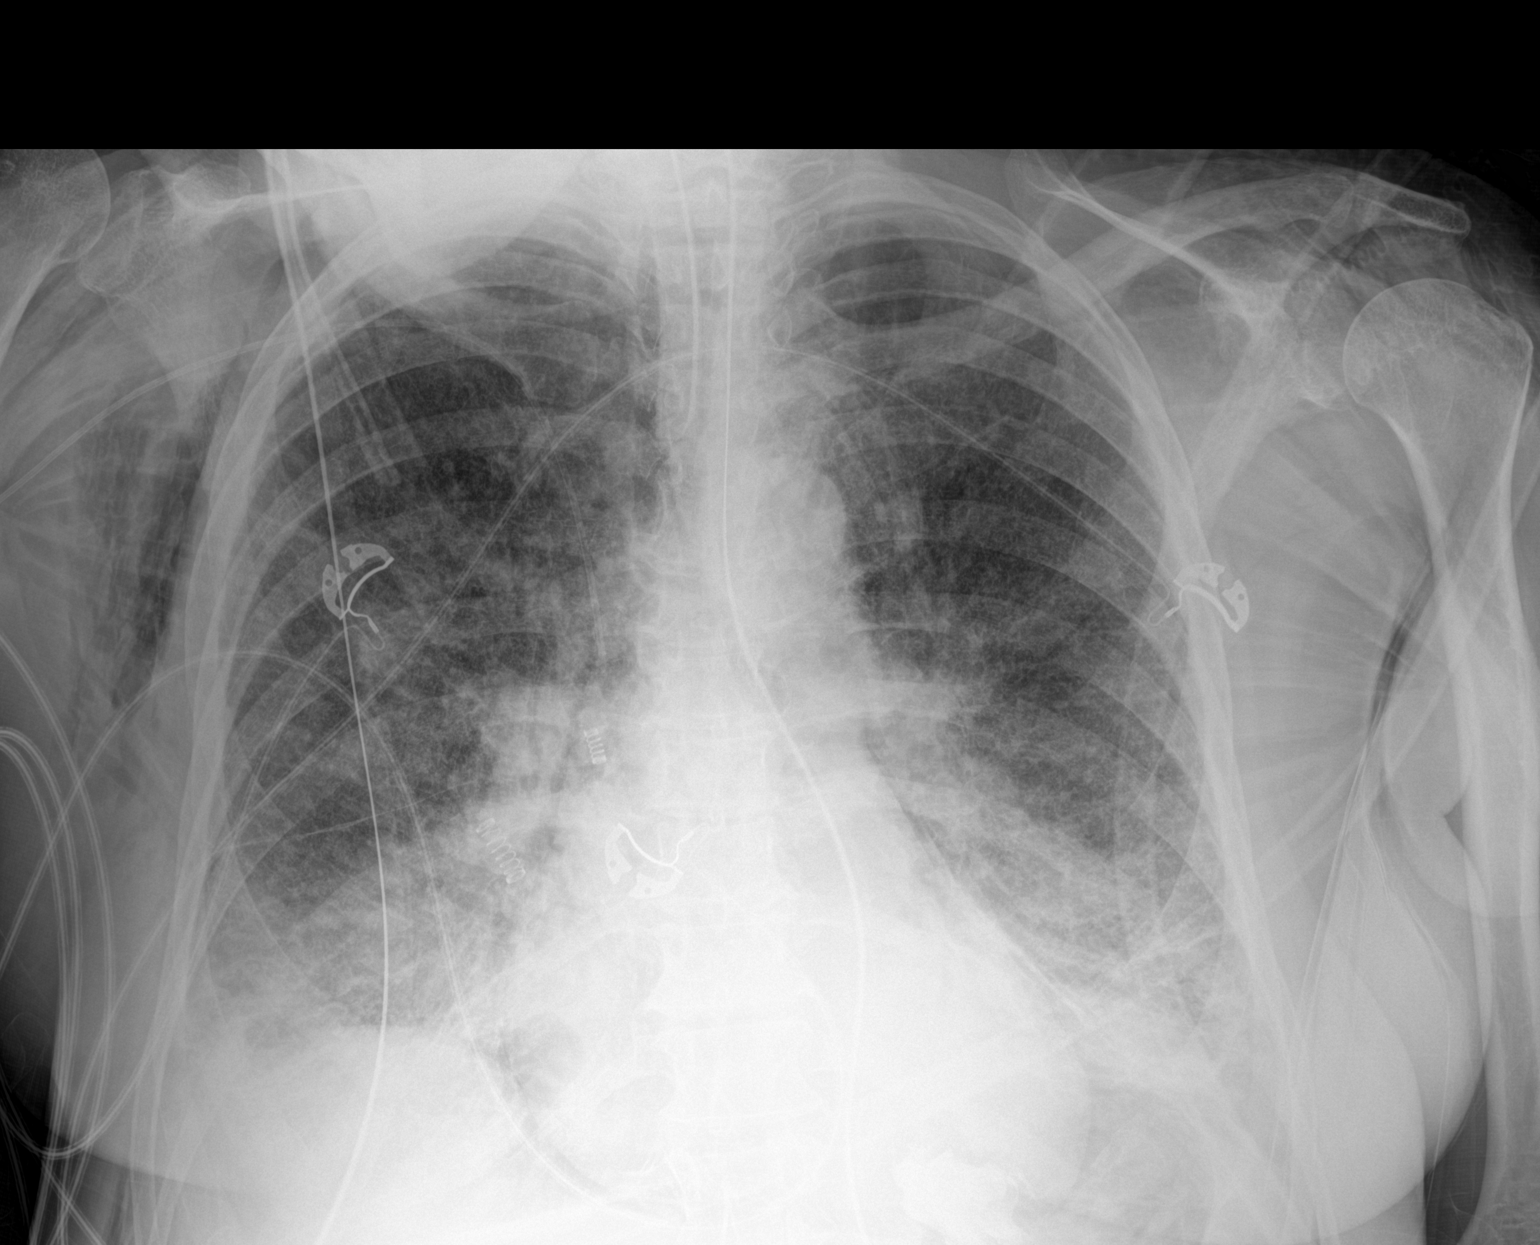

[1 of 1 positions shown; findings below may reference images not displayed]

FINDINGS: Endotracheal tube, NG tube, right PICC line in stable position.
Pneumomediastinum/pneumopericardium again noted. Heart size stable.
Low lung volumes. Diffuse bilateral pulmonary infiltrates are again
noted without interim change. Tiny bilateral pleural effusions
cannot be excluded. No pneumothorax. Diffuse chest wall subcutaneous
emphysema again noted.
IMPRESSION: 1. Lines and tubes in stable position.
2. Pneumomediastinum/pneumopericardium again noted. Diffuse chest
wall subcutaneous emphysema again noted. No pneumothorax noted.
3. Low lung volumes. Diffuse bilateral pulmonary infiltrates again
noted without interim change.

## 2021-06-18 IMAGING — DX DG CHEST 1V PORT
2 series · 2 of 2 positions shown · non-contrast
Comparison: 12/17/2020

CLINICAL DATA: [id number] pneumonia

EXAM:
PORTABLE CHEST 1 VIEW

[chest ap (1 of 2)]
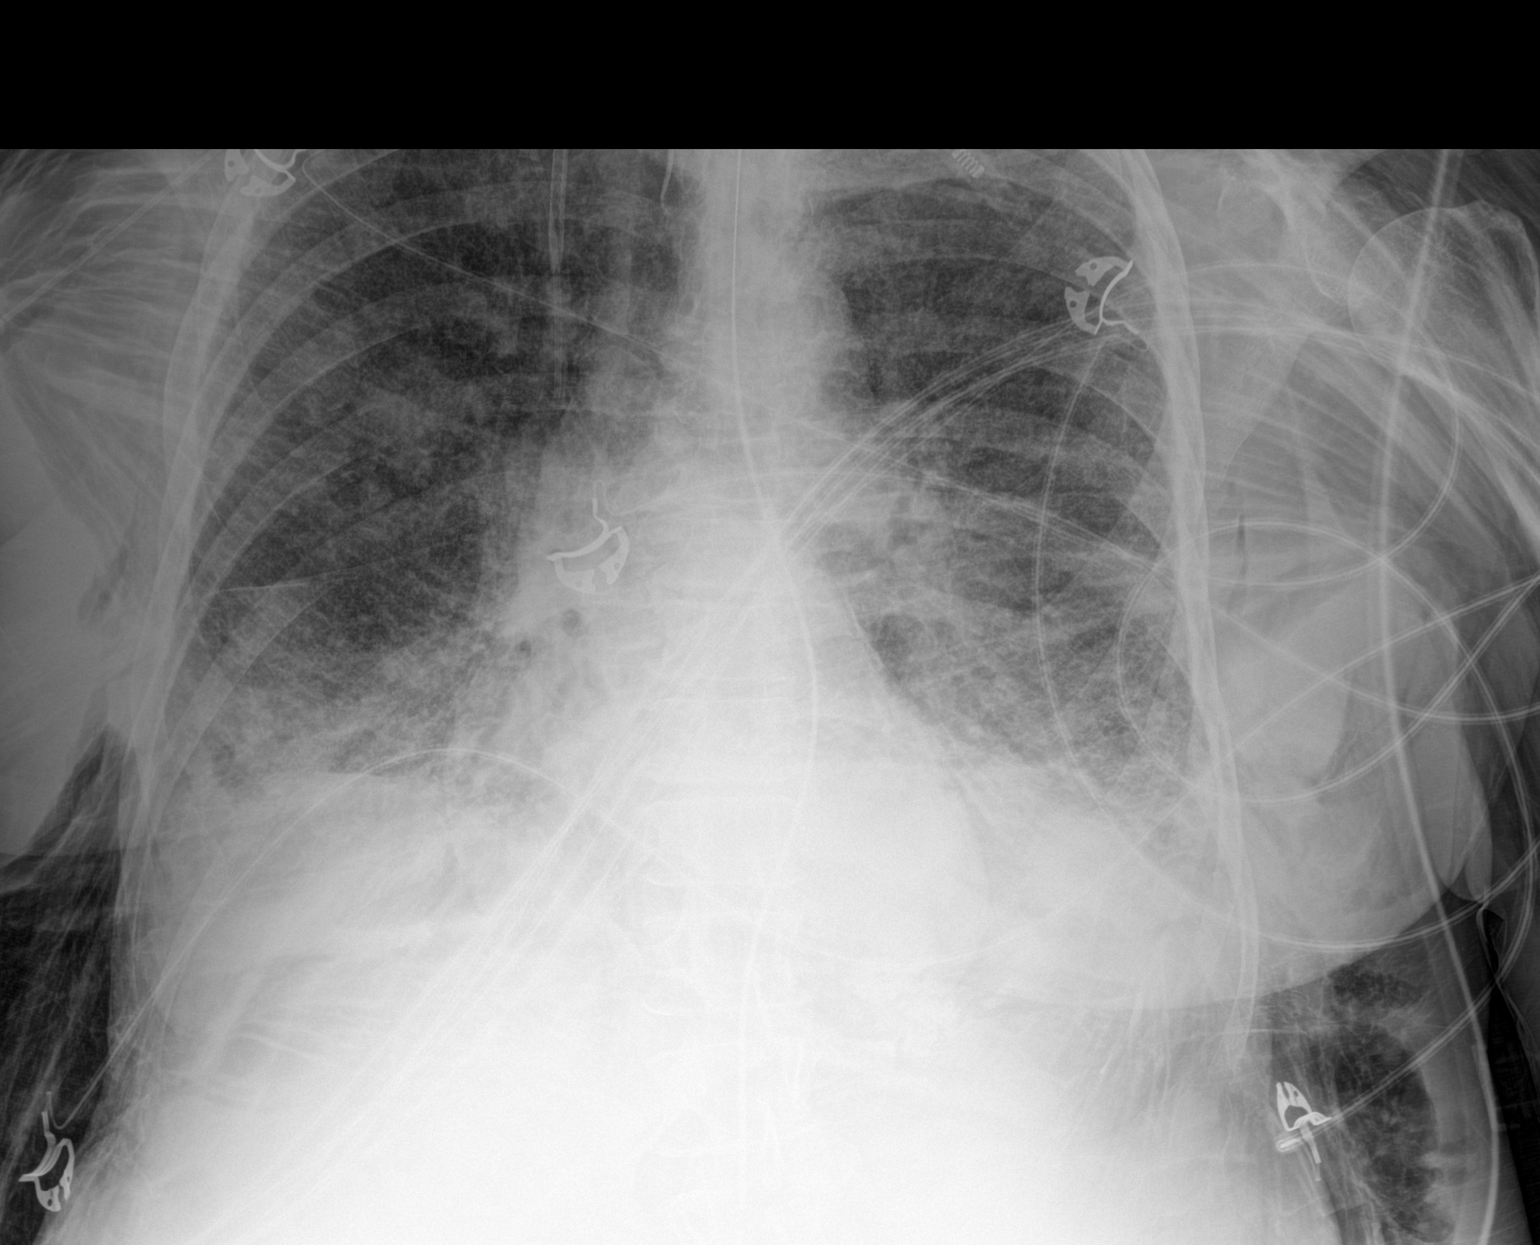

[chest ap (2 of 2)]
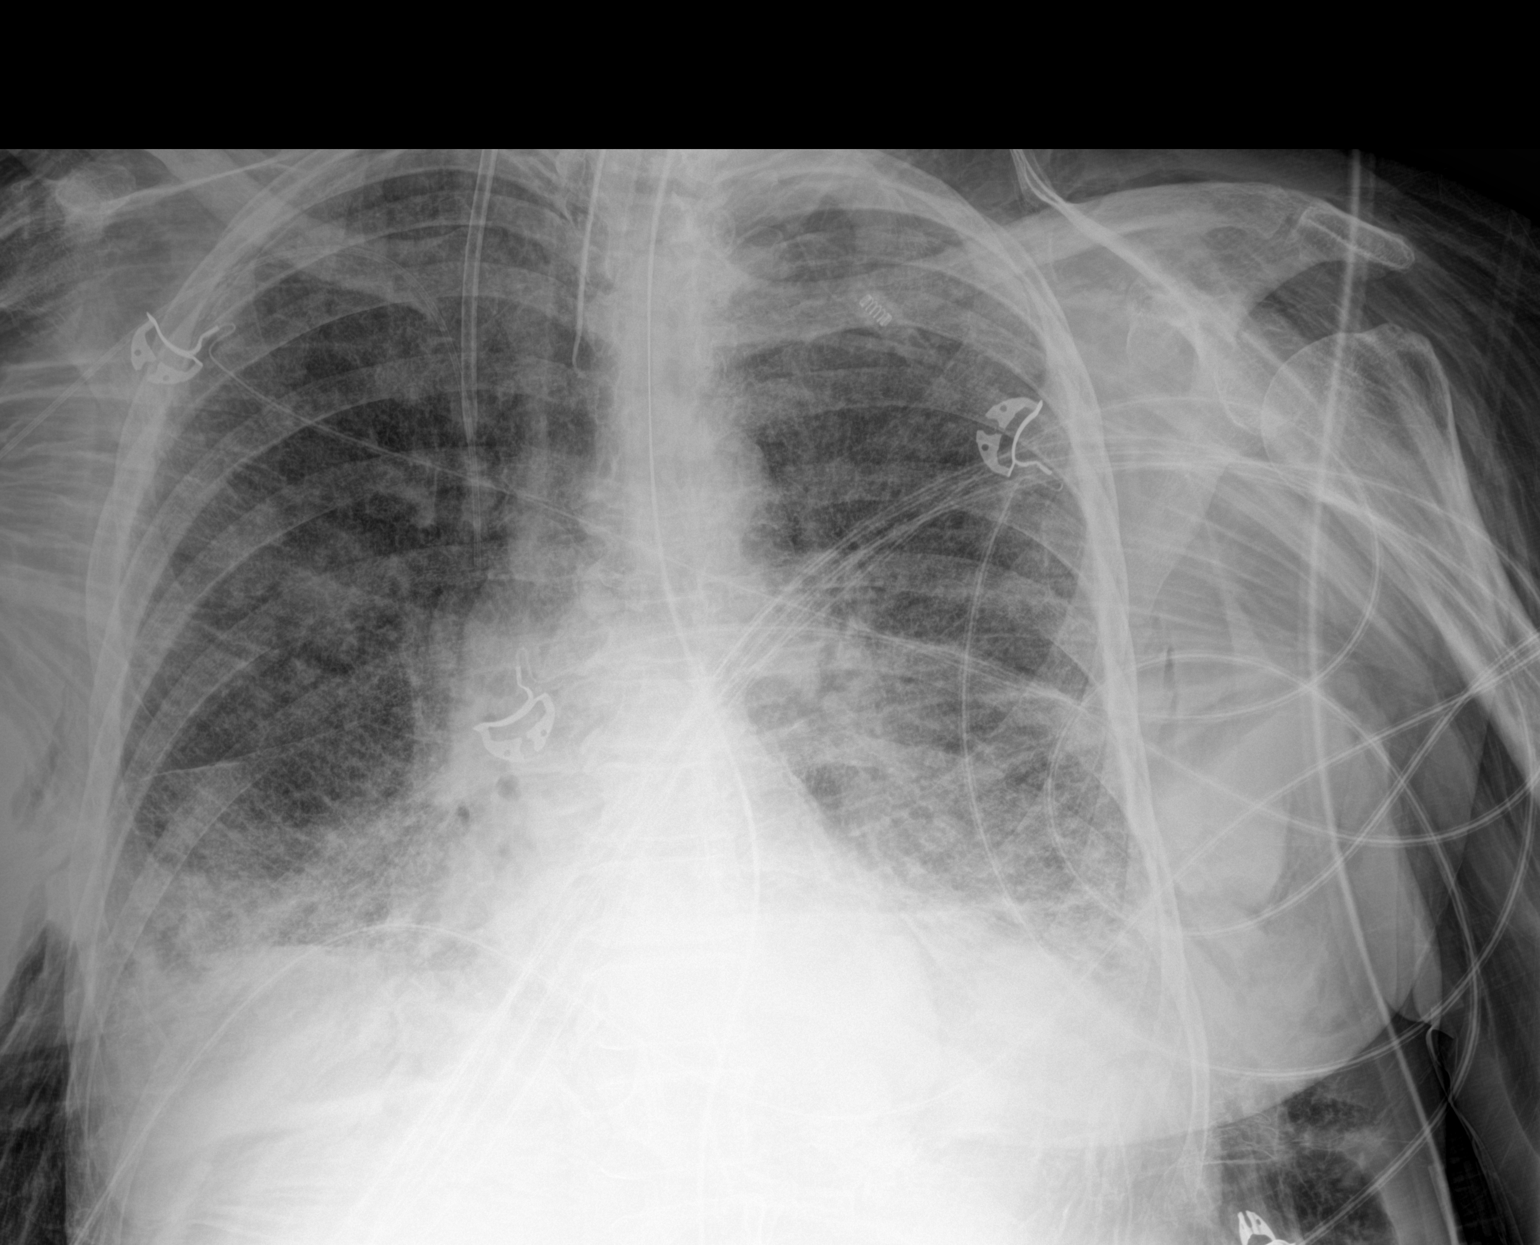

[2 of 2 positions shown; findings below may reference images not displayed]

FINDINGS: ET tube distal tip terminates 4.6 cm above the carina. Enteric tube
courses below the diaphragm with distal tip beyond the inferior
margin of the film. Right IJ central venous catheter terminates at
the level of the mid SVC. Right-sided PICC line in stable
positioning. Mixed patchy and interstitial opacities throughout both
lungs, most pronounced within the lung bases. Suspect small
bilateral pleural effusions. Overall, little interval change from
prior. No pneumothorax identified. Extensive chest wall subcutaneous
emphysema.
IMPRESSION: 1. No significant interval change. Mixed patchy and interstitial
opacities throughout both lungs, most pronounced within the lung
bases.
2. Extensive chest wall subcutaneous emphysema.
3. Stable support apparatus.
